# Patient Record
Sex: Female | Born: 1965 | Race: Black or African American | Hispanic: No | Smoking: Never smoker
Health system: Southern US, Community
[De-identification: ages and names within clinical notes are randomized; demographics above are authoritative.]

## PROBLEM LIST (undated history)

## (undated) DIAGNOSIS — R011 Cardiac murmur, unspecified: Secondary | ICD-10-CM

## (undated) DIAGNOSIS — N6452 Nipple discharge: Secondary | ICD-10-CM

## (undated) DIAGNOSIS — I10 Essential (primary) hypertension: Secondary | ICD-10-CM

## (undated) DIAGNOSIS — C50919 Malignant neoplasm of unspecified site of unspecified female breast: Secondary | ICD-10-CM

## (undated) DIAGNOSIS — E119 Type 2 diabetes mellitus without complications: Secondary | ICD-10-CM

## (undated) DIAGNOSIS — L509 Urticaria, unspecified: Secondary | ICD-10-CM

## (undated) DIAGNOSIS — J301 Allergic rhinitis due to pollen: Secondary | ICD-10-CM

## (undated) DIAGNOSIS — E7800 Pure hypercholesterolemia, unspecified: Secondary | ICD-10-CM

## (undated) HISTORY — DX: Pure hypercholesterolemia, unspecified: E78.00

## (undated) HISTORY — DX: Essential (primary) hypertension: I10

## (undated) HISTORY — DX: Type 2 diabetes mellitus without complications: E11.9

## (undated) HISTORY — DX: Allergic rhinitis due to pollen: J30.1

## (undated) HISTORY — DX: Urticaria, unspecified: L50.9

## (undated) HISTORY — PX: WISDOM TOOTH EXTRACTION: SHX21

## (undated) HISTORY — DX: Malignant neoplasm of unspecified site of unspecified female breast: C50.919

---

## 1997-08-19 ENCOUNTER — Inpatient Hospital Stay (HOSPITAL_COMMUNITY): Admission: AD | Admit: 1997-08-19 | Discharge: 1997-08-24 | Payer: Self-pay | Admitting: Obstetrics and Gynecology

## 1998-11-08 ENCOUNTER — Other Ambulatory Visit: Admission: RE | Admit: 1998-11-08 | Discharge: 1998-11-08 | Payer: Self-pay | Admitting: Obstetrics & Gynecology

## 1999-11-12 ENCOUNTER — Other Ambulatory Visit: Admission: RE | Admit: 1999-11-12 | Discharge: 1999-11-12 | Payer: Self-pay | Admitting: Obstetrics & Gynecology

## 2000-11-28 ENCOUNTER — Other Ambulatory Visit: Admission: RE | Admit: 2000-11-28 | Discharge: 2000-11-28 | Payer: Self-pay | Admitting: Obstetrics & Gynecology

## 2001-12-16 ENCOUNTER — Other Ambulatory Visit: Admission: RE | Admit: 2001-12-16 | Discharge: 2001-12-16 | Payer: Self-pay | Admitting: Obstetrics & Gynecology

## 2002-12-24 ENCOUNTER — Other Ambulatory Visit: Admission: RE | Admit: 2002-12-24 | Discharge: 2002-12-24 | Payer: Self-pay | Admitting: Obstetrics & Gynecology

## 2004-01-16 ENCOUNTER — Other Ambulatory Visit: Admission: RE | Admit: 2004-01-16 | Discharge: 2004-01-16 | Payer: Self-pay | Admitting: Obstetrics & Gynecology

## 2005-01-16 ENCOUNTER — Other Ambulatory Visit: Admission: RE | Admit: 2005-01-16 | Discharge: 2005-01-16 | Payer: Self-pay | Admitting: Obstetrics & Gynecology

## 2014-07-07 ENCOUNTER — Ambulatory Visit: Payer: Self-pay

## 2014-07-14 ENCOUNTER — Ambulatory Visit: Payer: Self-pay

## 2014-07-21 ENCOUNTER — Ambulatory Visit: Payer: Self-pay

## 2014-07-28 ENCOUNTER — Ambulatory Visit: Payer: Self-pay

## 2014-08-04 ENCOUNTER — Ambulatory Visit: Payer: Self-pay

## 2014-08-09 ENCOUNTER — Encounter: Payer: BC Managed Care – PPO | Attending: Family Medicine

## 2014-08-09 VITALS — Ht 66.5 in | Wt 156.6 lb

## 2014-08-09 DIAGNOSIS — Z713 Dietary counseling and surveillance: Secondary | ICD-10-CM | POA: Diagnosis not present

## 2014-08-09 DIAGNOSIS — IMO0002 Reserved for concepts with insufficient information to code with codable children: Secondary | ICD-10-CM

## 2014-08-09 DIAGNOSIS — E1165 Type 2 diabetes mellitus with hyperglycemia: Secondary | ICD-10-CM

## 2014-08-09 DIAGNOSIS — E118 Type 2 diabetes mellitus with unspecified complications: Secondary | ICD-10-CM | POA: Diagnosis not present

## 2014-08-09 NOTE — Progress Notes (Signed)

## 2014-08-11 ENCOUNTER — Ambulatory Visit: Payer: Self-pay

## 2014-08-16 DIAGNOSIS — IMO0002 Reserved for concepts with insufficient information to code with codable children: Secondary | ICD-10-CM

## 2014-08-16 DIAGNOSIS — E118 Type 2 diabetes mellitus with unspecified complications: Secondary | ICD-10-CM | POA: Diagnosis not present

## 2014-08-16 DIAGNOSIS — E1165 Type 2 diabetes mellitus with hyperglycemia: Secondary | ICD-10-CM

## 2014-08-16 NOTE — Progress Notes (Signed)

## 2014-08-23 DIAGNOSIS — E118 Type 2 diabetes mellitus with unspecified complications: Principal | ICD-10-CM

## 2014-08-23 DIAGNOSIS — IMO0002 Reserved for concepts with insufficient information to code with codable children: Secondary | ICD-10-CM

## 2014-08-23 DIAGNOSIS — E1165 Type 2 diabetes mellitus with hyperglycemia: Secondary | ICD-10-CM

## 2014-08-23 NOTE — Progress Notes (Signed)
Patient was seen on 08/23/14 for the third of a series of three diabetes self-management courses at the [facility].   Kari Torres the amount of activity recommended for healthy living . Describe activities suitable for individual needs . Identify ways to regularly incorporate activity into daily life . Identify barriers to activity and ways to over come these barriers  Identify diabetes medications being personally used and their primary action for lowering glucose and possible side effects . Describe role of stress on blood glucose and develop strategies to address psychosocial issues . Identify diabetes complications and ways to prevent them  Explain how to manage diabetes during illness . Evaluate success in meeting personal goal . Establish 2-3 goals that they will plan to diligently work on until they return for the  3-month follow-up visit  Goals:   I will be active 30 minutes or more 4 times a week  I will eat less unhealthy fats by eating less trans fats two or more meals a day  Your patient has identified these potential barriers to change:  None stated  Your patient has identified their diabetes self-care support plan as  Family Support On-line Resources Plan:  Attend Core 4 in 4 months

## 2014-08-30 ENCOUNTER — Ambulatory Visit: Payer: Self-pay

## 2015-06-22 DIAGNOSIS — I10 Essential (primary) hypertension: Secondary | ICD-10-CM

## 2015-06-22 DIAGNOSIS — E7800 Pure hypercholesterolemia, unspecified: Secondary | ICD-10-CM

## 2015-06-22 DIAGNOSIS — J301 Allergic rhinitis due to pollen: Secondary | ICD-10-CM | POA: Insufficient documentation

## 2015-06-22 DIAGNOSIS — IMO0001 Reserved for inherently not codable concepts without codable children: Secondary | ICD-10-CM | POA: Insufficient documentation

## 2015-06-22 DIAGNOSIS — E1165 Type 2 diabetes mellitus with hyperglycemia: Secondary | ICD-10-CM

## 2015-06-22 DIAGNOSIS — R079 Chest pain, unspecified: Secondary | ICD-10-CM | POA: Insufficient documentation

## 2015-06-23 ENCOUNTER — Ambulatory Visit (INDEPENDENT_AMBULATORY_CARE_PROVIDER_SITE_OTHER): Payer: BC Managed Care – PPO | Admitting: Cardiovascular Disease

## 2015-06-23 ENCOUNTER — Encounter: Payer: Self-pay | Admitting: Cardiovascular Disease

## 2015-06-23 VITALS — BP 126/100 | HR 60 | Ht 66.5 in | Wt 168.8 lb

## 2015-06-23 DIAGNOSIS — I10 Essential (primary) hypertension: Secondary | ICD-10-CM | POA: Diagnosis not present

## 2015-06-23 DIAGNOSIS — E7800 Pure hypercholesterolemia, unspecified: Secondary | ICD-10-CM | POA: Diagnosis not present

## 2015-06-23 DIAGNOSIS — R0789 Other chest pain: Secondary | ICD-10-CM | POA: Diagnosis not present

## 2015-06-23 DIAGNOSIS — E785 Hyperlipidemia, unspecified: Secondary | ICD-10-CM | POA: Diagnosis not present

## 2015-06-23 NOTE — Progress Notes (Signed)
Cardiology Office Note   Date:  06/23/2015   ID:  Kari Torres, DOB [birth year], MRN [id number]  PCP:  Donnie Coffin, MD  Cardiologist:   Acie Fredrickson Wonda Cheng, MD   Chief Complaint  Patient presents with  . New Patient (Initial Visit)    CHEST PAIN HAS BEEN BETTER, NO SOB AND NO SWELLING. RECENT EKG IN NOTES.   Problem list 1. Left chest/shoulder pain 2. Diabetes mellitus 3. Essential hypertension 4. Mild mitral regurgitation 5. Hyperlipidemia   History of Present Illness: Kari Torres is a 50 y.o. female who presents for evaluation of chest pain  Hx of DM 3-4 weeks of left shoulder pain  Burning,  Squeezing sensation . Not associated with dyspnea.  May radiate through to her back , possible pain in left upper arm  Throbbing up into her neck.   Does not get worse with exercise. Last week became more constant.  Does not seem to be related to movement   Typically exercises regularly , has not exercised this week due to the pain . 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 Diabetes mellitus without complication ([place])   . Hypertension   . Allergic rhinitis due to pollen   . Chest pain   . Hypercholesterolemia     Past Surgical History  Procedure Laterality Date  . Cesarean section    . Wisdom tooth extraction       Current Outpatient Prescriptions  Medication Sig Dispense Refill  . aspirin 81 MG tablet Take 81 mg by mouth daily.    Marland Kitchen atenolol (TENORMIN) 100 MG tablet Take 100 mg by mouth daily.    Marland Kitchen atorvastatin (LIPITOR) 20 MG tablet Take 20 mg by mouth daily.    Marland Kitchen conjugated estrogens (PREMARIN) vaginal cream Place 1 Applicatorful vaginally once a week.    . diltiazem (DILACOR XR) 240 MG 24 hr capsule Take 240 mg by mouth daily.    . hydrochlorothiazide (HYDRODIURIL) 25 MG tablet Take 25 mg by mouth daily.    Marland Kitchen levonorgestrel (MIRENA) 20 MCG/24HR IUD 1 each by Intrauterine route once.    .  loratadine (CLARITIN) 10 MG tablet Take 10 mg by mouth daily as needed for allergies.    . metFORMIN (GLUCOPHAGE) 500 MG tablet Take 500 mg by mouth 2 (two) times daily with a meal.    . Multiple Vitamins-Minerals (CENTRUM ADULTS PO) Take 1 tablet by mouth daily.     No current facility-administered medications for this visit.    Allergies:   Food    Social History:  The patient  reports that she has never smoked. She has never used smokeless tobacco. She reports that she drinks alcohol. She reports that she does not use illicit drugs.   Family History:  The patient's family history includes Breast cancer in her mother; CAD in her father; Congestive Heart Failure in her maternal grandmother; Diabetes in her father, maternal grandmother, paternal grandfather, and paternal grandmother; Healthy in her brother, brother, brother, brother, and sister; Hypertension in her father, maternal grandmother, and mother.    ROS:  Please see the history of present illness.    Review of Systems: Constitutional:  denies fever, chills, diaphoresis, appetite change and fatigue.  HEENT: denies photophobia, eye pain, redness, hearing loss, ear pain, congestion, sore throat, rhinorrhea, sneezing, neck pain, neck stiffness and tinnitus.  Respiratory: denies SOB, DOE, cough, chest tightness, and wheezing.  Cardiovascular: admits to chest pain and palpitations  , denies  leg swelling.  Gastrointestinal: denies nausea, vomiting, abdominal pain, diarrhea, constipation, blood in stool.  Genitourinary: denies dysuria, urgency, frequency, hematuria, flank pain and difficulty urinating.  Musculoskeletal: denies  myalgias, back pain, joint swelling, arthralgias and gait problem.   Skin: denies pallor, rash and wound.  Neurological: denies dizziness, seizures, syncope, weakness, light-headedness, numbness and headaches.   Hematological: denies adenopathy, easy bruising, personal or family bleeding history.   Psychiatric/ Behavioral: denies suicidal ideation, mood changes, confusion, nervousness, sleep disturbance and agitation.       All other systems are reviewed and negative.    PHYSICAL EXAM: VS:  BP 126/100 mmHg  Pulse 60  Ht 5' 6.5" (1.689 m)  Wt 168 lb 12.8 oz (76.567 kg)  BMI 26.84 kg/m2 , BMI Body mass index is 26.84 kg/(m^2). GEN: Well nourished, well developed, in no acute distress HEENT: normal Neck: no JVD, carotid bruits, or masses Cardiac: RRR; no murmurs, rubs, or gallops,no edema  Respiratory:  clear to auscultation bilaterally, normal work of breathing GI: soft, nontender, nondistended, + BS MS: no deformity or atrophy Skin: warm and dry, no rash Neuro:  Strength and sensation are intact Psych: normal   EKG:  EKG is not ordered today. The ekg ordered 06/19/15 at Dr. Virgilio Belling office  demonstrates NSR at 54.   No ST or T wave changes.    Recent Labs: No results found for requested labs within last 365 days.    Lipid Panel No results found for: CHOL, TRIG, HDL, CHOLHDL, VLDL, LDLCALC, LDLDIRECT    Wt Readings from Last 3 Encounters:  06/23/15 168 lb 12.8 oz (76.567 kg)  08/09/14 156 lb 9.6 oz (71.033 kg)      Other studies Reviewed: Additional studies/ records that were reviewed today include: . Review of the above records demonstrates:    ASSESSMENT AND PLAN:  1.  Chest pain: Kari Torres presents with some episodes of chest discomfort. The episodes have some typical and atypical features. She describes them as a tightness or squeezing sensation. It occurs at various times and seems to be worsening this past week. It does not radiate and it is not associate with shortness of breath. She has several risk factors for coronary artery disease including hyperlipidemia, hypertension, and type 2 diabetes mellitus. Given these risk factors and her chest discomfort I think that we should proceed with a stress Myoview study. If the Myoview study is abnormal then we  will need to proceed with cardiac catheterization. If the Myoview study is normal, I will see her on an as-needed basis.   Current medicines are reviewed at length with the patient today.  The patient does not have concerns regarding medicines.  The following changes have been made:  no change  Labs/ tests ordered today include:  No orders of the defined types were placed in this encounter.     Disposition:   FU with me as needed.      Kari Torres, Wonda Cheng, MD  06/23/2015 10:10 AM    [facility] [address], [place], [place]  [address] Phone: [phone]; Fax: [phone]   [facility]  [address] [place], [place]  [address] [phone]   Fax [phone]

## 2015-06-23 NOTE — Patient Instructions (Signed)
Medication Instructions:  Your physician recommends that you continue on your current medications as directed. Please refer to the Current Medication list given to you today.   Labwork: None Ordered   Testing/Procedures: Your physician has requested that you have an exercise stress myoview. For further information please visit [email or web address]. Please follow instruction sheet, as given.   Follow-Up: Your physician recommends that you schedule a follow-up appointment in: as needed with Dr. Nahser.    If you need a refill on your cardiac medications before your next appointment, please call your pharmacy.   Thank you for choosing [facility]! Caedence Snowden, RN [phone]    

## 2015-07-14 ENCOUNTER — Encounter (HOSPITAL_COMMUNITY): Payer: BC Managed Care – PPO

## 2015-07-17 ENCOUNTER — Telehealth (HOSPITAL_COMMUNITY): Payer: Self-pay | Admitting: *Deleted

## 2015-07-17 ENCOUNTER — Other Ambulatory Visit: Payer: Self-pay | Admitting: Radiology

## 2015-07-17 NOTE — Telephone Encounter (Signed)
Left message on voicemail in reference to upcoming appointment scheduled for 07/19/15. Phone number given for a call back so details instructions can be given. Kari Torres, [name]

## 2015-07-19 ENCOUNTER — Encounter (HOSPITAL_COMMUNITY): Payer: BC Managed Care – PPO

## 2015-07-20 ENCOUNTER — Other Ambulatory Visit: Payer: Self-pay | Admitting: General Surgery

## 2015-07-20 DIAGNOSIS — N6320 Unspecified lump in the left breast, unspecified quadrant: Secondary | ICD-10-CM

## 2015-07-25 ENCOUNTER — Telehealth (HOSPITAL_COMMUNITY): Payer: Self-pay | Admitting: *Deleted

## 2015-07-25 NOTE — Telephone Encounter (Signed)
Left message on voicemail in reference to upcoming appointment scheduled for 07/28/15. Phone number given for a call back so details instructions can be given. Hubbard Robinson, RN

## 2015-07-28 ENCOUNTER — Ambulatory Visit (HOSPITAL_COMMUNITY): Payer: BC Managed Care – PPO | Attending: Cardiology

## 2015-07-28 ENCOUNTER — Encounter (HOSPITAL_COMMUNITY): Payer: BC Managed Care – PPO

## 2015-07-28 DIAGNOSIS — R0789 Other chest pain: Secondary | ICD-10-CM | POA: Insufficient documentation

## 2015-07-28 DIAGNOSIS — R9439 Abnormal result of other cardiovascular function study: Secondary | ICD-10-CM | POA: Insufficient documentation

## 2015-07-28 DIAGNOSIS — I10 Essential (primary) hypertension: Secondary | ICD-10-CM | POA: Diagnosis not present

## 2015-07-28 DIAGNOSIS — E119 Type 2 diabetes mellitus without complications: Secondary | ICD-10-CM | POA: Insufficient documentation

## 2015-07-28 LAB — MYOCARDIAL PERFUSION IMAGING
CHL CUP MPHR: 171 {beats}/min
CHL CUP NUCLEAR SDS: 1
CHL CUP RESTING HR STRESS: 67 {beats}/min
CSEPED: 10 min
CSEPEDS: 40 s
CSEPEW: 12.8 METS
LV sys vol: 44 mL
LVDIAVOL: 103 mL (ref 46–106)
Peak HR: 162 {beats}/min
Percent HR: 94 %
RATE: 0.11
SRS: 2
SSS: 3
TID: 1.08

## 2015-07-28 MED ORDER — TECHNETIUM TC 99M SESTAMIBI GENERIC - CARDIOLITE
32.3000 | Freq: Once | INTRAVENOUS | Status: AC | PRN
  Administered 2015-07-28: 32.3 via INTRAVENOUS

## 2015-07-28 MED ORDER — TECHNETIUM TC 99M SESTAMIBI GENERIC - CARDIOLITE
10.2000 | Freq: Once | INTRAVENOUS | Status: AC | PRN
  Administered 2015-07-28: 10 via INTRAVENOUS

## 2015-08-02 ENCOUNTER — Encounter (HOSPITAL_COMMUNITY): Payer: BC Managed Care – PPO

## 2015-09-07 ENCOUNTER — Encounter (HOSPITAL_BASED_OUTPATIENT_CLINIC_OR_DEPARTMENT_OTHER): Payer: Self-pay | Admitting: *Deleted

## 2015-09-08 ENCOUNTER — Encounter (HOSPITAL_BASED_OUTPATIENT_CLINIC_OR_DEPARTMENT_OTHER)
Admission: RE | Admit: 2015-09-08 | Discharge: 2015-09-08 | Disposition: A | Discharge status: Home / Self Care | Payer: BC Managed Care – PPO | Source: Ambulatory Visit | Attending: General Surgery | Admitting: General Surgery

## 2015-09-08 DIAGNOSIS — I10 Essential (primary) hypertension: Secondary | ICD-10-CM | POA: Insufficient documentation

## 2015-09-08 DIAGNOSIS — Z01812 Encounter for preprocedural laboratory examination: Secondary | ICD-10-CM | POA: Diagnosis not present

## 2015-09-08 DIAGNOSIS — E119 Type 2 diabetes mellitus without complications: Secondary | ICD-10-CM | POA: Insufficient documentation

## 2015-09-08 LAB — BASIC METABOLIC PANEL
ANION GAP: 8 (ref 5–15)
BUN: 12 mg/dL (ref 6–20)
CALCIUM: 10.2 mg/dL (ref 8.9–10.3)
CO2: 29 mmol/L (ref 22–32)
CREATININE: 0.97 mg/dL (ref 0.44–1.00)
Chloride: 100 mmol/L — ABNORMAL LOW (ref 101–111)
Glucose, Bld: 129 mg/dL — ABNORMAL HIGH (ref 65–99)
Potassium: 4.2 mmol/L (ref 3.5–5.1)
SODIUM: 137 mmol/L (ref 135–145)

## 2015-09-14 NOTE — H&P (Signed)
Kari Torres  Location: [facility] Patient #: [phone] DOB: 03-May-1965 Married / Language: English / Race: Black or African American Female     History: Patient words: left breast eval.  The patient is a 50 year old female who presents with a breast mass. This is a 50 year old female, referred to me by Dr. Marcelo Baldy at [facility] mammography for surgical evaluation of complex sclerosing lesion, possible radial scar left breast, 1 o'clock position. Her PCP is Dr. Donnie Coffin. Her cardiologist is Dr. Liam Rogers.  The patient has had almost no breast problems in the past, a couple of cysts have been aspirated. She has no breast pain, proceed mass, or nipple discharge. She has been followed because of the calcifications in the left breast upper outer quadrant. Dr. Joanell Rising thought these were more prominent lately and she underwent image guided biopsy of the left breast calcifications at 1:00. Final pathology shows complex sclerosing lesion, fibrocystic changes, adenosis, possible radial scar.  Family history reveals breast cancer in her mother at age 3. She presented with stage IV disease and died a year and a half later. No other breast or ovarian or pancreatic cancer in the family.  Coronaries include hypertension, diabetes, mild mitral regurgitation, hyperlipidemia, and recent evaluation for atypical left chest pain. She says Dr. nausea. This was bursitis but is getting a Myoview on May 10  Social -  she is married her husband is with her they have 2 children no tobacco she is an Optometrist for [employer].  We had a lengthy discussion. She would like this area excised because of the low but definite risk of in situ cancer and more IMPORTANTLY because her mother died of breast cancer. I think that is very reasonable and we are going to proceed once we get cardiac clearance. I discussed the indications, details, techniques, and numerous risk of left breast lumpectomy with  radioactive seed localization. She understands the risk of bleeding, infection, nerve damage chronic pain, cosmetic deformity, reoperation if this is cancer, and other unforeseen problems. She understands all these issues. All of her questions are answered. She agrees with this plan.   Other Problems  Diabetes Mellitus Heart murmur Hemorrhoids High blood pressure  Past Surgical History  Breast Biopsy Left. Cesarean Section - 1 Oral Surgery  Diagnostic Studies History Colonoscopy never Mammogram within last year Pap Smear 1-5 years ago  Allergies  Shellfish-derived Products  Medication History  Atenolol (100MG  Tablet, Oral) Active. Atorvastatin Calcium (20MG  Tablet, Oral) Active. DiltiaZEM HCl ER Coated Beads (240MG  Capsule ER 24HR, Oral) Active. HydroCHLOROthiazide (25MG  Tablet, Oral) Active. MetFORMIN HCl (500MG  Tablet, Oral) Active. Premarin (0.625MG /GM Cream, Vaginal) Active. Triamcinolone Acetonide (0.1% Ointment, External) Active. Medications Reconciled   Social History  Alcohol use Moderate alcohol use. Caffeine use Coffee. No drug use Tobacco use Never smoker.  Family History  Breast Cancer Mother. Diabetes Mellitus Father. Heart Disease Father. Heart disease in female family member before age 40 Hypertension Father, Mother. Kidney Disease Father.  Pregnancy / Birth History  Age at menarche 84 years. Age of menopause 80-50 Contraceptive History Intrauterine device, Oral contraceptives. Gravida 2 Irregular periods Maternal age 78-30 Para 2    Review of Systems  General Present- Night Sweats. Not Present- Appetite Loss, Chills, Fatigue, Fever, Weight Gain and Weight Loss. Skin Not Present- Change in Wart/Mole, Dryness, Hives, Jaundice, New Lesions, Non-Healing Wounds, Rash and Ulcer. HEENT Present- Seasonal Allergies and Wears glasses/contact lenses. Not Present- Earache, Hearing Loss, Hoarseness, Nose Bleed, Oral  Ulcers, Ringing in the Ears, Sinus Pain, Sore Throat, Visual Disturbances and Yellow Eyes. Respiratory Not Present- Bloody sputum, Chronic Cough, Difficulty Breathing, Snoring and Wheezing. Breast Not Present- Breast Mass, Breast Pain, Nipple Discharge and Skin Changes. Cardiovascular Not Present- Chest Pain, Difficulty Breathing Lying Down, Leg Cramps, Palpitations, Rapid Heart Rate, Shortness of Breath and Swelling of Extremities. Gastrointestinal Not Present- Abdominal Pain, Bloating, Bloody Stool, Change in Bowel Habits, Chronic diarrhea, Constipation, Difficulty Swallowing, Excessive gas, Gets full quickly at meals, Hemorrhoids, Indigestion, Nausea, Rectal Pain and Vomiting. Female Genitourinary Not Present- Frequency, Nocturia, Painful Urination, Pelvic Pain and Urgency. Musculoskeletal Not Present- Back Pain, Joint Pain, Joint Stiffness, Muscle Pain, Muscle Weakness and Swelling of Extremities. Neurological Not Present- Decreased Memory, Fainting, Headaches, Numbness, Seizures, Tingling, Tremor, Trouble walking and Weakness. Psychiatric Not Present- Anxiety, Bipolar, Change in Sleep Pattern, Depression, Fearful and Frequent crying. Endocrine Present- Hot flashes and New Diabetes. Not Present- Cold Intolerance, Excessive Hunger, Hair Changes and Heat Intolerance. Hematology Not Present- Easy Bruising, Excessive bleeding, Gland problems, HIV and Persistent Infections.  Vitals  Weight: 166 lb Height: 66in Body Surface Area: 1.85 m Body Mass Index: 26.79 kg/m  Temp.: 97.29F(Temporal)  Pulse: 76 (Regular)  BP: 128/80 (Sitting, Left Arm, Standard)    Physical Exam  General Mental Status-Alert. General Appearance-Consistent with stated age. Hydration-Well hydrated. Voice-Normal.  Head and Neck Head-normocephalic, atraumatic with no lesions or palpable masses. Trachea-midline. Thyroid Gland Characteristics - normal size and consistency.  Eye Eyeball -  Bilateral-Extraocular movements intact. Sclera/Conjunctiva - Bilateral-No scleral icterus.  Chest and Lung Exam Chest and lung exam reveals -quiet, even and easy respiratory effort with no use of accessory muscles and on auscultation, normal breath sounds, no adventitious sounds and normal vocal resonance. Inspection Chest Wall - Normal. Back - normal.  Breast Note: Both breasts are examined. Medium size. Soft. Skin healthy without changes. Biopsy site superior left breast without hematoma or ecchymoses. No palpable masses in either breast. No axillary or supraclavicular adenopathy.   Cardiovascular Cardiovascular examination reveals -normal heart sounds, regular rate and rhythm with no murmurs and normal pedal pulses bilaterally.  Abdomen Inspection Inspection of the abdomen reveals - No Hernias. Palpation/Percussion Palpation and Percussion of the abdomen reveal - Soft, Non Tender, No Rebound tenderness, No Rigidity (guarding) and No hepatosplenomegaly. Auscultation Auscultation of the abdomen reveals - Bowel sounds normal. Note: Pfannenstiel incision from previous C-section.   Neurologic Neurologic evaluation reveals -alert and oriented x 3 with no impairment of recent or remote memory. Mental Status-Normal.  Musculoskeletal Normal Exam - Left-Upper Extremity Strength Normal and Lower Extremity Strength Normal. Normal Exam - Right-Upper Extremity Strength Normal and Lower Extremity Strength Normal.  Lymphatic Head & Neck  General Head & Neck Lymphatics: Bilateral - Description - Normal. Axillary  General Axillary Region: Bilateral - Description - Normal. Tenderness - Non Tender. Femoral & Inguinal  Generalized Femoral & Inguinal Lymphatics: Bilateral - Description - Normal. Tenderness - Non Tender.    Assessment & Plan  MASS OF LEFT BREAST ON MAMMOGRAM (N63)   Your recent imaging studies and biopsy showed a complex sclerosing lesion, possible  radial scar You were referred for a surgical opinion because there is a low but definite risk of in situ cancer. Probably about 5% Because of your family history of breast cancer in your mother, we have decided to go ahead with a conservative lumpectomy to be sure there is not something more suspicious in the breast I have discussed indications, techniques, and numerous risk of the  surgery with you and your husband  We will schedule this surgery once we know the results of YOUR stress Myoview test on May 10.  FAMILY HISTORY OF BREAST CANCER IN FIRST DEGREE RELATIVE (Z80.3) TYPE 2 DIABETES MELLITUS TREATED WITHOUT INSULIN (E11.9) ESSENTIAL HYPERTENSION (I10) ATYPICAL CHEST PAIN (R07.89) Impression: Has seen Dr. nausea. Felt to be atypical. Stress Myoview scheduled for May 10.   Edsel Petrin. Dalbert Batman, M.D., [facility] General and Minimally invasive Surgery Breast and Colorectal Surgery Office:   [phone] Pager:   [phone]

## 2015-09-15 ENCOUNTER — Ambulatory Visit (HOSPITAL_BASED_OUTPATIENT_CLINIC_OR_DEPARTMENT_OTHER)
Admission: RE | Admit: 2015-09-15 | Discharge: 2015-09-15 | Disposition: A | Discharge status: Home / Self Care | Payer: BC Managed Care – PPO | Source: Ambulatory Visit | Attending: General Surgery | Admitting: General Surgery

## 2015-09-15 ENCOUNTER — Encounter (HOSPITAL_BASED_OUTPATIENT_CLINIC_OR_DEPARTMENT_OTHER)
Admission: RE | Disposition: A | Discharge status: Home / Self Care | Payer: Self-pay | Source: Ambulatory Visit | Attending: General Surgery

## 2015-09-15 ENCOUNTER — Ambulatory Visit (HOSPITAL_BASED_OUTPATIENT_CLINIC_OR_DEPARTMENT_OTHER): Payer: BC Managed Care – PPO | Admitting: Anesthesiology

## 2015-09-15 ENCOUNTER — Encounter (HOSPITAL_BASED_OUTPATIENT_CLINIC_OR_DEPARTMENT_OTHER): Payer: Self-pay | Admitting: Anesthesiology

## 2015-09-15 DIAGNOSIS — R928 Other abnormal and inconclusive findings on diagnostic imaging of breast: Secondary | ICD-10-CM

## 2015-09-15 DIAGNOSIS — Z79899 Other long term (current) drug therapy: Secondary | ICD-10-CM | POA: Insufficient documentation

## 2015-09-15 DIAGNOSIS — R011 Cardiac murmur, unspecified: Secondary | ICD-10-CM | POA: Insufficient documentation

## 2015-09-15 DIAGNOSIS — E119 Type 2 diabetes mellitus without complications: Secondary | ICD-10-CM | POA: Insufficient documentation

## 2015-09-15 DIAGNOSIS — N6012 Diffuse cystic mastopathy of left breast: Secondary | ICD-10-CM | POA: Diagnosis not present

## 2015-09-15 DIAGNOSIS — I10 Essential (primary) hypertension: Secondary | ICD-10-CM | POA: Insufficient documentation

## 2015-09-15 DIAGNOSIS — N6032 Fibrosclerosis of left breast: Secondary | ICD-10-CM | POA: Insufficient documentation

## 2015-09-15 DIAGNOSIS — Z7984 Long term (current) use of oral hypoglycemic drugs: Secondary | ICD-10-CM | POA: Insufficient documentation

## 2015-09-15 DIAGNOSIS — E785 Hyperlipidemia, unspecified: Secondary | ICD-10-CM | POA: Insufficient documentation

## 2015-09-15 DIAGNOSIS — I340 Nonrheumatic mitral (valve) insufficiency: Secondary | ICD-10-CM | POA: Insufficient documentation

## 2015-09-15 DIAGNOSIS — D242 Benign neoplasm of left breast: Secondary | ICD-10-CM | POA: Diagnosis not present

## 2015-09-15 DIAGNOSIS — Z91013 Allergy to seafood: Secondary | ICD-10-CM | POA: Diagnosis not present

## 2015-09-15 DIAGNOSIS — N6022 Fibroadenosis of left breast: Secondary | ICD-10-CM | POA: Diagnosis not present

## 2015-09-15 DIAGNOSIS — Z803 Family history of malignant neoplasm of breast: Secondary | ICD-10-CM | POA: Insufficient documentation

## 2015-09-15 HISTORY — PX: BREAST LUMPECTOMY WITH RADIOACTIVE SEED LOCALIZATION: SHX6424

## 2015-09-15 LAB — GLUCOSE, CAPILLARY
GLUCOSE-CAPILLARY: 150 mg/dL — AB (ref 65–99)
Glucose-Capillary: 136 mg/dL — ABNORMAL HIGH (ref 65–99)

## 2015-09-15 SURGERY — BREAST LUMPECTOMY WITH RADIOACTIVE SEED LOCALIZATION
Anesthesia: General | Site: Breast | Laterality: Left

## 2015-09-15 MED ORDER — CHLORHEXIDINE GLUCONATE 4 % EX LIQD: 1.0000 "application " | Freq: Once | CUTANEOUS | Status: DC

## 2015-09-15 MED ORDER — FENTANYL CITRATE (PF) 100 MCG/2ML IJ SOLN
50.0000 ug | INTRAMUSCULAR | Status: AC | PRN
  Administered 2015-09-15 (×2): 25 ug via INTRAVENOUS
  Administered 2015-09-15: 100 ug via INTRAVENOUS

## 2015-09-15 MED ORDER — LACTATED RINGERS IV SOLN
INTRAVENOUS | Status: DC
  Administered 2015-09-15 (×2): via INTRAVENOUS

## 2015-09-15 MED ORDER — LIDOCAINE 2% (20 MG/ML) 5 ML SYRINGE: INTRAMUSCULAR | Status: DC | PRN

## 2015-09-15 MED ORDER — LIDOCAINE 2% (20 MG/ML) 5 ML SYRINGE
INTRAMUSCULAR | Status: AC
  Filled 2015-09-15: qty 5

## 2015-09-15 MED ORDER — MIDAZOLAM HCL 2 MG/2ML IJ SOLN
1.0000 mg | INTRAMUSCULAR | Status: DC | PRN
  Administered 2015-09-15: 2 mg via INTRAVENOUS

## 2015-09-15 MED ORDER — ONDANSETRON HCL 4 MG/2ML IJ SOLN
INTRAMUSCULAR | Status: DC | PRN
  Administered 2015-09-15: 4 mg via INTRAVENOUS

## 2015-09-15 MED ORDER — MIDAZOLAM HCL 2 MG/2ML IJ SOLN
INTRAMUSCULAR | Status: AC
  Filled 2015-09-15: qty 2

## 2015-09-15 MED ORDER — CEFAZOLIN SODIUM-DEXTROSE 2-4 GM/100ML-% IV SOLN
2.0000 g | INTRAVENOUS | Status: AC
  Administered 2015-09-15: 2 g via INTRAVENOUS

## 2015-09-15 MED ORDER — CEFAZOLIN SODIUM-DEXTROSE 2-4 GM/100ML-% IV SOLN
INTRAVENOUS | Status: AC
  Filled 2015-09-15: qty 100

## 2015-09-15 MED ORDER — SCOPOLAMINE 1 MG/3DAYS TD PT72
1.0000 | MEDICATED_PATCH | Freq: Once | TRANSDERMAL | Status: DC | PRN
Start: 2015-09-15 — End: 2015-09-15

## 2015-09-15 MED ORDER — MEPERIDINE HCL 25 MG/ML IJ SOLN: 6.2500 mg | INTRAMUSCULAR | Status: DC | PRN

## 2015-09-15 MED ORDER — ONDANSETRON HCL 4 MG/2ML IJ SOLN
INTRAMUSCULAR | Status: AC
  Filled 2015-09-15: qty 2

## 2015-09-15 MED ORDER — PROPOFOL 500 MG/50ML IV EMUL
INTRAVENOUS | Status: AC
  Filled 2015-09-15: qty 50

## 2015-09-15 MED ORDER — DEXAMETHASONE SODIUM PHOSPHATE 4 MG/ML IJ SOLN
INTRAMUSCULAR | Status: DC | PRN
  Administered 2015-09-15: 5 mg via INTRAVENOUS

## 2015-09-15 MED ORDER — BUPIVACAINE-EPINEPHRINE (PF) 0.5% -1:200000 IJ SOLN
INTRAMUSCULAR | Status: DC | PRN
  Administered 2015-09-15: 10 mL

## 2015-09-15 MED ORDER — FENTANYL CITRATE (PF) 100 MCG/2ML IJ SOLN: 25.0000 ug | INTRAMUSCULAR | Status: DC | PRN

## 2015-09-15 MED ORDER — PROPOFOL 10 MG/ML IV BOLUS
INTRAVENOUS | Status: DC | PRN
  Administered 2015-09-15: 130 mg via INTRAVENOUS

## 2015-09-15 MED ORDER — HYDROCODONE-ACETAMINOPHEN 5-325 MG PO TABS: 1.0000 | ORAL_TABLET | Freq: Four times a day (QID) | ORAL | Status: DC | PRN

## 2015-09-15 MED ORDER — EPHEDRINE SULFATE 50 MG/ML IJ SOLN
INTRAMUSCULAR | Status: DC | PRN
  Administered 2015-09-15: 10 mg via INTRAVENOUS

## 2015-09-15 MED ORDER — LIDOCAINE HCL (CARDIAC) 20 MG/ML IV SOLN
INTRAVENOUS | Status: DC | PRN
  Administered 2015-09-15: 40 mg via INTRAVENOUS

## 2015-09-15 MED ORDER — FENTANYL CITRATE (PF) 100 MCG/2ML IJ SOLN
INTRAMUSCULAR | Status: AC
  Filled 2015-09-15: qty 2

## 2015-09-15 MED ORDER — PROMETHAZINE HCL 25 MG/ML IJ SOLN: 6.2500 mg | INTRAMUSCULAR | Status: DC | PRN

## 2015-09-15 MED ORDER — OXYCODONE HCL 5 MG PO TABS
5.0000 mg | ORAL_TABLET | Freq: Once | ORAL | Status: AC
  Administered 2015-09-15: 5 mg via ORAL

## 2015-09-15 MED ORDER — GLYCOPYRROLATE 0.2 MG/ML IJ SOLN: 0.2000 mg | Freq: Once | INTRAMUSCULAR | Status: DC | PRN

## 2015-09-15 MED ORDER — OXYCODONE HCL 5 MG PO TABS
ORAL_TABLET | ORAL | Status: AC
  Filled 2015-09-15: qty 1

## 2015-09-15 MED ORDER — BUPIVACAINE-EPINEPHRINE (PF) 0.5% -1:200000 IJ SOLN
INTRAMUSCULAR | Status: AC
  Filled 2015-09-15: qty 90

## 2015-09-15 MED ORDER — DEXAMETHASONE SODIUM PHOSPHATE 10 MG/ML IJ SOLN
INTRAMUSCULAR | Status: AC
  Filled 2015-09-15: qty 1

## 2015-09-15 MED ORDER — MIDAZOLAM HCL 2 MG/2ML IJ SOLN: 0.5000 mg | Freq: Once | INTRAMUSCULAR | Status: DC | PRN

## 2015-09-15 SURGICAL SUPPLY — 65 items
APPLIER CLIP 9.375 MED OPEN (MISCELLANEOUS) ×3
BENZOIN TINCTURE PRP APPL 2/3 (GAUZE/BANDAGES/DRESSINGS) IMPLANT
BINDER BREAST LRG (GAUZE/BANDAGES/DRESSINGS) ×3 IMPLANT
BINDER BREAST MEDIUM (GAUZE/BANDAGES/DRESSINGS) IMPLANT
BINDER BREAST XLRG (GAUZE/BANDAGES/DRESSINGS) IMPLANT
BINDER BREAST XXLRG (GAUZE/BANDAGES/DRESSINGS) IMPLANT
BLADE HEX COATED 2.75 (ELECTRODE) ×3 IMPLANT
BLADE SURG 10 STRL SS (BLADE) IMPLANT
BLADE SURG 15 STRL LF DISP TIS (BLADE) ×1 IMPLANT
BLADE SURG 15 STRL SS (BLADE) ×2
CANISTER SUC SOCK COL 7IN (MISCELLANEOUS) IMPLANT
CANISTER SUCT 1200ML W/VALVE (MISCELLANEOUS) ×3 IMPLANT
CHLORAPREP W/TINT 26ML (MISCELLANEOUS) ×3 IMPLANT
CLIP APPLIE 9.375 MED OPEN (MISCELLANEOUS) ×1 IMPLANT
CLOSURE WOUND 1/2 X4 (GAUZE/BANDAGES/DRESSINGS)
COVER BACK TABLE 60X90IN (DRAPES) ×3 IMPLANT
COVER MAYO STAND STRL (DRAPES) ×3 IMPLANT
COVER PROBE W GEL 5X96 (DRAPES) ×3 IMPLANT
DECANTER SPIKE VIAL GLASS SM (MISCELLANEOUS) IMPLANT
DERMABOND ADVANCED (GAUZE/BANDAGES/DRESSINGS) ×2
DERMABOND ADVANCED .7 DNX12 (GAUZE/BANDAGES/DRESSINGS) ×1 IMPLANT
DEVICE DUBIN W/COMP PLATE 8390 (MISCELLANEOUS) ×3 IMPLANT
DRAPE LAPAROSCOPIC ABDOMINAL (DRAPES) ×3 IMPLANT
DRAPE UTILITY XL STRL (DRAPES) ×3 IMPLANT
DRSG PAD ABDOMINAL 8X10 ST (GAUZE/BANDAGES/DRESSINGS) ×3 IMPLANT
ELECT REM PT RETURN 9FT ADLT (ELECTROSURGICAL) ×3
ELECTRODE REM PT RTRN 9FT ADLT (ELECTROSURGICAL) ×1 IMPLANT
GLOVE BIO SURGEON STRL SZ7 (GLOVE) ×3 IMPLANT
GLOVE BIOGEL PI IND STRL 7.0 (GLOVE) ×1 IMPLANT
GLOVE BIOGEL PI IND STRL 7.5 (GLOVE) ×2 IMPLANT
GLOVE BIOGEL PI INDICATOR 7.0 (GLOVE) ×2
GLOVE BIOGEL PI INDICATOR 7.5 (GLOVE) ×4
GLOVE EUDERMIC 7 POWDERFREE (GLOVE) ×3 IMPLANT
GLOVE SURG SS PI 7.5 STRL IVOR (GLOVE) ×3 IMPLANT
GOWN STRL REUS W/ TWL LRG LVL3 (GOWN DISPOSABLE) ×1 IMPLANT
GOWN STRL REUS W/ TWL XL LVL3 (GOWN DISPOSABLE) ×2 IMPLANT
GOWN STRL REUS W/TWL LRG LVL3 (GOWN DISPOSABLE) ×2
GOWN STRL REUS W/TWL XL LVL3 (GOWN DISPOSABLE) ×4
ILLUMINATOR WAVEGUIDE N/F (MISCELLANEOUS) IMPLANT
KIT MARKER MARGIN INK (KITS) ×3 IMPLANT
LIGHT WAVEGUIDE WIDE FLAT (MISCELLANEOUS) IMPLANT
NEEDLE HYPO 25X1 1.5 SAFETY (NEEDLE) ×3 IMPLANT
NS IRRIG 1000ML POUR BTL (IV SOLUTION) ×3 IMPLANT
PACK BASIN DAY SURGERY FS (CUSTOM PROCEDURE TRAY) ×3 IMPLANT
PENCIL BUTTON HOLSTER BLD 10FT (ELECTRODE) ×3 IMPLANT
SHEET MEDIUM DRAPE 40X70 STRL (DRAPES) IMPLANT
SLEEVE SCD COMPRESS KNEE MED (MISCELLANEOUS) ×3 IMPLANT
SPONGE GAUZE 4X4 12PLY STER LF (GAUZE/BANDAGES/DRESSINGS) IMPLANT
SPONGE LAP 18X18 X RAY DECT (DISPOSABLE) IMPLANT
SPONGE LAP 4X18 X RAY DECT (DISPOSABLE) ×6 IMPLANT
STRIP CLOSURE SKIN 1/2X4 (GAUZE/BANDAGES/DRESSINGS) IMPLANT
SUT ETHILON 3 0 FSL (SUTURE) IMPLANT
SUT MNCRL AB 4-0 PS2 18 (SUTURE) ×3 IMPLANT
SUT SILK 2 0 SH (SUTURE) ×3 IMPLANT
SUT VIC AB 2-0 CT1 27 (SUTURE)
SUT VIC AB 2-0 CT1 TAPERPNT 27 (SUTURE) IMPLANT
SUT VIC AB 3-0 SH 27 (SUTURE)
SUT VIC AB 3-0 SH 27X BRD (SUTURE) IMPLANT
SUT VICRYL 3-0 CR8 SH (SUTURE) ×3 IMPLANT
SYRINGE 10CC LL (SYRINGE) ×3 IMPLANT
TOWEL OR 17X24 6PK STRL BLUE (TOWEL DISPOSABLE) ×3 IMPLANT
TOWEL OR NON WOVEN STRL DISP B (DISPOSABLE) IMPLANT
TUBE CONNECTING 20'X1/4 (TUBING) ×1
TUBE CONNECTING 20X1/4 (TUBING) ×2 IMPLANT
YANKAUER SUCT BULB TIP NO VENT (SUCTIONS) ×3 IMPLANT

## 2015-09-15 NOTE — Op Note (Signed)
Patient Name:           Kari Torres   Date of Surgery:        09/15/2015  Pre op Diagnosis:      Abnormal mammogram left breast  Post op Diagnosis:    Abnormal mammogram left breast  Procedure:                 Left breast lumpectomy with radioactive seed localization  Surgeon:                     Edsel Petrin. Dalbert Batman, M.D., FACS  Assistant:                      OR staff  Operative Indications:    This is a 50 year old female, referred to me by Dr. Marcelo Baldy at [facility] mammography for surgical evaluation of complex sclerosing lesion, possible radial scar left breast, 1 o'clock position. Her PCP is Dr. Donnie Coffin. Her cardiologist is Dr. Liam Rogers.     The patient has had almost no breast problems in the past, a couple of cysts have been aspirated. She has no breast pain, proceed mass, or nipple discharge. She has been followed because of the calcifications in the left breast upper outer quadrant. Dr. Joanell Rising thought these were more prominent lately and she underwent image guided biopsy of the left breast calcifications at 1:00. Final pathology shows complex sclerosing lesion, fibrocystic changes, adenosis, possible radial scar.      Family history reveals breast cancer in her mother at age 26. She presented with stage IV disease and died a year and a half later. No other breast or ovarian or pancreatic cancer in the family.     Coronaries include hypertension, diabetes, mild mitral regurgitation, hyperlipidemia, and recent evaluation for atypical left chest pain. She says Dr. Acie Fredrickson evaluated her      We had a lengthy discussion. She would like this area excised because of the low but definite risk of in situ cancer and more IMPORTANTLY because her mother died of breast cancer. I think that is very reasonable and we are going to proceed .  The radioactive seed was placed by Dr. Marcelo Baldy yesterday.  He called me and stated that the seed was in the area of interest but that the original  biopsy clip had migrated several centimeters away.  Therefore are targeted area of interest is the radioactive seed and not the original biopsy clip. She agrees with this plan.  Operative Findings:       The radioactive seed was contained within the relative center of the specimen and this was felt to be an adequate excision.  Metal clips were placed in the biopsy cavity.  Procedure in Detail:          The patient was seen in the holding area and using the neoprobe I could hear the audible signal from the radioactive seed in the left breast, upper outer, central location.  The patient was taken to the operating room and underwent general LMA anesthesia.  Left breast was prepped and draped in a sterile fashion.  Surgical timeout was performed.  Intravenous antibiotics were given.  0.5% Marcaine with epinephrine was used as local infiltration anesthetic.      Using the neoprobe I found that the radioactive signal was only 1-2 cm. peripheral to the areolar margin.  I chose to make a circumareolar incision in the inferior lateral position.  I dissected down  into the breast with electrocautery and using the neoprobe frequently I dissected around the radioactive seed.  The specimen was removed and marked with silk sutures and a 6 color ink kit to orient the pathologist.  Specimen mammogram looked good as described above.  Metal clips were placed in the biopsy cavity.  Hemostasis was excellent and achieved with electrocautery.  The wound was irrigated and then closed with interrupted 3-0 Vicryl sutures for the breast tissue and a running subcuticular 4-0 Monocryl for the skin.  Dermabond was placed.  Breast binder was placed.  The patient tolerated the procedure well was taken to PACU in stable condition.  EBL 20 mL or less.  Counts correct.  Complications none.     Edsel Petrin. Dalbert Batman, M.D., FACS General and Minimally Invasive Surgery Breast and Colorectal Surgery  09/15/2015 8:24 AM

## 2015-09-15 NOTE — Discharge Instructions (Signed)

## 2015-09-15 NOTE — Anesthesia Postprocedure Evaluation (Signed)
Anesthesia Post Note  Patient: Kari Torres  Procedure(s) Performed: Procedure(s) (LRB): LEFT BREAST LUMPECTOMY WITH RADIOACTIVE SEED LOCALIZATION (Left)  Patient location during evaluation: PACU Anesthesia Type: General Level of consciousness: awake and alert, oriented and patient cooperative Pain management: pain level controlled Vital Signs Assessment: post-procedure vital signs reviewed and stable Respiratory status: spontaneous breathing, nonlabored ventilation and respiratory function stable Cardiovascular status: blood pressure returned to baseline and stable Postop Assessment: no signs of nausea or vomiting Anesthetic complications: no    Last Vitals:  Filed Vitals:   09/15/15 0831 09/15/15 0845  BP: 104/68 113/71  Pulse: 64 61  Temp: 36.4 C   Resp: 19 18    Last Pain: There were no vitals filed for this visit.               Midge Minium

## 2015-09-15 NOTE — Interval H&P Note (Signed)
History and Physical Interval Note:  09/15/2015 6:42 AM  Kari Torres  has presented today for surgery, with the diagnosis of left breast mass on mammography  The various methods of treatment have been discussed with the patient and family. After consideration of risks, benefits and other options for treatment, the patient has consented to  Procedure(s): LEFT BREAST LUMPECTOMY WITH RADIOACTIVE SEED LOCALIZATION (Left) as a surgical intervention .  The patient's history has been reviewed, patient examined, no change in status, stable for surgery.  I have reviewed the patient's chart and labs.  Questions were answered to the patient's satisfaction.     Adin Hector

## 2015-09-15 NOTE — Anesthesia Preprocedure Evaluation (Addendum)
Anesthesia Evaluation  Patient identified by MRN, date of birth, ID band Patient awake    Reviewed: Allergy & Precautions, NPO status , Patient's Chart, lab work & pertinent test results, reviewed documented beta blocker date and time   History of Anesthesia Complications Negative for: history of anesthetic complications  Airway Mallampati: I  TM Distance: >3 FB Neck ROM: Full    Dental  (+) Dental Advisory Given   Pulmonary neg pulmonary ROS,    breath sounds clear to auscultation       Cardiovascular hypertension, Pt. on medications and Pt. on home beta blockers (-) angina Rhythm:Regular Rate:Normal  5/17 Stress: EF 57%, no ischemia   Neuro/Psych negative neurological ROS  negative psych ROS   GI/Hepatic negative GI ROS, Neg liver ROS,   Endo/Other  diabetes (glu 136), Oral Hypoglycemic Agents  Renal/GU negative Renal ROS     Musculoskeletal   Abdominal   Peds  Hematology   Anesthesia Other Findings   Reproductive/Obstetrics                           Anesthesia Physical Anesthesia Plan  ASA: II  Anesthesia Plan: General   Post-op Pain Management:    Induction: Intravenous  Airway Management Planned: LMA  Additional Equipment:   Intra-op Plan:   Post-operative Plan:   Informed Consent: I have reviewed the patients History and Physical, chart, labs and discussed the procedure including the risks, benefits and alternatives for the proposed anesthesia with the patient or authorized representative who has indicated his/her understanding and acceptance.   Dental advisory given  Plan Discussed with: CRNA and Surgeon  Anesthesia Plan Comments: (Plan routine monitors, GA- LMA OK)        Anesthesia Quick Evaluation

## 2015-09-15 NOTE — Anesthesia Procedure Notes (Signed)
Procedure Name: LMA Insertion Date/Time: 09/15/2015 7:38 AM Performed by: Maryella Shivers Pre-anesthesia Checklist: Patient identified, Emergency Drugs available, Suction available and Patient being monitored Patient Re-evaluated:Patient Re-evaluated prior to inductionOxygen Delivery Method: Circle System Utilized Preoxygenation: Pre-oxygenation with 100% oxygen Intubation Type: IV induction Ventilation: Mask ventilation without difficulty LMA: LMA inserted LMA Size: 4.0 Number of attempts: 1 Airway Equipment and Method: bite block Placement Confirmation: positive ETCO2 Tube secured with: Tape Dental Injury: Teeth and Oropharynx as per pre-operative assessment

## 2015-09-15 NOTE — Transfer of Care (Signed)
Immediate Anesthesia Transfer of Care Note  Patient: Kari Torres  Procedure(s) Performed: Procedure(s): LEFT BREAST LUMPECTOMY WITH RADIOACTIVE SEED LOCALIZATION (Left)  Patient Location: PACU  Anesthesia Type:General  Level of Consciousness: sedated  Airway & Oxygen Therapy: Patient Spontanous Breathing and Patient connected to face mask oxygen  Post-op Assessment: Report given to RN and Post -op Vital signs reviewed and stable  Post vital signs: Reviewed and stable  Last Vitals:  Filed Vitals:   09/15/15 0634  BP: 153/90  Pulse: 68  Temp: 36.7 C  Resp: 18    Last Pain: There were no vitals filed for this visit.    Patients Stated Pain Goal: 0 ([phone] [id number])  Complications: No apparent anesthesia complications

## 2015-09-18 ENCOUNTER — Encounter (HOSPITAL_BASED_OUTPATIENT_CLINIC_OR_DEPARTMENT_OTHER): Payer: Self-pay | Admitting: General Surgery

## 2015-09-18 NOTE — Progress Notes (Signed)
Quick Note:  Inform patient of Pathology report,.tell her that the biopsy is benign. Fibrocystic change but no atypia or malignancy. Excellent news. I will discuss her pathology report with her in detail at her next office visit.  hmi ______

## 2016-06-05 IMAGING — NM NM MISC PROCEDURE
8 series · 48 of 48 positions shown · non-contrast
Comparison: none

[Series 1: wbr_r-proj_st rest · 6.51mm/px · 6 of 64 frames shown (1 of 2)]
[frame 6/64]
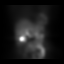
[frame 16/64]
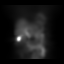
[frame 27/64]
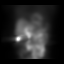
[frame 38/64]
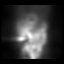
[frame 48/64]
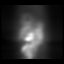
[frame 59/64]
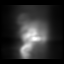

[Series 1: rest · 6.51mm/px · 6 of 64 frames shown (1 of 2)]
[frame 6/64]
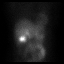
[frame 16/64]
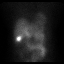
[frame 27/64]
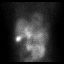
[frame 38/64]
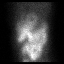
[frame 48/64]
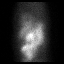
[frame 59/64]
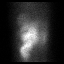

[Series 2: rest · 6.51mm/px · 6 of 64 frames shown (2 of 2)]
[frame 6/64]
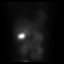
[frame 16/64]
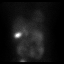
[frame 27/64]
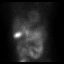
[frame 38/64]
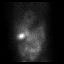
[frame 48/64]
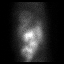
[frame 59/64]
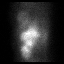

[Series 2: wbr_r-proj_st rest · 6.51mm/px · 6 of 64 frames shown (2 of 2)]
[frame 6/64]
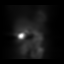
[frame 16/64]
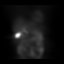
[frame 27/64]
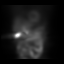
[frame 38/64]
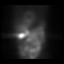
[frame 48/64]
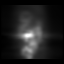
[frame 59/64]
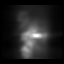

[Series 3: stress · 6.51mm/px · 6 of 64 frames shown (1 of 2)]
[frame 6/64]
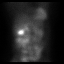
[frame 16/64]
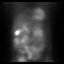
[frame 27/64]
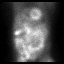
[frame 38/64]
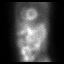
[frame 48/64]
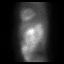
[frame 59/64]
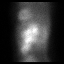

[Series 3: wbr_s-proj_st stress · 6.51mm/px · 6 of 512 frames shown (1 of 2)]
[frame 43/512]
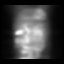
[frame 128/512]
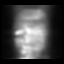
[frame 214/512]
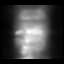
[frame 299/512]
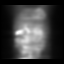
[frame 384/512]
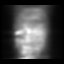
[frame 470/512]
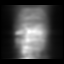

[Series 3: wbr_s-proj_st stress · 6.51mm/px · 6 of 64 frames shown (2 of 2)]
[frame 6/64]
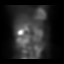
[frame 16/64]
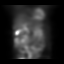
[frame 27/64]
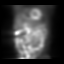
[frame 38/64]
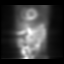
[frame 48/64]
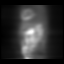
[frame 59/64]
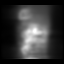

[Series 3: stress · 6.51mm/px · 6 of 512 frames shown (2 of 2)]
[frame 43/512  full-range]
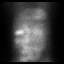
[frame 128/512  full-range]
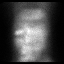
[frame 214/512  full-range]
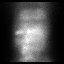
[frame 299/512  full-range]
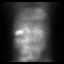
[frame 384/512  full-range]
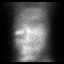
[frame 470/512  full-range]
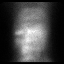

[48 of 48 positions shown; findings below may reference images not displayed]

Canned report from images found in remote index.

Refer to host system for actual result text.

## 2017-01-10 ENCOUNTER — Other Ambulatory Visit: Payer: Self-pay | Admitting: General Surgery

## 2017-01-21 ENCOUNTER — Encounter (HOSPITAL_BASED_OUTPATIENT_CLINIC_OR_DEPARTMENT_OTHER): Payer: Self-pay | Admitting: *Deleted

## 2017-01-24 ENCOUNTER — Encounter (HOSPITAL_BASED_OUTPATIENT_CLINIC_OR_DEPARTMENT_OTHER)
Admission: RE | Admit: 2017-01-24 | Discharge: 2017-01-24 | Disposition: A | Discharge status: Home / Self Care | Payer: BC Managed Care – PPO | Source: Ambulatory Visit | Attending: General Surgery | Admitting: General Surgery

## 2017-01-24 DIAGNOSIS — R9431 Abnormal electrocardiogram [ECG] [EKG]: Secondary | ICD-10-CM | POA: Diagnosis not present

## 2017-01-24 DIAGNOSIS — R001 Bradycardia, unspecified: Secondary | ICD-10-CM | POA: Diagnosis not present

## 2017-01-24 DIAGNOSIS — Z01812 Encounter for preprocedural laboratory examination: Secondary | ICD-10-CM | POA: Diagnosis not present

## 2017-01-24 DIAGNOSIS — Z01818 Encounter for other preprocedural examination: Secondary | ICD-10-CM | POA: Insufficient documentation

## 2017-01-24 LAB — CBC WITH DIFFERENTIAL/PLATELET
BASOS ABS: 0.1 10*3/uL (ref 0.0–0.1)
BASOS PCT: 2 %
EOS ABS: 0.3 10*3/uL (ref 0.0–0.7)
Eosinophils Relative: 6 %
HCT: 37.6 % (ref 36.0–46.0)
HEMOGLOBIN: 12.5 g/dL (ref 12.0–15.0)
Lymphocytes Relative: 42 %
Lymphs Abs: 2.3 10*3/uL (ref 0.7–4.0)
MCH: 30.4 pg (ref 26.0–34.0)
MCHC: 33.2 g/dL (ref 30.0–36.0)
MCV: 91.5 fL (ref 78.0–100.0)
MONOS PCT: 10 %
Monocytes Absolute: 0.6 10*3/uL (ref 0.1–1.0)
NEUTROS PCT: 40 %
Neutro Abs: 2.2 10*3/uL (ref 1.7–7.7)
Platelets: 323 10*3/uL (ref 150–400)
RBC: 4.11 MIL/uL (ref 3.87–5.11)
RDW: 13.1 % (ref 11.5–15.5)
WBC: 5.5 10*3/uL (ref 4.0–10.5)

## 2017-01-24 LAB — COMPREHENSIVE METABOLIC PANEL
ALBUMIN: 4.4 g/dL (ref 3.5–5.0)
ALK PHOS: 66 U/L (ref 38–126)
ALT: 41 U/L (ref 14–54)
ANION GAP: 7 (ref 5–15)
AST: 34 U/L (ref 15–41)
BUN: 13 mg/dL (ref 6–20)
CALCIUM: 10 mg/dL (ref 8.9–10.3)
CO2: 27 mmol/L (ref 22–32)
Chloride: 102 mmol/L (ref 101–111)
Creatinine, Ser: 0.91 mg/dL (ref 0.44–1.00)
GFR calc Af Amer: >60 mL/min (ref >60)
GFR calc non Af Amer: >60 mL/min (ref >60)
GLUCOSE: 154 mg/dL — AB (ref 65–99)
POTASSIUM: 4.6 mmol/L (ref 3.5–5.1)
SODIUM: 136 mmol/L (ref 135–145)
Total Bilirubin: 0.8 mg/dL (ref 0.3–1.2)
Total Protein: 7.6 g/dL (ref 6.5–8.1)

## 2017-01-24 NOTE — Progress Notes (Signed)
Labs and EKG obtained, Ensure pre surgery drink given with instructions to complete by 0630, pt verbalized understanding.

## 2017-01-26 NOTE — H&P (Signed)
Kari Torres Location: [facility] Patient #: [id number] DOB: [birth year] Married / Language: English / Race: Black or African American Female       History of Present Illness      This is a 51 year old female returns because of bloody discharge from her left nipple     I saw her in July 2017 because of a mammographic abnormality but no drainage. Image guided biopsy showed complex sclerosing lesion. Family history of breast cancer in her mother at age 66 who presented with stage IV disease     After the lumpectomy was performed revealing fibrocystic change with adenosis but no atypia and she healed well but developed a thin bloody discharge from the left nipple. This resolved but has recurred in April of this year. She states she went to [facility] in April and had imaging studies and was told there was nothing abnormal she says the drainage is spontaneous. Dark, occasionally a little red color.    Comorbidities include hypertension, non-insulin-dependent diabetes. She takes Premarin. Hyperlipidemia      Family history reveals breast cancer in mother. Stage IV disease. No ovarian cancer. The prostate cancer      I told her that the risk was too high to do nothing and recommended that we excise the subareolar breast tissue through a circumareolar incision using lacrimal duct probes to guide the dissection. I told her most likely would take all of the subareolar tissue out which would flatten the nipple and leaving it completely numb. Risk of bleeding, infection, further surgery if this is cancer was also discussed. She is more than willing to have this done and is ready to go at this time.   Addendum Note Mammograms performed at [facility] imaging on Jul 23, 2016. Also had ultrasound. There was no sonographic evidence of malignancy. 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 One-year follow-up was recommended.   Allergies  Shellfish-derived Products   Allergies Reconciled   Medication History  Atenolol (100MG  Tablet, Oral) Active. Atorvastatin Calcium (20MG  Tablet, Oral) Active. DiltiaZEM HCl ER Coated Beads (240MG  Capsule ER 24HR, Oral) Active. HydroCHLOROthiazide (25MG  Tablet, Oral) Active. MetFORMIN HCl (500MG  Tablet, Oral) Active. Premarin (0.625MG /GM Cream, Vaginal) Active. Triamcinolone Acetonide (0.1% Ointment, External) Active. Medications Reconciled  Vitals  Weight: 167.13 lb Temp.: 89F  Pulse: 69 (Regular)  P.OX: 99% (Room air) BP: 122/76 (Sitting, Left Arm, Standard)    Physical Exam  General Mental Status-Alert. General Appearance-Not in acute distress. Build & Nutrition-Well nourished. Posture-Normal posture. Gait-Normal.  Head and Neck Head-normocephalic, atraumatic with no lesions or palpable masses. Trachea-midline. Thyroid Gland Characteristics - normal size and consistency and no palpable nodules.  Chest and Lung Exam Chest and lung exam reveals -on auscultation, normal breath sounds, no adventitious sounds and normal vocal resonance.  Breast Note: Right breast unremarkable. No mass. No nipple abnormality. No nipple discharge. No axillary adenopathy. Left breast reveals normal skin no mass no adenopathy. I can repeatedly express a dark rust red-colored discharge from a simple central nipple duct. No palpable mass. No sign of infection   Cardiovascular Cardiovascular examination reveals -normal heart sounds, regular rate and rhythm with no murmurs and femoral artery auscultation bilaterally reveals normal pulses, no bruits, no thrills.  Abdomen Inspection Inspection of the abdomen reveals - No Hernias. Palpation/Percussion Palpation and Percussion of the abdomen reveal - Soft, Non Tender, No Rigidity (guarding), No hepatosplenomegaly and No Palpable abdominal masses.  Neurologic Neurologic evaluation reveals -alert and oriented x 3 with  no impairment of  recent or remote memory, normal attention span and ability to concentrate, normal sensation and normal coordination.  Musculoskeletal Normal Exam - Bilateral-Upper Extremity Strength Normal and Lower Extremity Strength Normal.    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 Dalbert Batman, M.D., [facility] General and Minimally invasive Surgery Breast and Colorectal Surgery Office:   [phone] Pager:   [phone]

## 2017-01-29 ENCOUNTER — Ambulatory Visit (HOSPITAL_BASED_OUTPATIENT_CLINIC_OR_DEPARTMENT_OTHER): Payer: BC Managed Care – PPO | Admitting: Anesthesiology

## 2017-01-29 ENCOUNTER — Encounter (HOSPITAL_BASED_OUTPATIENT_CLINIC_OR_DEPARTMENT_OTHER): Payer: Self-pay | Admitting: *Deleted

## 2017-01-29 ENCOUNTER — Ambulatory Visit (HOSPITAL_BASED_OUTPATIENT_CLINIC_OR_DEPARTMENT_OTHER)
Admission: RE | Admit: 2017-01-29 | Discharge: 2017-01-29 | Disposition: A | Discharge status: Home / Self Care | Payer: BC Managed Care – PPO | Source: Ambulatory Visit | Attending: General Surgery | Admitting: General Surgery

## 2017-01-29 ENCOUNTER — Other Ambulatory Visit: Payer: Self-pay

## 2017-01-29 ENCOUNTER — Encounter (HOSPITAL_BASED_OUTPATIENT_CLINIC_OR_DEPARTMENT_OTHER)
Admission: RE | Disposition: A | Discharge status: Home / Self Care | Payer: Self-pay | Source: Ambulatory Visit | Attending: General Surgery

## 2017-01-29 DIAGNOSIS — Z803 Family history of malignant neoplasm of breast: Secondary | ICD-10-CM | POA: Insufficient documentation

## 2017-01-29 DIAGNOSIS — I10 Essential (primary) hypertension: Secondary | ICD-10-CM | POA: Diagnosis not present

## 2017-01-29 DIAGNOSIS — E119 Type 2 diabetes mellitus without complications: Secondary | ICD-10-CM | POA: Insufficient documentation

## 2017-01-29 DIAGNOSIS — R0789 Other chest pain: Secondary | ICD-10-CM | POA: Diagnosis not present

## 2017-01-29 DIAGNOSIS — D242 Benign neoplasm of left breast: Secondary | ICD-10-CM | POA: Diagnosis not present

## 2017-01-29 DIAGNOSIS — Z7984 Long term (current) use of oral hypoglycemic drugs: Secondary | ICD-10-CM | POA: Insufficient documentation

## 2017-01-29 DIAGNOSIS — E785 Hyperlipidemia, unspecified: Secondary | ICD-10-CM | POA: Diagnosis not present

## 2017-01-29 DIAGNOSIS — N6452 Nipple discharge: Secondary | ICD-10-CM | POA: Diagnosis present

## 2017-01-29 DIAGNOSIS — Z79899 Other long term (current) drug therapy: Secondary | ICD-10-CM | POA: Insufficient documentation

## 2017-01-29 HISTORY — DX: Nipple discharge: N64.52

## 2017-01-29 HISTORY — PX: BREAST LUMPECTOMY: SHX2

## 2017-01-29 LAB — GLUCOSE, CAPILLARY
GLUCOSE-CAPILLARY: 126 mg/dL — AB (ref 65–99)
Glucose-Capillary: 146 mg/dL — ABNORMAL HIGH (ref 65–99)

## 2017-01-29 SURGERY — BREAST LUMPECTOMY
Anesthesia: General | Site: Breast | Laterality: Left

## 2017-01-29 MED ORDER — PROMETHAZINE HCL 25 MG/ML IJ SOLN: 6.2500 mg | INTRAMUSCULAR | Status: DC | PRN

## 2017-01-29 MED ORDER — OXYCODONE HCL 5 MG/5ML PO SOLN: 5.0000 mg | Freq: Once | ORAL | Status: DC | PRN

## 2017-01-29 MED ORDER — EPHEDRINE SULFATE 50 MG/ML IJ SOLN
INTRAMUSCULAR | Status: DC | PRN
  Administered 2017-01-29 (×2): 10 mg via INTRAVENOUS

## 2017-01-29 MED ORDER — GABAPENTIN 300 MG PO CAPS
ORAL_CAPSULE | ORAL | Status: AC
  Filled 2017-01-29: qty 1

## 2017-01-29 MED ORDER — MIDAZOLAM HCL 2 MG/2ML IJ SOLN
INTRAMUSCULAR | Status: AC
  Filled 2017-01-29: qty 2

## 2017-01-29 MED ORDER — LIDOCAINE HCL (PF) 1 % IJ SOLN
INTRAMUSCULAR | Status: AC
  Filled 2017-01-29: qty 30

## 2017-01-29 MED ORDER — MIDAZOLAM HCL 2 MG/2ML IJ SOLN
1.0000 mg | INTRAMUSCULAR | Status: DC | PRN
  Administered 2017-01-29: 2 mg via INTRAVENOUS

## 2017-01-29 MED ORDER — CHLORHEXIDINE GLUCONATE CLOTH 2 % EX PADS: 6.0000 | MEDICATED_PAD | Freq: Once | CUTANEOUS | Status: DC

## 2017-01-29 MED ORDER — FENTANYL CITRATE (PF) 100 MCG/2ML IJ SOLN
50.0000 ug | INTRAMUSCULAR | Status: DC | PRN
  Administered 2017-01-29: 100 ug via INTRAVENOUS

## 2017-01-29 MED ORDER — GABAPENTIN 300 MG PO CAPS
300.0000 mg | ORAL_CAPSULE | ORAL | Status: AC
  Administered 2017-01-29: 300 mg via ORAL

## 2017-01-29 MED ORDER — ACETAMINOPHEN 500 MG PO TABS
1000.0000 mg | ORAL_TABLET | ORAL | Status: AC
  Administered 2017-01-29: 1000 mg via ORAL

## 2017-01-29 MED ORDER — HYDROCODONE-ACETAMINOPHEN 5-325 MG PO TABS: 1.0000 | ORAL_TABLET | Freq: Four times a day (QID) | ORAL | 0 refills | Status: DC | PRN

## 2017-01-29 MED ORDER — BUPIVACAINE-EPINEPHRINE 0.5% -1:200000 IJ SOLN
INTRAMUSCULAR | Status: DC | PRN
  Administered 2017-01-29: 10 mL

## 2017-01-29 MED ORDER — CELECOXIB 200 MG PO CAPS
ORAL_CAPSULE | ORAL | Status: AC
  Filled 2017-01-29: qty 1

## 2017-01-29 MED ORDER — CEFAZOLIN SODIUM-DEXTROSE 2-4 GM/100ML-% IV SOLN
2.0000 g | INTRAVENOUS | Status: AC
  Administered 2017-01-29: 2 g via INTRAVENOUS

## 2017-01-29 MED ORDER — ONDANSETRON HCL 4 MG/2ML IJ SOLN
INTRAMUSCULAR | Status: AC
  Filled 2017-01-29: qty 2

## 2017-01-29 MED ORDER — FENTANYL CITRATE (PF) 100 MCG/2ML IJ SOLN
INTRAMUSCULAR | Status: AC
  Filled 2017-01-29: qty 2

## 2017-01-29 MED ORDER — LACTATED RINGERS IV SOLN
INTRAVENOUS | Status: DC
  Administered 2017-01-29 (×2): via INTRAVENOUS

## 2017-01-29 MED ORDER — FENTANYL CITRATE (PF) 100 MCG/2ML IJ SOLN: 25.0000 ug | INTRAMUSCULAR | Status: DC | PRN

## 2017-01-29 MED ORDER — ONDANSETRON HCL 4 MG/2ML IJ SOLN
INTRAMUSCULAR | Status: DC | PRN
  Administered 2017-01-29: 4 mg via INTRAVENOUS

## 2017-01-29 MED ORDER — BUPIVACAINE-EPINEPHRINE (PF) 0.5% -1:200000 IJ SOLN
INTRAMUSCULAR | Status: AC
  Filled 2017-01-29: qty 30

## 2017-01-29 MED ORDER — LIDOCAINE 2% (20 MG/ML) 5 ML SYRINGE
INTRAMUSCULAR | Status: DC | PRN
  Administered 2017-01-29: 80 mg via INTRAVENOUS

## 2017-01-29 MED ORDER — ACETAMINOPHEN 500 MG PO TABS
ORAL_TABLET | ORAL | Status: AC
Start: 2017-01-29 — End: 2017-01-29
  Filled 2017-01-29: qty 2

## 2017-01-29 MED ORDER — MEPERIDINE HCL 25 MG/ML IJ SOLN: 6.2500 mg | INTRAMUSCULAR | Status: DC | PRN

## 2017-01-29 MED ORDER — LACTATED RINGERS IV SOLN: 500.0000 mL | INTRAVENOUS | Status: DC

## 2017-01-29 MED ORDER — SCOPOLAMINE 1 MG/3DAYS TD PT72: 1.0000 | MEDICATED_PATCH | Freq: Once | TRANSDERMAL | Status: DC | PRN

## 2017-01-29 MED ORDER — PROPOFOL 500 MG/50ML IV EMUL
INTRAVENOUS | Status: AC
  Filled 2017-01-29: qty 50

## 2017-01-29 MED ORDER — CELECOXIB 200 MG PO CAPS
200.0000 mg | ORAL_CAPSULE | ORAL | Status: AC
  Administered 2017-01-29: 200 mg via ORAL

## 2017-01-29 MED ORDER — DEXAMETHASONE SODIUM PHOSPHATE 4 MG/ML IJ SOLN
INTRAMUSCULAR | Status: DC | PRN
  Administered 2017-01-29: 10 mg via INTRAVENOUS

## 2017-01-29 MED ORDER — SODIUM BICARBONATE 4 % IV SOLN
INTRAVENOUS | Status: AC
  Filled 2017-01-29: qty 5

## 2017-01-29 MED ORDER — EPHEDRINE 5 MG/ML INJ
INTRAVENOUS | Status: AC
  Filled 2017-01-29: qty 10

## 2017-01-29 MED ORDER — DEXAMETHASONE SODIUM PHOSPHATE 10 MG/ML IJ SOLN
INTRAMUSCULAR | Status: AC
  Filled 2017-01-29: qty 1

## 2017-01-29 MED ORDER — PROPOFOL 10 MG/ML IV BOLUS
INTRAVENOUS | Status: DC | PRN
Start: 2017-01-29 — End: 2017-01-29
  Administered 2017-01-29: 200 mg via INTRAVENOUS

## 2017-01-29 MED ORDER — LIDOCAINE 2% (20 MG/ML) 5 ML SYRINGE
INTRAMUSCULAR | Status: AC
  Filled 2017-01-29: qty 5

## 2017-01-29 MED ORDER — OXYCODONE HCL 5 MG PO TABS: 5.0000 mg | ORAL_TABLET | Freq: Once | ORAL | Status: DC | PRN

## 2017-01-29 SURGICAL SUPPLY — 66 items
APPLIER CLIP 9.375 MED OPEN (MISCELLANEOUS)
BENZOIN TINCTURE PRP APPL 2/3 (GAUZE/BANDAGES/DRESSINGS) IMPLANT
BINDER BREAST LRG (GAUZE/BANDAGES/DRESSINGS) IMPLANT
BINDER BREAST MEDIUM (GAUZE/BANDAGES/DRESSINGS) IMPLANT
BINDER BREAST XLRG (GAUZE/BANDAGES/DRESSINGS) ×3 IMPLANT
BINDER BREAST XXLRG (GAUZE/BANDAGES/DRESSINGS) IMPLANT
BLADE HEX COATED 2.75 (ELECTRODE) ×3 IMPLANT
BLADE SURG 15 STRL LF DISP TIS (BLADE) ×1 IMPLANT
BLADE SURG 15 STRL SS (BLADE) ×2
CANISTER SUCT 1200ML W/VALVE (MISCELLANEOUS) IMPLANT
CHLORAPREP W/TINT 26ML (MISCELLANEOUS) ×3 IMPLANT
CLIP APPLIE 9.375 MED OPEN (MISCELLANEOUS) IMPLANT
CLOSURE WOUND 1/2 X4 (GAUZE/BANDAGES/DRESSINGS)
COVER BACK TABLE 60X90IN (DRAPES) ×3 IMPLANT
COVER MAYO STAND STRL (DRAPES) ×3 IMPLANT
DECANTER SPIKE VIAL GLASS SM (MISCELLANEOUS) IMPLANT
DERMABOND ADVANCED (GAUZE/BANDAGES/DRESSINGS)
DERMABOND ADVANCED .7 DNX12 (GAUZE/BANDAGES/DRESSINGS) IMPLANT
DEVICE DUBIN W/COMP PLATE 8390 (MISCELLANEOUS) IMPLANT
DRAPE LAPAROSCOPIC ABDOMINAL (DRAPES) IMPLANT
DRAPE LAPAROTOMY 100X72 PEDS (DRAPES) ×3 IMPLANT
DRAPE LAPAROTOMY TRNSV 102X78 (DRAPE) IMPLANT
DRAPE UTILITY XL STRL (DRAPES) ×3 IMPLANT
DRSG PAD ABDOMINAL 8X10 ST (GAUZE/BANDAGES/DRESSINGS) ×3 IMPLANT
ELECT REM PT RETURN 9FT ADLT (ELECTROSURGICAL) ×3
ELECTRODE REM PT RTRN 9FT ADLT (ELECTROSURGICAL) ×1 IMPLANT
GAUZE SPONGE 4X4 12PLY STRL LF (GAUZE/BANDAGES/DRESSINGS) IMPLANT
GAUZE SPONGE 4X4 16PLY XRAY LF (GAUZE/BANDAGES/DRESSINGS) IMPLANT
GLOVE BIO SURGEON STRL SZ 6.5 (GLOVE) ×2 IMPLANT
GLOVE BIO SURGEONS STRL SZ 6.5 (GLOVE) ×1
GLOVE BIOGEL PI IND STRL 8 (GLOVE) ×1 IMPLANT
GLOVE BIOGEL PI INDICATOR 8 (GLOVE) ×2
GLOVE EUDERMIC 7 POWDERFREE (GLOVE) ×3 IMPLANT
GOWN STRL REUS W/ TWL LRG LVL3 (GOWN DISPOSABLE) ×1 IMPLANT
GOWN STRL REUS W/ TWL XL LVL3 (GOWN DISPOSABLE) ×1 IMPLANT
GOWN STRL REUS W/TWL LRG LVL3 (GOWN DISPOSABLE) ×2
GOWN STRL REUS W/TWL XL LVL3 (GOWN DISPOSABLE) ×2
ILLUMINATOR WAVEGUIDE N/F (MISCELLANEOUS) IMPLANT
KIT MARKER MARGIN INK (KITS) IMPLANT
LIGHT WAVEGUIDE WIDE FLAT (MISCELLANEOUS) IMPLANT
NEEDLE HYPO 22GX1.5 SAFETY (NEEDLE) IMPLANT
NEEDLE HYPO 25X1 1.5 SAFETY (NEEDLE) ×3 IMPLANT
NS IRRIG 1000ML POUR BTL (IV SOLUTION) ×3 IMPLANT
PACK BASIN DAY SURGERY FS (CUSTOM PROCEDURE TRAY) ×3 IMPLANT
PENCIL BUTTON HOLSTER BLD 10FT (ELECTRODE) ×3 IMPLANT
SLEEVE SCD COMPRESS KNEE MED (MISCELLANEOUS) ×3 IMPLANT
SPONGE LAP 4X18 X RAY DECT (DISPOSABLE) ×3 IMPLANT
STAPLER VISISTAT 35W (STAPLE) IMPLANT
STRIP CLOSURE SKIN 1/2X4 (GAUZE/BANDAGES/DRESSINGS) IMPLANT
SUT ETHILON 4 0 PS 2 18 (SUTURE) IMPLANT
SUT MNCRL AB 4-0 PS2 18 (SUTURE) IMPLANT
SUT SILK 2 0 SH (SUTURE) ×3 IMPLANT
SUT VIC AB 2-0 SH 27 (SUTURE)
SUT VIC AB 2-0 SH 27XBRD (SUTURE) IMPLANT
SUT VIC AB 3-0 FS2 27 (SUTURE) IMPLANT
SUT VIC AB 4-0 P-3 18XBRD (SUTURE) IMPLANT
SUT VIC AB 4-0 P3 18 (SUTURE)
SUT VICRYL 3-0 CR8 SH (SUTURE) ×3 IMPLANT
SUT VICRYL 4-0 PS2 18IN ABS (SUTURE) IMPLANT
SYR 10ML LL (SYRINGE) ×3 IMPLANT
SYR BULB 3OZ (MISCELLANEOUS) IMPLANT
TAPE HYPAFIX 4 X10 (GAUZE/BANDAGES/DRESSINGS) IMPLANT
TOWEL OR NON WOVEN STRL DISP B (DISPOSABLE) ×3 IMPLANT
TUBE CONNECTING 20'X1/4 (TUBING) ×1
TUBE CONNECTING 20X1/4 (TUBING) ×2 IMPLANT
YANKAUER SUCT BULB TIP NO VENT (SUCTIONS) ×3 IMPLANT

## 2017-01-29 NOTE — Discharge Instructions (Signed)
Most likely we will be able to finalize the pathology report by midday Friday. If Dr. Darrel Hoover office doesn't call you by early afternoon Friday, call the office and ask to speak to Dr. Darrel Hoover nurse.  She will get the report for you.      [place] Office Phone Number [phone]   Post Anesthesia Home Care Instructions  Activity: Get plenty of rest for the remainder of the day. A responsible individual must stay with you for 24 hours following the procedure.  For the next 24 hours, DO NOT: -Drive a car -Paediatric nurse -Drink alcoholic beverages -Take any medication unless instructed by your physician -Make any legal decisions or sign important papers.  Meals: Start with liquid foods such as gelatin or soup. Progress to regular foods as tolerated. Avoid greasy, spicy, heavy foods. If nausea and/or vomiting occur, drink only clear liquids until the nausea and/or vomiting subsides. Call your physician if vomiting continues.  Special Instructions/Symptoms: Your throat may feel dry or sore from the anesthesia or the breathing tube placed in your throat during surgery. If this causes discomfort, gargle with warm salt water. The discomfort should disappear within 24 hours.  If you had a scopolamine patch placed behind your ear for the management of post- operative nausea and/or vomiting:  1. The medication in the patch is effective for 72 hours, after which it should be removed.  Wrap patch in a tissue and discard in the trash. Wash hands thoroughly with soap and water. 2. You may remove the patch earlier than 72 hours if you experience unpleasant side effects which may include dry mouth, dizziness or visual disturbances. 3. Avoid touching the patch. Wash your hands with soap and water after contact with the patch.     BREAST BIOPSY/ PARTIAL MASTECTOMY: POST OP INSTRUCTIONS  Always review your discharge instruction sheet given to you by the facility where your  surgery was performed.  IF YOU HAVE DISABILITY OR FAMILY LEAVE FORMS, YOU MUST BRING THEM TO THE OFFICE FOR PROCESSING.  DO NOT GIVE THEM TO YOUR DOCTOR.  1. A prescription for pain medication may be given to you upon discharge.  Take your pain medication as prescribed, if needed.  If narcotic pain medicine is not needed, then you may take acetaminophen (Tylenol) or ibuprofen (Advil) as needed. 2. Take your usually prescribed medications unless otherwise directed 3. If you need a refill on your pain medication, please contact your pharmacy.  They will contact our office to request authorization.  Prescriptions will not be filled after 5pm or on week-ends. 4. You should eat very light the first 24 hours after surgery, such as soup, crackers, pudding, etc.  Resume your normal diet the day after surgery. 5. Most patients will experience some swelling and bruising in the breast.  Ice packs and a good support bra will help.  Swelling and bruising can take several days to resolve.  6. It is common to experience some constipation if taking pain medication after surgery.  Increasing fluid intake and taking a stool softener will usually help or prevent this problem from occurring.  A mild laxative (Milk of Magnesia or Miralax) should be taken according to package directions if there are no bowel movements after 48 hours. 7. Unless discharge instructions indicate otherwise, you may remove your bandages 24-48 hours after surgery, and you may shower at that time.  You may have steri-strips (small skin tapes) in place directly over the incision.  These strips should be left  on the skin for 7-10 days.  If your surgeon used skin glue on the incision, you may shower in 24 hours.  The glue will flake off over the next 2-3 weeks.  Any sutures or staples will be removed at the office during your follow-up visit. 8. ACTIVITIES:  You may resume regular daily activities (gradually increasing) beginning the next day.  Wearing a  good support bra or sports bra minimizes pain and swelling.  You may have sexual intercourse when it is comfortable. a. You may drive when you no longer are taking prescription pain medication, you can comfortably wear a seatbelt, and you can safely maneuver your car and apply brakes. b. RETURN TO WORK:  ______________________________________________________________________________________ 9. You should see your doctor in the office for a follow-up appointment approximately two weeks after your surgery.  Your doctors nurse will typically make your follow-up appointment when she calls you with your pathology report.  Expect your pathology report 2-3 business days after your surgery.  You may call to check if you do not hear from [place] after three days. 10. OTHER INSTRUCTIONS: _______________________________________________________________________________________________ _____________________________________________________________________________________________________________________________________ _____________________________________________________________________________________________________________________________________ _____________________________________________________________________________________________________________________________________  WHEN TO CALL YOUR DOCTOR: 1. Fever over 101.0 2. Nausea and/or vomiting. 3. Extreme swelling or bruising. 4. Continued bleeding from incision. 5. Increased pain, redness, or drainage from the incision.  The clinic staff is available to answer your questions during regular business hours.  Please dont hesitate to call and ask to speak to one of the nurses for clinical concerns.  If you have a medical emergency, go to the nearest emergency room or call 911.  A surgeon from [facility] is always on call at the hospital.  For further questions, please visit [email or web address]

## 2017-01-29 NOTE — Anesthesia Preprocedure Evaluation (Signed)
Anesthesia Evaluation  Patient identified by MRN, date of birth, ID band Patient awake    Reviewed: Allergy & Precautions, NPO status , Patient's Chart, lab work & pertinent test results, reviewed documented beta blocker date and time   History of Anesthesia Complications Negative for: history of anesthetic complications  Airway Mallampati: I  TM Distance: >3 FB Neck ROM: Full    Dental  (+) Dental Advisory Given   Pulmonary neg pulmonary ROS,    breath sounds clear to auscultation       Cardiovascular hypertension, Pt. on medications and Pt. on home beta blockers (-) angina Rhythm:Regular Rate:Normal  5/17 Stress: EF 57%, no ischemia   Neuro/Psych negative neurological ROS  negative psych ROS   GI/Hepatic negative GI ROS, Neg liver ROS,   Endo/Other  diabetes, Type 2, Oral Hypoglycemic Agents  Renal/GU negative Renal ROS     Musculoskeletal   Abdominal   Peds  Hematology   Anesthesia Other Findings   Reproductive/Obstetrics                             Anesthesia Physical  Anesthesia Plan  ASA: III  Anesthesia Plan: General   Post-op Pain Management:    Induction: Intravenous  PONV Risk Score and Plan: 3 and Ondansetron, Midazolam and Treatment may vary due to age or medical condition  Airway Management Planned: LMA  Additional Equipment:   Intra-op Plan:   Post-operative Plan:   Informed Consent: I have reviewed the patients History and Physical, chart, labs and discussed the procedure including the risks, benefits and alternatives for the proposed anesthesia with the patient or authorized representative who has indicated his/her understanding and acceptance.   Dental advisory given  Plan Discussed with: CRNA and Surgeon  Anesthesia Plan Comments: (Plan routine monitors, GA- LMA OK)        Anesthesia Quick Evaluation

## 2017-01-29 NOTE — Anesthesia Postprocedure Evaluation (Signed)
Anesthesia Post Note  Patient: Kari Torres  Procedure(s) Performed: LEFT BREAST LUMPECTOMY SUBAREOLAR DISSECTION ERAS PATHWAY (Left Breast)     Patient location during evaluation: PACU Anesthesia Type: General Level of consciousness: awake and alert Pain management: pain level controlled Vital Signs Assessment: post-procedure vital signs reviewed and stable Respiratory status: spontaneous breathing, nonlabored ventilation and respiratory function stable Cardiovascular status: blood pressure returned to baseline and stable Postop Assessment: no apparent nausea or vomiting Anesthetic complications: no    Last Vitals:  Vitals:   01/29/17 1105 01/29/17 1115  BP:  121/69  Pulse:    Resp:  17  Temp:    SpO2: 100% 100%    Last Pain:  Vitals:   01/29/17 1115  TempSrc:   PainSc: 2                  Lynda Rainwater

## 2017-01-29 NOTE — Anesthesia Procedure Notes (Signed)
Procedure Name: LMA Insertion Date/Time: 01/29/2017 10:10 AM Performed by: Lyndee Leo, CRNA Pre-anesthesia Checklist: Patient identified, Emergency Drugs available, Suction available and Patient being monitored Patient Re-evaluated:Patient Re-evaluated prior to induction Oxygen Delivery Method: Circle system utilized Preoxygenation: Pre-oxygenation with 100% oxygen Induction Type: IV induction Ventilation: Mask ventilation without difficulty LMA: LMA inserted LMA Size: 4.0 Number of attempts: 1 Airway Equipment and Method: Bite block Placement Confirmation: positive ETCO2 Tube secured with: Tape Dental Injury: Teeth and Oropharynx as per pre-operative assessment

## 2017-01-29 NOTE — Interval H&P Note (Signed)
History and Physical Interval Note:  01/29/2017 9:20 AM  Kari Torres  has presented today for surgery, with the diagnosis of bloody left nipple discharge  The various methods of treatment have been discussed with the patient and family. After consideration of risks, benefits and other options for treatment, the patient has consented to  Procedure(s) with comments: LEFT BREAST LUMPECTOMY SUBAREOLAR DISSECTION ERAS PATHWAY (Left) - LMA as a surgical intervention .  The patient's history has been reviewed, patient examined, no change in status, stable for surgery.  I have reviewed the patient's chart and labs.  Questions were answered to the patient's satisfaction.     Adin Hector

## 2017-01-29 NOTE — Op Note (Signed)
Patient Name:           Kari Torres   Date of Surgery:        01/29/2017  Pre op Diagnosis:      Bloody discharge left nipple  Post op Diagnosis:    Same  Procedure:                 Left breast lumpectomy, subareolar duct excision  Surgeon:                     Edsel Petrin. Dalbert Batman, M.D., FACS  Assistant:                      Or staff  Operative Indications:   This is a 51 year old female returns because of bloody discharge from her left nipple     I saw her in July 2017 because of a mammographic abnormality but no drainage. Image guided biopsy showed complex sclerosing lesion.      After the lumpectomy was performed revealing fibrocystic change with adenosis but no atypia and she healed well but later  developed a thin bloody discharge from the left nipple. This resolved but has recurred in April of this year. Mammograms performed at [place] on Jul 23, 2016. Also had ultrasound. There was no sonographic evidence of malignancy. 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 One-year follow-up was recommended.  she says the drainage is spontaneous. Dark, occasionally a little red color.  I could reproduce this drainage in the office from a central lactiferous duct on the left nipple.      Family history reveals breast cancer in mother. Stage IV disease. No ovarian cancer.      I told her that the risk was too high to do nothing and recommended that we excise the subareolar breast tissue through a circumareolar incision using lacrimal duct probes to guide the dissection. I told her most likely would take all of the subareolar tissue out which would flatten the nipple and leaving it completely numb. Risk of bleeding, infection, further surgery if this is cancer was also discussed. She is more than willing to have this done and is ready to go at this time.  Operative Findings:       There was a somewhat dilated lactiferous duct centrally that was discharging the dark bloody  material.  I was able to mark this for the pathologist and inserted a lacrimal duct probe and follow this directly posteriorly.  There is no palpable abnormality.  Procedure in Detail:          Following the induction of general LMA anesthesia the patient's left breast was prepped and draped in a sterile fashion.  Surgical timeout was performed.  Intravenous antibiotics were given.  0.5% Marcaine with epinephrine was used as a local infiltration anesthetic.     I could reproduce the drainage but I could not intubate the nipple duct directly.  I made a laterally placed circumareolar incision, through the old scar.  We lifted up the nipple areolar complex and dissected under this until we encountered the duct which was dilated and discharging bloody material.  This was marked with Vicryl sutures which helped [place] lift it  up and inserted a lacrimal duct probe which when in about 2.5 cm.  I then used electrocautery and dissected widely around this area.  The specimen was removed and marked with silk sutures to orient the pathologist.  The  specimen was sent to pathology fresh.  Hemostasis was excellent and achieved with electrocautery.  The wound was irrigated with saline.  The breast tissues were closed in 2 separate layers with interrupted 3-0 Vicryl and the skin closed with a running subcuticular 4-0 Monocryl and Dermabond.  Clean bandages and a breast binder were placed.  The patient tolerated the procedure well was taken to PACU in stable condition.  EBL 10 mL.  Counts correct.  Complications none.     Edsel Petrin. Dalbert Batman, M.D., FACS General and Minimally Invasive Surgery Breast and Colorectal Surgery    Addendum: I logged onto the [facility]  website and reviewed her prescription medication history  01/29/2017 10:51 AM

## 2017-01-29 NOTE — Transfer of Care (Signed)
Immediate Anesthesia Transfer of Care Note  Patient: Kari Torres  Procedure(s) Performed: LEFT BREAST LUMPECTOMY SUBAREOLAR DISSECTION ERAS PATHWAY (Left Breast)  Patient Location: PACU  Anesthesia Type:General  Level of Consciousness: awake, sedated and patient cooperative  Airway & Oxygen Therapy: Patient Spontanous Breathing and Patient connected to face mask oxygen  Post-op Assessment: Report given to RN and Post -op Vital signs reviewed and stable  Post vital signs: Reviewed and stable  Last Vitals:  Vitals:   01/29/17 0915  BP: (!) 160/83  Pulse: (!) 58  Resp: 20  Temp: 36.7 C  SpO2: 100%    Last Pain:  Vitals:   01/29/17 0915  TempSrc: Oral         Complications: No apparent anesthesia complications

## 2017-01-30 ENCOUNTER — Encounter (HOSPITAL_BASED_OUTPATIENT_CLINIC_OR_DEPARTMENT_OTHER): Payer: Self-pay | Admitting: General Surgery

## 2021-07-12 DIAGNOSIS — I10 Essential (primary) hypertension: Secondary | ICD-10-CM | POA: Insufficient documentation

## 2021-07-12 DIAGNOSIS — E119 Type 2 diabetes mellitus without complications: Secondary | ICD-10-CM | POA: Insufficient documentation

## 2021-11-12 ENCOUNTER — Other Ambulatory Visit: Payer: Self-pay | Admitting: Radiology

## 2021-11-14 ENCOUNTER — Telehealth: Payer: Self-pay | Admitting: Hematology and Oncology

## 2021-11-14 NOTE — Telephone Encounter (Signed)
Spoke to patient to confirm morning clinic for 8/30, patient also updated her address with me

## 2021-11-14 NOTE — Telephone Encounter (Signed)
LVM for patient to return call in reference to upcoming morning clinic appointment for 8/30

## 2021-11-16 ENCOUNTER — Encounter: Payer: Self-pay | Admitting: Obstetrics

## 2021-11-19 ENCOUNTER — Encounter: Payer: Self-pay | Admitting: *Deleted

## 2021-11-19 DIAGNOSIS — C50412 Malignant neoplasm of upper-outer quadrant of left female breast: Secondary | ICD-10-CM | POA: Insufficient documentation

## 2021-11-20 ENCOUNTER — Other Ambulatory Visit: Payer: Self-pay | Admitting: General Surgery

## 2021-11-20 DIAGNOSIS — Z170 Estrogen receptor positive status [ER+]: Secondary | ICD-10-CM

## 2021-11-20 NOTE — Progress Notes (Signed)
Radiation Oncology         [phone] ________________________________  Name: Kari Torres        MRN: [id number]  Date of Service: 11/21/2021 DOB: [birth year]  LX:[id number], L.Marlou Sa, MD  Stark Klein, MD     REFERRING PHYSICIAN: Stark Klein, MD   DIAGNOSIS: The encounter diagnosis was Malignant neoplasm of upper-outer quadrant of left breast in female, estrogen receptor positive ([place]).   HISTORY OF PRESENT ILLNESS: Kari Torres is a 56 y.o. female seen in the multidisciplinary breast clinic for a new diagnosis of left breast cancer. 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 Dalbert Batman and pathology showed papillomatous changes without malignancy. She has been followed and recent mammogram showed a developing left breast asymmetry.  A left breast ultrasound showed a 1.5 cm irregular mass in the 2:00 position, and no abnormalities were seen in the left axilla. 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 She's seen today to discuss treatment recommendations.     PREVIOUS RADIATION THERAPY: No   PAST MEDICAL HISTORY:  Past Medical History:  Diagnosis Date   Allergic rhinitis due to pollen    Bloody discharge from left nipple 01/29/2017   Diabetes mellitus without complication ([place])    Hypercholesterolemia    Hypertension        PAST SURGICAL HISTORY: Past Surgical History:  Procedure Laterality Date   BREAST LUMPECTOMY Left 01/29/2017   Procedure: LEFT BREAST LUMPECTOMY SUBAREOLAR DISSECTION ERAS PATHWAY;  Surgeon: Fanny Skates, MD;  Location: [place];  Service: General;  Laterality: Left;  LMA   BREAST LUMPECTOMY WITH RADIOACTIVE SEED LOCALIZATION Left 09/15/2015   Procedure: LEFT BREAST LUMPECTOMY WITH RADIOACTIVE SEED LOCALIZATION;  Surgeon: Fanny Skates, MD;  Location:  [place];  Service: General;  Laterality: Left;   CESAREAN SECTION     WISDOM TOOTH EXTRACTION       FAMILY HISTORY:  Family History  Problem Relation Age of Onset   Hypertension Mother    Breast cancer Mother    Hypertension Father    Diabetes Father    CAD Father    Hypertension Maternal Grandmother    Diabetes Maternal Grandmother    Congestive Heart Failure Maternal Grandmother    Diabetes Paternal Grandmother    Diabetes Paternal Merchant navy officer    Healthy Sister    Healthy Brother    Healthy Brother    Healthy Brother    Healthy Brother      SOCIAL HISTORY:  reports that she has never smoked. She has never used smokeless tobacco. She reports current alcohol use. She reports that she does not use drugs. The patient is divorced and lives in [place]. 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 ***   ALLERGIES: Food   MEDICATIONS:  Current Outpatient Medications  Medication Sig Dispense Refill   aspirin 81 MG tablet Take 81 mg by mouth daily.     atenolol (TENORMIN) 100 MG tablet Take 100 mg by mouth daily.     atorvastatin (LIPITOR) 20 MG tablet Take 20 mg by mouth daily.     conjugated estrogens (PREMARIN) vaginal cream Place 1 Applicatorful vaginally once a week.     diltiazem (DILACOR XR) 240 MG 24 hr capsule Take 240 mg by mouth daily.  hydrochlorothiazide (HYDRODIURIL) 25 MG tablet Take 25 mg by mouth daily.     HYDROcodone-acetaminophen (NORCO) 5-325 MG tablet Take 1-2 tablets every 6 (six) hours as needed by mouth for moderate pain or severe pain. 20 tablet 0   levonorgestrel (MIRENA) 20 MCG/24HR IUD 1 each by Intrauterine route once.     loratadine (CLARITIN) 10 MG tablet Take 10 mg by mouth daily as needed for allergies.     LUTEIN PO Take by mouth.     metFORMIN (GLUCOPHAGE) 500 MG tablet Take 500 mg by mouth 2 (two) times daily with a meal.     Multiple Vitamins-Minerals (CENTRUM ADULTS PO) Take 1 tablet by mouth daily.     omega-3 acid ethyl  esters (LOVAZA) 1 g capsule Take by mouth 2 (two) times daily.     No current facility-administered medications for this visit.     REVIEW OF SYSTEMS: On review of systems, the patient reports that she is doing ***     PHYSICAL EXAM:  Wt Readings from Last 3 Encounters:  01/29/17 164 lb (74.4 kg)  09/15/15 166 lb (75.3 kg)  06/23/15 168 lb 12.8 oz (76.6 kg)   Temp Readings from Last 3 Encounters:  01/29/17 97.6 F (36.4 C)  09/15/15 97.6 F (36.4 C)   BP Readings from Last 3 Encounters:  01/29/17 121/69  09/15/15 114/73  06/23/15 (!) 126/100   Pulse Readings from Last 3 Encounters:  01/29/17 60  09/15/15 63  06/23/15 60    In general this is a well appearing *** female in no acute distress. She's alert and oriented x4 and appropriate throughout the examination. Cardiopulmonary assessment is negative for acute distress and she exhibits normal effort. Bilateral breast exam is deferred.    ECOG = ***  0 - Asymptomatic (Fully active, able to carry on all predisease activities without restriction)  1 - Symptomatic but completely ambulatory (Restricted in physically strenuous activity but ambulatory and able to carry out work of a light or sedentary nature. For example, light housework, office work)  2 - Symptomatic, <50% in bed during the day (Ambulatory and capable of all self care but unable to carry out any work activities. Up and about more than 50% of waking hours)  3 - Symptomatic, >50% in bed, but not bedbound (Capable of only limited self-care, confined to bed or chair 50% or more of waking hours)  4 - Bedbound (Completely disabled. Cannot carry on any self-care. Totally confined to bed or chair)  5 - Death   Eustace Pen MM, Creech RH, Tormey DC, et al. [phone]). "Toxicity and response criteria of the [facility]". [place] Oncol. 5 (6): 649-55    LABORATORY DATA:  Lab Results  Component Value Date   WBC 5.5 01/24/2017   HGB 12.5  01/24/2017   HCT 37.6 01/24/2017   MCV 91.5 01/24/2017   PLT 323 01/24/2017   Lab Results  Component Value Date   NA 136 01/24/2017   K 4.6 01/24/2017   CL 102 01/24/2017   CO2 27 01/24/2017   Lab Results  Component Value Date   ALT 41 01/24/2017   AST 34 01/24/2017   ALKPHOS 66 01/24/2017   BILITOT 0.8 01/24/2017      RADIOGRAPHY: No results found.     IMPRESSION/PLAN: 1. Stage IA, cT1cN0M0, grade 2, ER/PR positive invasive ductal carcinoma of the left breast. Dr. Lisbeth Renshaw discusses the pathology findings and reviews the nature of *** breast disease. The consensus from  the breast conference includes breast conservation with lumpectomy with sentinel node biopsy. Dr. Chryl Heck plans on Oncotype Dx score to determine a role for systemic therapy. Provided that chemotherapy is not indicated, the patient's course would then be followed by external radiotherapy to the breast  to reduce risks of local recurrence followed by antiestrogen therapy. We discussed the risks, benefits, short, and long term effects of radiotherapy, as well as the curative intent, and the patient is interested in proceeding. Dr. Lisbeth Renshaw discusses the delivery and logistics of radiotherapy and anticipates a course of 4 or up to 6 1/2 weeks of radiotherapy to the left breast with deep inspiration breath hold technique. We will see her back a few weeks after surgery to discuss the simulation process and anticipate we starting radiotherapy about 4-6 weeks after surgery.  2. Possible genetic predisposition to malignancy. The patient is a candidate for genetic testing given *** personal and family history. She will see genetics today in clinic.  In a visit lasting *** minutes, greater than 50% of the time was spent face to face reviewing her case, as well as in preparation of, discussing, and coordinating the patient's care.  The above documentation reflects my direct findings during this shared patient visit. Please see the separate  note by Dr. Lisbeth Renshaw on this date for the remainder of the patient's plan of care.    Carola Rhine, [facility]    **Disclaimer: This note was dictated with voice recognition software. Similar sounding words can inadvertently be transcribed and this note may contain transcription errors which may not have been corrected upon publication of note.**

## 2021-11-21 ENCOUNTER — Ambulatory Visit
Admission: RE | Admit: 2021-11-21 | Discharge: 2021-11-21 | Disposition: A | Discharge status: Home / Self Care | Payer: Managed Care, Other (non HMO) | Source: Ambulatory Visit | Attending: Radiation Oncology | Admitting: Radiation Oncology

## 2021-11-21 ENCOUNTER — Other Ambulatory Visit: Payer: Self-pay

## 2021-11-21 ENCOUNTER — Inpatient Hospital Stay: Payer: Managed Care, Other (non HMO)

## 2021-11-21 ENCOUNTER — Encounter: Payer: Self-pay | Admitting: Hematology and Oncology

## 2021-11-21 ENCOUNTER — Inpatient Hospital Stay: Payer: Managed Care, Other (non HMO) | Admitting: Genetic Counselor

## 2021-11-21 ENCOUNTER — Encounter: Payer: Self-pay | Admitting: Physical Therapy

## 2021-11-21 ENCOUNTER — Encounter: Payer: Self-pay | Admitting: Genetic Counselor

## 2021-11-21 ENCOUNTER — Encounter: Payer: Self-pay | Admitting: *Deleted

## 2021-11-21 ENCOUNTER — Inpatient Hospital Stay: Payer: Managed Care, Other (non HMO) | Admitting: Licensed Clinical Social Worker

## 2021-11-21 ENCOUNTER — Ambulatory Visit: Payer: Managed Care, Other (non HMO) | Attending: General Surgery | Admitting: Physical Therapy

## 2021-11-21 ENCOUNTER — Inpatient Hospital Stay: Payer: Managed Care, Other (non HMO) | Attending: Hematology and Oncology | Admitting: Hematology and Oncology

## 2021-11-21 DIAGNOSIS — Z170 Estrogen receptor positive status [ER+]: Secondary | ICD-10-CM

## 2021-11-21 DIAGNOSIS — R293 Abnormal posture: Secondary | ICD-10-CM | POA: Insufficient documentation

## 2021-11-21 DIAGNOSIS — I10 Essential (primary) hypertension: Secondary | ICD-10-CM | POA: Insufficient documentation

## 2021-11-21 DIAGNOSIS — C50412 Malignant neoplasm of upper-outer quadrant of left female breast: Secondary | ICD-10-CM

## 2021-11-21 DIAGNOSIS — Z803 Family history of malignant neoplasm of breast: Secondary | ICD-10-CM | POA: Insufficient documentation

## 2021-11-21 DIAGNOSIS — Z833 Family history of diabetes mellitus: Secondary | ICD-10-CM | POA: Diagnosis not present

## 2021-11-21 DIAGNOSIS — E785 Hyperlipidemia, unspecified: Secondary | ICD-10-CM | POA: Insufficient documentation

## 2021-11-21 DIAGNOSIS — Z8052 Family history of malignant neoplasm of bladder: Secondary | ICD-10-CM

## 2021-11-21 DIAGNOSIS — Z808 Family history of malignant neoplasm of other organs or systems: Secondary | ICD-10-CM | POA: Diagnosis not present

## 2021-11-21 DIAGNOSIS — N6489 Other specified disorders of breast: Secondary | ICD-10-CM | POA: Insufficient documentation

## 2021-11-21 DIAGNOSIS — E119 Type 2 diabetes mellitus without complications: Secondary | ICD-10-CM | POA: Insufficient documentation

## 2021-11-21 DIAGNOSIS — Z79899 Other long term (current) drug therapy: Secondary | ICD-10-CM | POA: Insufficient documentation

## 2021-11-21 DIAGNOSIS — Z8249 Family history of ischemic heart disease and other diseases of the circulatory system: Secondary | ICD-10-CM | POA: Diagnosis not present

## 2021-11-21 HISTORY — DX: Family history of malignant neoplasm of breast: Z80.3

## 2021-11-21 LAB — CMP (CANCER CENTER ONLY)
ALT: 17 U/L (ref 0–44)
AST: 20 U/L (ref 15–41)
Albumin: 5.2 g/dL — ABNORMAL HIGH (ref 3.5–5.0)
Alkaline Phosphatase: 58 U/L (ref 38–126)
Anion gap: 8 (ref 5–15)
BUN: 18 mg/dL (ref 6–20)
CO2: 31 mmol/L (ref 22–32)
Calcium: 10.8 mg/dL — ABNORMAL HIGH (ref 8.9–10.3)
Chloride: 97 mmol/L — ABNORMAL LOW (ref 98–111)
Creatinine: 0.85 mg/dL (ref 0.44–1.00)
GFR, Estimated: >60 mL/min (ref >60)
Glucose, Bld: 203 mg/dL — ABNORMAL HIGH (ref 70–99)
Potassium: 3.8 mmol/L (ref 3.5–5.1)
Sodium: 136 mmol/L (ref 135–145)
Total Bilirubin: 0.8 mg/dL (ref 0.3–1.2)
Total Protein: 8.5 g/dL — ABNORMAL HIGH (ref 6.5–8.1)

## 2021-11-21 LAB — CBC WITH DIFFERENTIAL (CANCER CENTER ONLY)
Abs Immature Granulocytes: 0.01 10*3/uL (ref 0.00–0.07)
Basophils Absolute: 0.1 10*3/uL (ref 0.0–0.1)
Basophils Relative: 2 %
Eosinophils Absolute: 0.4 10*3/uL (ref 0.0–0.5)
Eosinophils Relative: 7 %
HCT: 41.2 % (ref 36.0–46.0)
Hemoglobin: 13.9 g/dL (ref 12.0–15.0)
Immature Granulocytes: 0 %
Lymphocytes Relative: 30 %
Lymphs Abs: 1.9 10*3/uL (ref 0.7–4.0)
MCH: 29.9 pg (ref 26.0–34.0)
MCHC: 33.7 g/dL (ref 30.0–36.0)
MCV: 88.6 fL (ref 80.0–100.0)
Monocytes Absolute: 0.5 10*3/uL (ref 0.1–1.0)
Monocytes Relative: 8 %
Neutro Abs: 3.3 10*3/uL (ref 1.7–7.7)
Neutrophils Relative %: 53 %
Platelet Count: 359 10*3/uL (ref 150–400)
RBC: 4.65 MIL/uL (ref 3.87–5.11)
RDW: 12.5 % (ref 11.5–15.5)
WBC Count: 6.3 10*3/uL (ref 4.0–10.5)
nRBC: 0 % (ref 0.0–0.2)

## 2021-11-21 LAB — GENETIC SCREENING ORDER

## 2021-11-21 NOTE — Progress Notes (Signed)
[facility] CONSULT NOTE  Patient Care Team: Clovis Riley, L.August Saucer, MD as PCP - General (Family Medicine) Donnelly Angelica, RN as Oncology Nurse Navigator Pershing Proud, RN as Oncology Nurse Navigator Almond Lint, MD as Consulting Physician (General Surgery) Rachel Moulds, MD as Consulting Physician (Hematology and Oncology) Dorothy Puffer, MD as Consulting Physician (Radiation Oncology)  CHIEF COMPLAINTS/PURPOSE OF CONSULTATION:  Newly diagnosed breast cancer  HISTORY OF PRESENTING ILLNESS:   Kari Torres 56 y.o. female is here because of recent diagnosis of left breast invasive ductal carcinoma  I reviewed her records extensively and collaborated the history with the patient.  SUMMARY OF ONCOLOGIC HISTORY: Oncology History  Malignant neoplasm of upper-outer quadrant of left breast in female, estrogen receptor positive (HCC)  11/06/2021 Mammogram   Bilateral screening mammogram shows left breast asymmetry which is indeterminate.  Additional views with possible ultrasound are recommended.  Left breast unilateral diagnostic mammogram confirmed a 1.6 x 1.1 cm upper outer aspect middle depth asymmetry with no other significant masses or calcifications.   11/12/2021 Pathology Results   Left breast needle core biopsy at 2:00 shows invasive ductal carcinoma, grade 2 prognostics ER 100% positive strong staining PR 50% positive moderate to strong staining Ki-67 of 5% and group 4 HER2 negative   11/19/2021 Initial Diagnosis   Malignant neoplasm of upper-outer quadrant of left breast in female, estrogen receptor positive (HCC)   11/20/2021 Cancer Staging   Staging form: Breast, AJCC 8th Edition - Clinical stage from 11/20/2021: Stage IA (cT1c, cN0, cM0, G2, ER+, PR+, HER2-) - Signed by Ronny Bacon, PA-C on 11/20/2021 Method of lymph node assessment: Clinical Histologic grading system: 3 grade system    Ms. Maack is here for an initial visit with her friend.  She is  healthy except for previous history of diabetes, hypertension and dyslipidemia.  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  Family history of breast cancer in mom at the age of 56.  No other cancers reported.  She has 2 kids, never took any birth control or hormone replacement therapy.  She is currently an Airline pilot. Rest of the pertinent 10 point ROS reviewed and negative.  MEDICAL HISTORY:  Past Medical History:  Diagnosis Date   Allergic rhinitis due to pollen    Bloody discharge from left nipple 01/29/2017   Breast cancer (HCC)    Diabetes mellitus without complication (HCC)    Hypercholesterolemia    Hypertension     SURGICAL HISTORY: Past Surgical History:  Procedure Laterality Date   BREAST LUMPECTOMY Left 01/29/2017   Procedure: LEFT BREAST LUMPECTOMY SUBAREOLAR DISSECTION ERAS PATHWAY;  Surgeon: Claud Kelp, MD;  Location: [facility];  Service: General;  Laterality: Left;  LMA   BREAST LUMPECTOMY WITH RADIOACTIVE SEED LOCALIZATION Left 09/15/2015   Procedure: LEFT BREAST LUMPECTOMY WITH RADIOACTIVE SEED LOCALIZATION;  Surgeon: Claud Kelp, MD;  Location: [facility];  Service: General;  Laterality: Left;   CESAREAN SECTION     WISDOM TOOTH EXTRACTION      SOCIAL HISTORY: Social History   Socioeconomic History   Marital status: Divorced    Spouse name: Not on file   Number of children: Not on file   Years of education: Not on file   Highest education level: Not on file  Occupational History   Not on file  Tobacco Use   Smoking status: Never   Smokeless tobacco: Never  Substance and Sexual Activity   Alcohol use: Yes  Drug use: No   Sexual activity: Not on file  Other Topics Concern   Not on file  Social History Narrative   Not on file   Social Determinants of Health   Financial Resource Strain: Not on file  Food Insecurity: Not on file  Transportation Needs: Not on file  Physical Activity: Not on file   Stress: Not on file  Social Connections: Not on file  Intimate Partner Violence: Not on file    FAMILY HISTORY: Family History  Problem Relation Age of Onset   Hypertension Mother    Breast cancer Mother    Hypertension Father    Diabetes Father    CAD Father    Hypertension Maternal Grandmother    Diabetes Maternal Grandmother    Congestive Heart Failure Maternal Grandmother    Diabetes Paternal Grandmother    Diabetes Paternal Merchant navy officer    Healthy Sister    Healthy Brother    Healthy Brother    Healthy Brother    Healthy Brother     ALLERGIES:  is allergic to food.  MEDICATIONS:  Current Outpatient Medications  Medication Sig Dispense Refill   aspirin 81 MG tablet Take 81 mg by mouth daily.     atenolol (TENORMIN) 100 MG tablet Take 100 mg by mouth daily.     atorvastatin (LIPITOR) 20 MG tablet Take 20 mg by mouth daily.     diltiazem (DILACOR XR) 240 MG 24 hr capsule Take 240 mg by mouth daily.     FARXIGA 5 MG TABS tablet Take 5 mg by mouth daily.     hydrochlorothiazide (HYDRODIURIL) 25 MG tablet Take 25 mg by mouth daily.     loratadine (CLARITIN) 10 MG tablet Take 10 mg by mouth daily as needed for allergies.     LUTEIN PO Take by mouth.     metFORMIN (GLUCOPHAGE) 500 MG tablet Take 500 mg by mouth 2 (two) times daily with a meal.     Multiple Vitamins-Minerals (CENTRUM ADULTS PO) Take 1 tablet by mouth daily.     omega-3 acid ethyl esters (LOVAZA) 1 g capsule Take by mouth 2 (two) times daily.     conjugated estrogens (PREMARIN) vaginal cream Place 1 Applicatorful vaginally once a week.     HYDROcodone-acetaminophen (NORCO) 5-325 MG tablet Take 1-2 tablets every 6 (six) hours as needed by mouth for moderate pain or severe pain. 20 tablet 0   levonorgestrel (MIRENA) 20 MCG/24HR IUD 1 each by Intrauterine route once.     No current facility-administered medications for this visit.    REVIEW OF SYSTEMS:   Constitutional: Denies fevers, chills or abnormal  night sweats Eyes: Denies blurriness of vision, double vision or watery eyes Ears, nose, mouth, throat, and face: Denies mucositis or sore throat Respiratory: Denies cough, dyspnea or wheezes Cardiovascular: Denies palpitation, chest discomfort or lower extremity swelling Gastrointestinal:  Denies nausea, heartburn or change in bowel habits Skin: Denies abnormal skin rashes Lymphatics: Denies new lymphadenopathy or easy bruising Neurological:Denies numbness, tingling or new weaknesses Behavioral/Psych: Mood is stable, no new changes  Breast: Denies any palpable lumps or discharge All other systems were reviewed with the patient and are negative.  PHYSICAL EXAMINATION: ECOG PERFORMANCE STATUS: 0 - Asymptomatic  Vitals:   11/21/21 0839  BP: (!) 176/92  Pulse: 74  Resp: 18  Temp: 98.1 F (36.7 C)  SpO2: 100%   Filed Weights   11/21/21 0839  Weight: 139 lb 12.8 oz (63.4 kg)    GENERAL:alert, no distress  and comfortable SKIN: skin color, texture, turgor are normal, no rashes or significant lesions EYES: normal, conjunctiva are pink and non-injected, sclera clear OROPHARYNX:no exudate, no erythema and lips, buccal mucosa, and tongue normal  NECK: supple, thyroid normal size, non-tender, without nodularity LYMPH:  no palpable lymphadenopathy in the cervical, axillary or inguinal LUNGS: clear to auscultation and percussion with normal breathing effort HEART: regular rate & rhythm and no murmurs and no lower extremity edema ABDOMEN:abdomen soft, non-tender and normal bowel sounds Musculoskeletal:no cyanosis of digits and no clubbing  PSYCH: alert & oriented x 3 with fluent speech NEURO: no focal motor/sensory deficits BREAST: Left breast with ecchymosis at the biopsy site.  Weak palpable mass but not well-defined.  No other regional adenopathy.  Right breast normal to inspection and palpation  LABORATORY DATA:  I have reviewed the data as listed Lab Results  Component Value Date    WBC 6.3 11/21/2021   HGB 13.9 11/21/2021   HCT 41.2 11/21/2021   MCV 88.6 11/21/2021   PLT 359 11/21/2021   Lab Results  Component Value Date   NA 136 11/21/2021   K 3.8 11/21/2021   CL 97 (L) 11/21/2021   CO2 31 11/21/2021    RADIOGRAPHIC STUDIES: I have personally reviewed the radiological reports and agreed with the findings in the report.  ASSESSMENT AND PLAN:  Malignant neoplasm of upper-outer quadrant of left breast in female, estrogen receptor positive ([place]) This is a very pleasant 56 year old female patient with newly diagnosed left breast grade 2 invasive ductal carcinoma, ER/PR positive, HER2 negative by FISH referred to breast [facility] for additional recommendations.  We have discussed about proceeding with upfront surgery given the small size of the tumor and node negative as well as strong ER/PR positivity.  Following surgery, she will proceed with Oncotype testing.  We have discussed the following details about Oncotype  We have discussed about Oncotype Dx score which is a well validated prognostic scoring system which can predict outcome with endocrine therapy alone and whether chemotherapy reduces recurrence.  Typically in patients with ER positive cancers that are node negative if the RS score is high typically greater than or equal to 26, chemotherapy is recommended.  In women with intermediate recurrence score younger than 16, there can still be some role for chemotherapy in addition to endocrine therapy especially if the recurrence score is between 21-25. If chemotherapy is needed, this will precede radiation and then after radiation she will continue on antiestrogen therapy. If chemotherapy is not needed she will proceed with adjuvant radiation followed by antiestrogen therapy.  We have discussed the following details about antiestrogen therapy.  We have discussed options for antiestrogen therapy today  With regards to Tamoxifen, we discussed that this is a SERM,  selective estrogen receptor modulator. We discussed mechanism of action of Tamoxifen, adverse effects on Tamoxifen including but not limited to post menopausal symptoms, increased risk of DVT/PE, increased risk of endometrial cancer, questionable cataracts with long term use and increased risk of cardiovascular events in the study which was not statistically significant. A benefit from Tamoxifen would be improvement in bone density. With regards to aromatase inhibitors, we discussed mechanism of action, adverse effects including but not limited to post menopausal symptoms, arthralgias, myalgias, increased risk of cardiovascular events and bone loss.   She will return to clinic after completing surgery to review Oncotype results and to discuss additional recommendations.  All her questions were answered to the best of my knowledge. Thank you for consulting  [place] in the care of this patient.  Please do not hesitate to contact us with any additional questions or concerns.    . Total time spent: 45 minutes including history, physical exam, review of records, counseling and coordination of care  All questions were answered. The patient knows to call the clinic with any problems, questions or concerns.    Benay Pike, MD 11/21/21

## 2021-11-21 NOTE — Progress Notes (Signed)
REFERRING PROVIDER: Benay Pike, MD [address] [place],  [place] [address]   PRIMARY PROVIDER:  Alroy Dust, L.Marlou Sa, MD  PRIMARY REASON FOR VISIT:  1. Malignant neoplasm of upper-outer quadrant of left breast in female, estrogen receptor positive ([place])   2. Family history of breast cancer   3. Family history of brain cancer   4. Family history of bladder cancer     HISTORY OF PRESENT ILLNESS:   Kari Torres, a 56 y.o. female, was seen for a [facility] cancer genetics consultation during the breast multidisciplinary clinic at the request of Dr. Alroy Dust due to a personal and family history of breast cancer.  Kari Torres presents to clinic today to discuss the possibility of a hereditary predisposition to cancer, to discuss genetic testing, and to further clarify her future cancer risks, as well as potential cancer risks for family members.   In August 2023, at the age of 68, Kari Torres was diagnosed with invasive ductal carcinoma of the left breast (ER+/PR+/HER2-). The treatment plan is pending.   CANCER HISTORY:  Oncology History  Malignant neoplasm of upper-outer quadrant of left breast in female, estrogen receptor positive ([place])  11/06/2021 Mammogram   Bilateral screening mammogram shows left breast asymmetry which is indeterminate.  Additional views with possible ultrasound are recommended.  Left breast unilateral diagnostic mammogram confirmed a 1.6 x 1.1 cm upper outer aspect middle depth asymmetry with no other significant masses or calcifications.   11/12/2021 Pathology Results   Left breast needle core biopsy at 2:00 shows invasive ductal carcinoma, grade 2 prognostics ER 100% positive strong staining PR 50% positive moderate to strong staining Ki-67 of 5% and group 4 HER2 negative   11/19/2021 Initial Diagnosis   Malignant neoplasm of upper-outer quadrant of left breast in female, estrogen receptor positive ([place])   11/20/2021 Cancer Staging   Staging form: Breast, AJCC 8th Edition -  Clinical stage from 11/20/2021: Stage IA (cT1c, cN0, cM0, G2, ER+, PR+, HER2-) - Signed by Hayden Pedro, PA-C on 11/20/2021 Method of lymph node assessment: Clinical Histologic grading system: 3 grade system      Past Medical History:  Diagnosis Date   Allergic rhinitis due to pollen    Bloody discharge from left nipple 01/29/2017   Breast cancer ([facility])    Diabetes mellitus without complication ([facility])    Family history of breast cancer 11/21/2021   Hypercholesterolemia    Hypertension     Past Surgical History:  Procedure Laterality Date   BREAST LUMPECTOMY Left 01/29/2017   Procedure: LEFT BREAST LUMPECTOMY SUBAREOLAR DISSECTION ERAS PATHWAY;  Surgeon: Fanny Skates, MD;  Location: [facility];  Service: General;  Laterality: Left;  LMA   BREAST LUMPECTOMY WITH RADIOACTIVE SEED LOCALIZATION Left 09/15/2015   Procedure: LEFT BREAST LUMPECTOMY WITH RADIOACTIVE SEED LOCALIZATION;  Surgeon: Fanny Skates, MD;  Location: [facility];  Service: General;  Laterality: Left;   CESAREAN SECTION     WISDOM TOOTH EXTRACTION       FAMILY HISTORY:  We obtained a detailed, 4-generation family history.  Significant diagnoses are listed below: Family History  Problem Relation Age of Onset   Breast cancer Mother 50       met d. 33   Brain cancer Maternal Aunt        dx 73s; d. 53s   Cancer Maternal Uncle        unknown type; ? lung;  dx 60s   Breast cancer Other  MGM's sister with breast cancer or other primary   Brain cancer Cousin        mat female cousin; dx 32s; d. 42s   Bladder Cancer Half-Brother        dx 64s; paternal half brother       Kari Torres is unaware of previous family history of genetic testing for hereditary cancer risks. There is no reported Ashkenazi Jewish ancestry. There is no known consanguinity.  GENETIC COUNSELING ASSESSMENT: Kari Torres is a 56 y.o. female with a personal and family history suggestive of a hereditary  cancer syndrome and predisposition to cancer given the presence of related cancers in the family. We, therefore, discussed and recommended the following at today's visit.   DISCUSSION: We discussed that 5 - 10% of cancer is hereditary, with most cases of hereditary breast cancer associated with mutations in BRCA1/2.  There are other genes that can be associated with hereditary breast or brain cancer syndromes.  Type of cancer risk and level of risk are gene-specific. We discussed that testing is beneficial for several reasons including knowing how to follow individuals after completing their treatment, identifying whether potential treatment options would be beneficial, and understanding if other family members could be at risk for cancer and allowing them to undergo genetic testing.   We reviewed the characteristics, features and inheritance patterns of hereditary cancer syndromes. We also discussed genetic testing, including the appropriate family members to test, the process of testing, insurance coverage and turn-around-time for results. We discussed the implications of a negative, positive and/or variant of uncertain significant result. In order to get genetic test results in a timely manner so that Kari Torres can use these genetic test results for surgical decisions, we recommended Kari Torres pursue genetic testing for the [employer].  The BRCAplus panel offered by [employer] and includes sequencing and deletion/duplication analysis for the following 8 genes: ATM, BRCA1, BRCA2, CDH1, CHEK2, PALB2, PTEN, and TP53. Once complete, we recommend Kari Torres pursue reflex genetic testing to a more comprehensive gene panel.   The CancerNext-Expanded gene panel offered by [facility] and includes sequencing, rearrangement, and RNA analysis for the following 77 genes: AIP, ALK, APC, ATM, AXIN2, BAP1, BARD1, BLM, BMPR1A, BRCA1, BRCA2, BRIP1, CDC73, CDH1, CDK4, CDKN1B, CDKN2A, CHEK2, CTNNA1, DICER1,  FANCC, FH, FLCN, GALNT12, KIF1B, LZTR1, MAX, MEN1, MET, MLH1, MSH2, MSH3, MSH6, MUTYH, NBN, NF1, NF2, NTHL1, PALB2, PHOX2B, PMS2, POT1, PRKAR1A, PTCH1, PTEN, RAD51C, RAD51D, RB1, RECQL, RET, SDHA, SDHAF2, SDHB, SDHC, SDHD, SMAD4, SMARCA4, SMARCB1, SMARCE1, STK11, SUFU, TMEM127, TP53, TSC1, TSC2, VHL and XRCC2 (sequencing and deletion/duplication); EGFR, EGLN1, HOXB13, KIT, MITF, PDGFRA, POLD1, and POLE (sequencing only); EPCAM and GREM1 (deletion/duplication only).   Based on Kari Torres's personal and family history of breast cancer, she meets medical criteria for genetic testing. Despite that she meets criteria, she may still have an out of pocket cost. We discussed that if her out of pocket cost for testing is over $100, the laboratory should contact them to discuss self-pay prices, patient pay assistance programs, if applicable, and other billing options.   PLAN: After considering the risks, benefits, and limitations, Kari Torres provided informed consent to pursue genetic testing and the blood sample was sent to [facility] for analysis of the [facility]us and CancerNext-Expanded +RNAinsight Panel. Results should be available within approximately 1-2 weeks' time, at which point they will be disclosed by telephone to Kari Torres, as will any additional recommendations warranted by these results. Kari Torres will receive  a summary of her genetic counseling visit and a copy of her results once available. This information will also be available in Epic.   Kari Torres questions were answered to her satisfaction today. Our contact information was provided should additional questions or concerns arise. Thank you for the referral and allowing [place] to share in the care of your patient.   Ziyan Hillmer M. Joette Catching, [place], [facility] Genetic Counselor [email or web address] (P) [phone]  The patient was seen for a total of 15 minutes in face-to-face genetic counseling.  Patient was accompanied by her friend.  Dr. Chryl Heck was  available to discuss case as needed.  _______________________________________________________________________ For Office Staff:  Number of people involved in session: 2 Was an Intern/ student involved with case: no

## 2021-11-21 NOTE — Assessment & Plan Note (Signed)
This is a very pleasant 56 year old female patient with newly diagnosed left breast grade 2 invasive ductal carcinoma, ER/PR positive, HER2 negative by FISH referred to breast [facility] for additional recommendations.  We have discussed about proceeding with upfront surgery given the small size of the tumor and node negative as well as strong ER/PR positivity.  Following surgery, she will proceed with Oncotype testing.  We have discussed the following details about Oncotype  We have discussed about Oncotype Dx score which is a well validated prognostic scoring system which can predict outcome with endocrine therapy alone and whether chemotherapy reduces recurrence.  Typically in patients with ER positive cancers that are node negative if the RS score is high typically greater than or equal to 26, chemotherapy is recommended.  In women with intermediate recurrence score younger than 78, there can still be some role for chemotherapy in addition to endocrine therapy especially if the recurrence score is between 21-25. If chemotherapy is needed, this will precede radiation and then after radiation she will continue on antiestrogen therapy. If chemotherapy is not needed she will proceed with adjuvant radiation followed by antiestrogen therapy.  We have discussed the following details about antiestrogen therapy.  We have discussed options for antiestrogen therapy today  With regards to Tamoxifen, we discussed that this is a SERM, selective estrogen receptor modulator. We discussed mechanism of action of Tamoxifen, adverse effects on Tamoxifen including but not limited to post menopausal symptoms, increased risk of DVT/PE, increased risk of endometrial cancer, questionable cataracts with long term use and increased risk of cardiovascular events in the study which was not statistically significant. A benefit from Tamoxifen would be improvement in bone density. With regards to aromatase inhibitors, we discussed mechanism  of action, adverse effects including but not limited to post menopausal symptoms, arthralgias, myalgias, increased risk of cardiovascular events and bone loss.   She will return to clinic after completing surgery to review Oncotype results and to discuss additional recommendations.  All her questions were answered to the best of my knowledge. Thank you for consulting [place] in the care of this patient.  Please do not hesitate to contact us with any additional questions or concerns.    Marland Kitchen

## 2021-11-21 NOTE — Progress Notes (Signed)
[facility]  Initial Assessment   Kari Torres is a 56 y.o. year old female accompanied by friend, Kari Torres. Clinical Social Work was referred by  [facility]  for assessment of psychosocial needs.   SDOH (Social Determinants of Health) assessments performed: Yes SDOH Interventions    Flowsheet Row Most Recent Value  SDOH Interventions   Food Insecurity Interventions Intervention Not Indicated  Financial Strain Interventions Intervention Not Indicated  Housing Interventions Intervention Not Indicated  Transportation Interventions Intervention Not Indicated       SDOH Screenings   Alcohol Screen: Not on file  Depression (PXT0-6): Not on file  Financial Resource Strain: Low Risk  (11/21/2021)   Overall Financial Resource Strain (CARDIA)    Difficulty of Paying Living Expenses: Not very hard  Food Insecurity: No Food Insecurity (11/21/2021)   Hunger Vital Sign    Worried About Running Out of Food in the Last Year: Never true    Ran Out of Food in the Last Year: Never true  Housing: Low Risk  (11/21/2021)   Housing    Last Housing Risk Score: 0  Physical Activity: Not on file  Social Connections: Not on file  Stress: Not on file  Tobacco Use: Low Risk  (11/21/2021)   Patient History    Smoking Tobacco Use: Never    Smokeless Tobacco Use: Never    Passive Exposure: Not on file  Transportation Needs: No Transportation Needs (11/21/2021)   PRAPARE - Transportation    Lack of Transportation (Medical): No    Lack of Transportation (Non-Medical): No     Distress Screen completed: Yes    11/21/2021   12:48 PM  ONCBCN DISTRESS SCREENING  Screening Type Initial Screening  Distress experienced in past week (1-10) 5  Emotional problem type Nervousness/Anxiety  Information Concerns Type Lack of info about diagnosis;Lack of info about treatment  Physical Problem type Sleep/insomnia      Family/Social Information:  Housing Arrangement: patient lives with her son, Kari Torres  (41yo) Family members/support persons in your life? 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  Income source: Employment Financial concerns: No Type of concern: None Food access concerns: no Religious or spiritual practice: Not known Services Currently in place:  n/a  Coping/ Adjustment to diagnosis: Patient understands treatment plan and what happens next? yes, feels much better after meeting with the medical team and learning more about diagnosis and plan Concerns about diagnosis and/or treatment:  telling her kids about dx Patient reported stressors: Anxiety/ nervousness Patient enjoys time with family/ friends and her work, zumba, dance/music Current coping skills/ strengths: Ability for insight , Capable of independent living , Communication skills , Special hobby/interest , and Supportive family/friends     SUMMARY: Current SDOH Barriers:  No significant SDOH needs at this time  Clinical Social Work Clinical Goal(s):  No clinical social work goals at this time  Interventions: Discussed common feeling and emotions when being diagnosed with cancer, and the importance of support during treatment Informed patient of the support team roles and support services at [facility] Provided [facility] contact information and encouraged patient to call with any questions or concerns   Follow Up Plan: Patient will contact CSW with any support or resource needs Patient verbalizes understanding of plan: Yes    Irvan Tiedt E Karlene Southard, LCSW

## 2021-11-21 NOTE — Therapy (Signed)
OUTPATIENT PHYSICAL THERAPY BREAST CANCER BASELINE EVALUATION   Patient Name: Kari Torres MRN: [id number] DOB:May 04, 1965, 56 y.o., female Today's Date: 11/21/2021   PT End of Session - 11/21/21 4656     Visit Number 1    Number of Visits 2    Date for PT Re-Evaluation 01/16/22    PT Start Time 1040    PT Stop Time 1113    PT Time Calculation (min) 33 min    Activity Tolerance Patient tolerated treatment well    Behavior During Therapy [facility] for tasks assessed/performed             Past Medical History:  Diagnosis Date   Allergic rhinitis due to pollen    Bloody discharge from left nipple 01/29/2017   Breast cancer ([place])    Diabetes mellitus without complication ([place])    Hypercholesterolemia    Hypertension    Past Surgical History:  Procedure Laterality Date   BREAST LUMPECTOMY Left 01/29/2017   Procedure: LEFT BREAST LUMPECTOMY SUBAREOLAR DISSECTION ERAS PATHWAY;  Surgeon: Fanny Skates, MD;  Location: [place];  Service: General;  Laterality: Left;  LMA   BREAST LUMPECTOMY WITH RADIOACTIVE SEED LOCALIZATION Left 09/15/2015   Procedure: LEFT BREAST LUMPECTOMY WITH RADIOACTIVE SEED LOCALIZATION;  Surgeon: Fanny Skates, MD;  Location: [place];  Service: General;  Laterality: Left;   [place] EXTRACTION     Patient Active Problem List   Diagnosis Date Noted   Malignant neoplasm of upper-outer quadrant of left breast in female, estrogen receptor positive ([place]) 11/19/2021   Bloody discharge from left nipple 01/29/2017   Uncontrolled diabetes mellitus type 2 without complications 81/27/5170   Benign essential hypertension 06/22/2015   Hypercholesterolemia 06/22/2015   Allergic rhinitis due to pollen 06/22/2015   Chest pain 06/22/2015    REFERRING PROVIDER: Dr. Stark Klein  REFERRING DIAG: Left breast cancer  THERAPY DIAG:  Malignant neoplasm of upper-outer quadrant of left breast in female, estrogen  receptor positive ([place])  Abnormal posture  Rationale for Evaluation and Treatment Rehabilitation  ONSET DATE: 11/12/2021  SUBJECTIVE                                                                                                                                                                                           SUBJECTIVE STATEMENT: Patient reports she is here today to be seen by her medical team for her newly diagnosed left breast cancer.   PERTINENT HISTORY:  Patient was diagnosed on 11/12/2021 with left grade 2 invasive ductal carcinoma breast cancer. It measures 1.6 cm and is located in the  upper outer quadrant. It is ER/PR positive and HER2 negative with a Ki67 of 5%. She also has diabetes and hypertension.  PATIENT GOALS   reduce lymphedema risk and learn post op HEP.   PAIN:  Are you having pain? No   PRECAUTIONS: Active CA   HAND DOMINANCE: right  WEIGHT BEARING RESTRICTIONS No  FALLS:  Has patient fallen in last 6 months? No  LIVING ENVIRONMENT: Patient lives with: her 46 y.o. son Lives in: House/apartment Has following equipment at home: None  OCCUPATION: She is an Optometrist  LEISURE: She does Zumba 3x/week and walks for an hour twice a week  PRIOR LEVEL OF FUNCTION: Independent   OBJECTIVE  COGNITION:  Overall cognitive status: Within functional limits for tasks assessed    POSTURE:  Forward head and rounded shoulders posture  UPPER EXTREMITY AROM/PROM:  A/PROM RIGHT   eval   Shoulder extension 46  Shoulder flexion 156  Shoulder abduction 160  Shoulder internal rotation 79  Shoulder external rotation 90    (Blank rows = not tested)  A/PROM LEFT   eval  Shoulder extension 60  Shoulder flexion 160  Shoulder abduction 171  Shoulder internal rotation 72  Shoulder external rotation 90    (Blank rows = not tested)   CERVICAL AROM: All within normal limits  UPPER EXTREMITY STRENGTH: WNL   LYMPHEDEMA ASSESSMENTS:   LANDMARK  RIGHT   eval  10 cm proximal to olecranon process 24.2  Olecranon process 22.5  10 cm proximal to ulnar styloid process 18  Just proximal to ulnar styloid process 13.3  Across hand at thumb web space 17.7  At base of 2nd digit 5.6  (Blank rows = not tested)  LANDMARK LEFT   eval  10 cm proximal to olecranon process 24.6  Olecranon process 22.5  10 cm proximal to ulnar styloid process 17.3  Just proximal to ulnar styloid process 13.3  Across hand at thumb web space 16.8  At base of 2nd digit 5.5  (Blank rows = not tested)   L-DEX LYMPHEDEMA SCREENING:  The patient was assessed using the L-Dex machine today to produce a lymphedema index baseline score. The patient will be reassessed on a regular basis (typically every 3 months) to obtain new L-Dex scores. If the score is > 6.5 points away from his/her baseline score indicating onset of subclinical lymphedema, it will be recommended to wear a compression garment for 4 weeks, 12 hours per day and then be reassessed. If the score continues to be > 6.5 points from baseline at reassessment, we will initiate lymphedema treatment. Assessing in this manner has a 95% rate of preventing clinically significant lymphedema.   L-DEX FLOWSHEETS - 11/21/21 0900       L-DEX LYMPHEDEMA SCREENING   Measurement Type Unilateral    L-DEX MEASUREMENT EXTREMITY Upper Extremity    POSITION  Standing    DOMINANT SIDE Right    At Risk Side Left    BASELINE SCORE (UNILATERAL) 0.6              QUICK DASH SURVEY:  Katina Dung - 11/21/21 0001     Do heavy household chores (wash walls, wash floors) No difficulty    Carry a shopping bag or briefcase No difficulty    Wash your back No difficulty    Use a knife to cut food No difficulty    Recreational activities in which you take some force or impact through your arm, shoulder, or hand (golf, hammering, tennis) No  difficulty    During the past week, to what extent has your arm, shoulder or hand problem  interfered with your normal social activities with family, friends, neighbors, or groups? Not at all    During the past week, to what extent has your arm, shoulder or hand problem limited your work or other regular daily activities Not at all    Arm, shoulder, or hand pain. None    Tingling (pins and needles) in your arm, shoulder, or hand None    Difficulty Sleeping No difficulty    DASH Score -2.27 %              PATIENT EDUCATION:  Education details: Lymphedema risk reduction and post op shoulder/posture HEP Person educated: Patient Education method: Explanation, Demonstration, Handout Education comprehension: Patient verbalized understanding and returned demonstration   HOME EXERCISE PROGRAM: Patient was instructed today in a home exercise program today for post op shoulder range of motion. These included active assist shoulder flexion in sitting, scapular retraction, wall walking with shoulder abduction, and hands behind head external rotation.  She was encouraged to do these twice a day, holding 3 seconds and repeating 5 times when permitted by her physician.   ASSESSMENT:  CLINICAL IMPRESSION: Patient was diagnosed on 11/12/2021 with left grade 2 invasive ductal carcinoma breast cancer. It measures 1.6 cm and is located in the upper outer quadrant. It is ER/PR positive and HER2 negative with a Ki67 of 5%. Her multidisciplinary medical team met prior to her assessments to determine a recommended treatment plan. She is planning to have a left lumpectomy and sentinel node biopsy followed by Oncotype testing, radiation, and anti-estrogen therapy. She will benefit from a post op PT reassessment to determine needs and from L-Dex screens every 3 months for 2 years to detect subclinical lymphedema.  Pt will benefit from skilled therapeutic intervention to improve on the following deficits: Decreased knowledge of precautions, impaired UE functional use, pain, decreased ROM, postural  dysfunction.   PT treatment/interventions: ADL/self-care home management, pt/family education, therapeutic exercise  REHAB POTENTIAL: Excellent  CLINICAL DECISION MAKING: Stable/uncomplicated  EVALUATION COMPLEXITY: Low   GOALS: Goals reviewed with patient? YES  LONG TERM GOALS: (STG=LTG)    Name Target Date Goal status  1 Pt will be able to verbalize understanding of pertinent lymphedema risk reduction practices relevant to her dx specifically related to skin care.  Baseline:  No knowledge 11/21/2021 Achieved at eval  2 Pt will be able to return demo and/or verbalize understanding of the post op HEP related to regaining shoulder ROM. Baseline:  No knowledge 11/21/2021 Achieved at eval  3 Pt will be able to verbalize understanding of the importance of attending the post op After Breast CA Class for further lymphedema risk reduction education and therapeutic exercise.  Baseline:  No knowledge 11/21/2021 Achieved at eval  4 Pt will demo she has regained full shoulder ROM and function post operatively compared to baselines.  Baseline: See objective measurements taken today. 01/16/2022      PLAN: PT FREQUENCY/DURATION: EVAL and 1 follow up appointment.   PLAN FOR NEXT SESSION: will reassess 3-4 weeks post op to determine needs.   Patient will follow up at outpatient cancer rehab 3-4 weeks following surgery.  If the patient requires physical therapy at that time, a specific plan will be dictated and sent to the referring physician for approval. The patient was educated today on appropriate basic range of motion exercises to begin post operatively and the importance of attending  the After Breast Cancer class following surgery.  Patient was educated today on lymphedema risk reduction practices as it pertains to recommendations that will benefit the patient immediately following surgery.  She verbalized good understanding.    Physical Therapy Information for After Breast Cancer  Surgery/Treatment:  Lymphedema is a swelling condition that you may be at risk for in your arm if you have lymph nodes removed from the armpit area.  After a sentinel node biopsy, the risk is approximately 5-9% and is higher after an axillary node dissection.  There is treatment available for this condition and it is not life-threatening.  Contact your physician or physical therapist with concerns. You may begin the 4 shoulder/posture exercises (see additional sheet) when permitted by your physician (typically a week after surgery).  If you have drains, you may need to wait until those are removed before beginning range of motion exercises.  A general recommendation is to not lift your arms above shoulder height until drains are removed.  These exercises should be done to your tolerance and gently.  This is not a "no pain/no gain" type of recovery so listen to your body and stretch into the range of motion that you can tolerate, stopping if you have pain.  If you are having immediate reconstruction, ask your plastic surgeon about doing exercises as he or she may want you to wait. We encourage you to attend the free one time ABC (After Breast Cancer) class offered by [facility].  You will learn information related to lymphedema risk, prevention and treatment and additional exercises to regain mobility following surgery.  You can call [phone] for more information.  This is offered the 1st and 3rd Monday of each month.  You only attend the class one time. While undergoing any medical procedure or treatment, try to avoid blood pressure being taken or needle sticks from occurring on the arm on the side of cancer.   This recommendation begins after surgery and continues for the rest of your life.  This may help reduce your risk of getting lymphedema (swelling in your arm). An excellent resource for those seeking information on lymphedema is the National Lymphedema Network's web site. It  can be accessed at [facility] If you notice swelling in your hand, arm or breast at any time following surgery (even if it is many years from now), please contact your doctor or physical therapist to discuss this.  Lymphedema can be treated at any time but it is easier for you if it is treated early on.  If you feel like your shoulder motion is not returning to normal in a reasonable amount of time, please contact your surgeon or physical therapist.  [facility] [phone]. [address], [place] [address]  ABC CLASS After Breast Cancer Class  After Breast Cancer Class is a specially designed exercise class to assist you in a safe recover after having breast cancer surgery.  In this class you will learn how to get back to full function whether your drains were just removed or if you had surgery a month ago.  This one-time class is held the 1st and 3rd Monday of every month from 11:00 a.m. until 12:00 noon virtually.  This class is FREE and space is limited. For more information or to register for the next available class, call [phone].  Class Goals  Understand specific stretches to improve the flexibility of you chest and shoulder. Learn ways  to safely strengthen your upper body and improve your posture. Understand the warning signs of infection and why you may be at risk for an arm infection. Learn about Lymphedema and prevention.  ** You do not attend this class until after surgery.  Drains must be removed to participate  Patient was instructed today in a home exercise program today for post op shoulder range of motion. These included active assist shoulder flexion in sitting, scapular retraction, wall walking with shoulder abduction, and hands behind head external rotation.  She was encouraged to do these twice a day, holding 3 seconds and repeating 5 times when permitted by her physician.  Annia Friendly, [place] 11/21/21  11:48 AM

## 2021-11-21 NOTE — Research (Signed)
[employer] 2021-05 - Specimen Collection Study to Evaluate Biomarkers in Subjects with Cancer    Patient Kari Torres was identified by Dr. Chryl Heck as a potential candidate for the above listed study.  This Clinical Research Nurse met with [name], [id number], on 11/21/21 in a manner and location that ensures patient privacy to discuss participation in the above listed research study.  Patient is Accompanied by her friend .  A copy of the informed consent document with embedded HIPAA language was provided to the patient.  Patient reads, speaks, and understands [place].   Patient was provided with the business card of this Nurse and encouraged to contact the research team with any questions.  Approximately 20 minutes was spent with the patient reviewing the informed consent documents.  Patient was provided the option of taking informed consent documents home to review and was encouraged to review at their convenience with their support network, including other care providers. Patient took the consent documents home to review.  The pt asked the nurse to call her on Tuesday, 11/27/21 to discuss her participation.  The pt was thanked for her interest in the study. Brion Aliment RN, BSN, CCRP Clinical Research Nurse Lead 11/21/2021 11:39 AM

## 2021-11-23 ENCOUNTER — Encounter: Payer: Self-pay | Admitting: *Deleted

## 2021-11-23 ENCOUNTER — Telehealth: Payer: Self-pay | Admitting: *Deleted

## 2021-11-23 NOTE — Telephone Encounter (Signed)
Left vm regarding [facility] from 11/21/21. Contact information provided for questions or needs.

## 2021-11-27 ENCOUNTER — Telehealth: Payer: Self-pay | Admitting: *Deleted

## 2021-11-27 ENCOUNTER — Other Ambulatory Visit: Payer: Self-pay | Admitting: *Deleted

## 2021-11-27 DIAGNOSIS — Z170 Estrogen receptor positive status [ER+]: Secondary | ICD-10-CM

## 2021-11-27 NOTE — Telephone Encounter (Signed)
[employer] 2021-05 - Specimen Collection Study to Evaluate Biomarkers in Subjects with Cancer   The research nurse called the pt to see if she had read over the consent form that was given to her last week.  The pt said that she had read some of the consent form, and she wanted to participate in the specimen study.  The pt agreed to meet with the research nurse on  Friday, 11/30/21 at 10:00 am to review and sign the consent form. The pt will have her research samples drawn after she signs the consent form.  The pt is scheduled for her surgery next week on 12/06/21.  The pt was thanked for her interest in this specimen study.  Brion Aliment RN, BSN, CCRP Clinical Research Nurse Lead 11/27/2021 10:15 AM

## 2021-11-28 ENCOUNTER — Encounter (HOSPITAL_BASED_OUTPATIENT_CLINIC_OR_DEPARTMENT_OTHER): Payer: Self-pay | Admitting: General Surgery

## 2021-11-30 ENCOUNTER — Encounter: Payer: Self-pay | Admitting: *Deleted

## 2021-11-30 ENCOUNTER — Other Ambulatory Visit: Payer: Self-pay

## 2021-11-30 ENCOUNTER — Inpatient Hospital Stay: Payer: Managed Care, Other (non HMO) | Attending: Hematology and Oncology | Admitting: *Deleted

## 2021-11-30 ENCOUNTER — Inpatient Hospital Stay: Payer: Managed Care, Other (non HMO)

## 2021-11-30 ENCOUNTER — Telehealth: Payer: Self-pay | Admitting: Genetic Counselor

## 2021-11-30 ENCOUNTER — Encounter: Payer: Self-pay | Admitting: Genetic Counselor

## 2021-11-30 DIAGNOSIS — Z1509 Genetic susceptibility to other malignant neoplasm: Secondary | ICD-10-CM

## 2021-11-30 DIAGNOSIS — Z170 Estrogen receptor positive status [ER+]: Secondary | ICD-10-CM

## 2021-11-30 DIAGNOSIS — C50412 Malignant neoplasm of upper-outer quadrant of left female breast: Secondary | ICD-10-CM | POA: Insufficient documentation

## 2021-11-30 DIAGNOSIS — Z1379 Encounter for other screening for genetic and chromosomal anomalies: Secondary | ICD-10-CM | POA: Insufficient documentation

## 2021-11-30 DIAGNOSIS — Z7984 Long term (current) use of oral hypoglycemic drugs: Secondary | ICD-10-CM | POA: Insufficient documentation

## 2021-11-30 DIAGNOSIS — Z1501 Genetic susceptibility to malignant neoplasm of breast: Secondary | ICD-10-CM | POA: Insufficient documentation

## 2021-11-30 DIAGNOSIS — Z7982 Long term (current) use of aspirin: Secondary | ICD-10-CM | POA: Insufficient documentation

## 2021-11-30 DIAGNOSIS — I10 Essential (primary) hypertension: Secondary | ICD-10-CM | POA: Insufficient documentation

## 2021-11-30 DIAGNOSIS — Z79899 Other long term (current) drug therapy: Secondary | ICD-10-CM | POA: Insufficient documentation

## 2021-11-30 HISTORY — DX: Genetic susceptibility to other malignant neoplasm: Z15.09

## 2021-11-30 LAB — RESEARCH LABS

## 2021-11-30 NOTE — Research (Signed)
[employer] 2021-05 - Specimen Collection Study to Evaluate Biomarkers in Subjects with Cancer  Patient Kari Torres was identified by Dr. Chryl Heck as a potential candidate for the above listed study.  This Clinical Research Nurse met with Kari Torres, [name] on 11/30/21 in a manner and location that ensures patient privacy to discuss participation in the above listed research study.  Patient is Unaccompanied.  Patient was previously provided with informed consent documents.  Patient confirmed they have read the informed consent documents.  As outlined in the informed consent form, this Nurse and [name] discussed the purpose of the research study, the investigational nature of the study, study procedures and requirements for study participation, potential risks and benefits of study participation, as well as alternatives to participation.  This study is not blinded or double-blinded. The patient understands participation is voluntary and they may withdraw from study participation at any time.  This study does not involve randomization.  This study does not involve an investigational drug or device. This study does not involve a placebo. Patient understands enrollment is pending full eligibility review.   Confidentiality and how the patient's information will be used as part of study participation were discussed.  Patient was informed there is reimbursement provided for their time and effort spent on trial participation.  The patient is encouraged to discuss research study participation with their insurance provider to determine what costs they may incur as part of study participation, including research related injury.    All questions were answered to patient's satisfaction.  The informed consent with embedded HIPAA language was reviewed page by page.  The patient's mental and emotional status is appropriate to provide informed consent, and the patient verbalizes an understanding of study  participation.  Patient has agreed to participate in the above listed research study and has voluntarily signed the informed consent version [employer] approved Version [id number] (revised [id number]) with embedded HIPAA language, version [employer] version [id number] (revised [id number])  on 11/30/21 at 10:30AM.  The patient was provided with a copy of the signed informed consent form with embedded HIPAA language for their reference.  No study specific procedures were obtained prior to the signing of the informed consent document.  Approximately 20 minutes were spent with the patient reviewing the informed consent documents.  Patient was not requested to complete a Release of Information form.   Eligibility: This Nurse has reviewed this patient's inclusion and exclusion criteria with the patient, and the nurse has confirmed Kari Torres is eligible for study participation.  2nd eligibility reviewed by Foye Spurling, research nurse.  She confirmed that this pt met all criteria for enrollment.  Eligibility confirmed by treating investigator, who also agrees that patient should proceed with enrollment.   Medical History Data: needed for the study provided by the patient on 11/30/21.   Medical History:  High Blood Pressure  Yes Coronary Artery Disease No Lupus    No Rheumatoid Arthritis  No Diabetes   Yes      If yes, which type?      Type 2 Lynch Syndrome  No  Is the patient currently taking a magnesium supplement?   No  Does the patient have a personal history of cancer (greater than 5 years ago)?  No  Does the patient have a family history of cancer in 1st or 2nd degree relatives? Yes 1st degree- Mother, breast cancer 2nd degree half- brother - bladder cancer  Does the patient have history of alcohol  consumption? Yes   If yes, current or former? current  Number of years? 40  Drinks per week? 2 or less  Does the patient have history of cigarette, cigar, pipe, or chewing tobacco use?  No    Blood Collection:  Research blood obtained by [facility] venipuncture.  Patient tolerated well without any adverse events.   Gift Card: $50 gift card given to patient for her participation in this study by Farris Has, [employer].    Patient was thanked for her support and participation in this specimen/data collection study. Research has requested pt's tumor block from pathology for submission to this study.  Research nurse to begin date entry.   Brion Aliment RN, BSN, CCRP Clinical Research Nurse Lead 11/30/2021 10:54 AM

## 2021-11-30 NOTE — Telephone Encounter (Signed)
Disclosed PALB2 mutation.  Discussed breast, ovarian, and pancreatic cancer risks.  Discussed options for risk reducing mastectomy for PALB2 carriers or high risk screening.  Laporshia not interested in considering RRM at this time but may consider in the future.  She wishes to proceed with surgery as planned next week.

## 2021-11-30 NOTE — Research (Signed)
[employer] 2021-05 - Specimen Collection Study to Evaluate Biomarkers in Subjects with Cancer   This Nurse has reviewed this patient's inclusion and exclusion criteria as a second review and confirms Kari Torres is eligible for study participation.  Patient may continue with enrollment.  Foye Spurling, BSN, RN, CCRP Clinical Research Nurse II 11/30/2021 10:37 AM

## 2021-12-04 ENCOUNTER — Encounter (HOSPITAL_BASED_OUTPATIENT_CLINIC_OR_DEPARTMENT_OTHER)
Admission: RE | Admit: 2021-12-04 | Discharge: 2021-12-04 | Disposition: A | Discharge status: Home / Self Care | Payer: Managed Care, Other (non HMO) | Source: Ambulatory Visit | Attending: General Surgery | Admitting: General Surgery

## 2021-12-04 DIAGNOSIS — Z01810 Encounter for preprocedural cardiovascular examination: Secondary | ICD-10-CM | POA: Insufficient documentation

## 2021-12-04 NOTE — H&P (Signed)
REFERRING PHYSICIAN:  Luan Pulling   PROVIDER:  Georgianne Fick, MD   Care Team: Patient Care Team: Maurine Cane, MD as PCP - General (Family Medicine) Georgianne Fick, MD as Consulting Provider (Surgical Oncology) Iruku, Flo Shanks, MD (Hematology and Oncology) Marye Round, MD (Radiation Oncology) Allyson Sabal, MD (Radiology)    MRN: [id number] DOB: 1965/12/05 DATE OF ENCOUNTER: 11/21/2021   Subjective    Chief Complaint: Breast Cancer     History of Present Illness: Kari Torres is a 56 y.o. female who is seen today as an office consultation at the request of Dr. Chryl Heck for evaluation of Breast Cancer    Pt presented with new left breast cancer 10/2021.  She presented with a focal asymmetry in the upper outer left breast.  Diagnostic imaging confirmed this, demonstrating a 1.6 cm mass at 2 o'clock on the left.  Core needle biopsy was performed.  This showed grade 2 invasive mammary carcinoma, ductal phenotype, +/+/-, Ki 67 5%.     Patient has previously had surgical resections on the left including a CSL and a papilloma.  She has had a slight change in the breast contour after the surgeries.  This is not new.  Patient has a family history of a mother who had breast cancer at age 63.  A half brother had bladder cancer at age 89.  Patient had menarche at age 62.  She is postmenopausal and had menopause around age 71.  She did not take hormonal contraception or hormone replacement.  She is a G2, P2 with the first child at age 11.   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   Diagnostic mammogram/ultrasound:  11/08/2021 [facility].  Focal asymmetry in the left breast upper outer aspect middle depth that measures 1.6 x 1.1 cm.  No significant masses.  Ultrasound correlated with that with an indistinct mass of similar size in the upper outer  quadrant at 2:00.  This was 6 cm from the nipple and hypoechoic.  Axilla was negative on ultrasound.   Pathology core needle biopsy: 11/13/2019 Breast, left, needle core biopsy, 2 o'clock, 6 - 7cmfn - INVASIVE MAMMARY CARCINOMA, SEE NOTE - TUBULE FORMATION: SCORE 3 - NUCLEAR PLEOMORPHISM: SCORE 3 - MITOTIC COUNT: SCORE 1 - TOTAL SCORE: 7 - OVERALL GRADE: 2 Immunohistochemical stain for E-cadherin is positive in the tumor cells, consistent with a ductal phenotype.   Receptors: Estrogen Receptor: 100%, POSITIVE, STRONG STAINING INTENSITY Progesterone Receptor: 50%, POSITIVE, MODERATE-STRONG STAINING INTENSITY Proliferation Marker Ki67: 5% GROUP 4: HER2 **NEGATIVE**    Review of Systems: A complete review of systems was obtained from the patient.  I have reviewed this information and discussed as appropriate with the patient.  See HPI as well for other ROS. Pt has had night sweats associated with menopause       Medical History: Past Medical History      Past Medical History:  Diagnosis Date   Anxiety     Diabetes mellitus without complication (CMS-HCC)     History of cancer     Hypertension             Patient Active Problem List  Diagnosis   Malignant neoplasm of upper-outer quadrant of left breast in female, estrogen receptor positive (CMS-HCC)   Type 2 diabetes mellitus without complication, without long-term current use of insulin (CMS-HCC)  Past Surgical History       Past Surgical History:  Procedure Laterality Date   Breast Lumpectomy Left 09/15/2015   Breast Lumpectomy Left 01/29/2017   CESAREAN SECTION N/A      Date Unknown        Allergies  No Known Allergies           Current Outpatient Medications on File Prior to Visit  Medication Sig Dispense Refill   FARXIGA 5 mg Tab tablet Take 1 tablet by mouth once daily       triamcinolone 0.1 % ointment APPLY THIN COAT TO AFFECTED AREA TWICE A DAY       aspirin 81 MG EC tablet Take 81 mg by mouth once  daily       atenoloL (TENORMIN) 100 MG tablet 1 TABLET BY MOUTH ONCE A DAY 90 DAY(S)       atorvastatin (LIPITOR) 20 MG tablet Take 1 tablet by mouth once daily       conjugated estrogens (PREMARIN) 0.625 mg/gram vaginal cream Place vaginally       dilTIAZem (CARDIZEM) 120 MG tablet Take 2 tablets every day by oral route.       hydroCHLOROthiazide (HYDRODIURIL) 25 MG tablet 1 TABLET IN THE MORNING BY MOUTH ONCE A DAY 90 DAY(S)       levonorgestreL (MIRENA 52 MG) IUD Insert into the uterus       loratadine (CLARITIN) 10 mg tablet Take by mouth       metFORMIN (GLUCOPHAGE) 1000 MG tablet 1 TABLET WITH A MEAL BY MOUTH TWICE A DAY       omega-3 acid ethyl esters (LOVAZA) 1 gram capsule Take by mouth       TIADYLT ER 240 mg ER capsule 1 CAPSULE BY MOUTH ONCE A DAY 90 DAYS        No current facility-administered medications on file prior to visit.      Family History       Family History  Problem Relation Age of Onset   High blood pressure (Hypertension) Mother     Breast cancer Mother     High blood pressure (Hypertension) Father     Diabetes Father          Social History       Tobacco Use  Smoking Status Never  Smokeless Tobacco Never      Social History  Social History        Socioeconomic History   Marital status: Single  Tobacco Use   Smoking status: Never   Smokeless tobacco: Never  Vaping Use   Vaping Use: Never used  Substance and Sexual Activity   Alcohol use: Never   Drug use: Never      Objective:       Vitals:    11/21/21 1105  BP: (!) 176/92  Pulse: 74  Resp: 18  Temp: 36.7 C (98.1 F)  Weight: 63.4 kg (139 lb 12.8 oz)  Height: 142.2 cm ($RemoveB'4\' 8"'woDowxSJ$ )    Body mass index is 31.34 kg/m.     Gen:  No acute distress.  Well nourished and well groomed.   Neurological: Alert and oriented to person, place, and time. Coordination normal.  Head: Normocephalic and atraumatic.  Eyes: Conjunctivae are normal. Pupils are equal, round, and reactive to light. No  scleral icterus.  Neck: Normal range of motion. Neck supple. No tracheal deviation or thyromegaly present.  Cardiovascular: Normal rate, regular rhythm, normal heart sounds and intact distal pulses.  Exam  reveals no gallop and no friction rub.  No murmur heard. Breast: left breast sl smaller than right.  Faint bruising left breast laterally.  Slight alteration in nipple direction stable since last surgery.  No palpable masses.  No nipple discharge.  No LAD.  Right breast benign.  Respiratory: Effort normal.  No respiratory distress. No chest wall tenderness. Breath sounds normal.  No wheezes, rales or rhonchi.  GI: Soft. Bowel sounds are normal. The abdomen is soft and nontender.  There is no rebound and no guarding.  Musculoskeletal: Normal range of motion. Extremities are nontender.  Lymphadenopathy: No cervical, preauricular, postauricular or axillary adenopathy is present Skin: Skin is warm and dry. No rash noted. No diaphoresis. No erythema. No pallor. No clubbing, cyanosis, or edema.   Psychiatric: Normal mood and affect. Behavior is normal. Judgment and thought content normal.     Labs 11/21/2021 CBC normal CMET with gluc 203, elevated Ca at 10.8   Assessment and Plan:        ICD-10-CM    1. Malignant neoplasm of upper-outer quadrant of left breast in female, estrogen receptor positive (CMS-HCC)  C50.412      Z17.0       2. Type 2 diabetes mellitus without complication, without long-term current use of insulin (CMS-HCC)  E11.9         Pt has a new diagnosis of cT1cN0 left breast cancer.  She is a candidate for seed localized lumpectomy and sentinel node biopsy.  This would be followed by oncotype, radiation, and antihormonal tx.     She is offered genetic testing which will be done today.   The surgical procedure was described to the patient.  I discussed the incision type and location and that we will need radiology involved on with a seed marker 1-2 days pre op.       We  discussed the risks bleeding, infection, damage to other structures, need for further procedures/surgeries.  We discussed the risk of seroma.  The patient was advised if the breast has cancer, we may need to go back to surgery for additional tissue to obtain negative margins. The patient was advised that these are the most common complications, but that others can occur as well. I discussed the risk of alteration in breast contour or size.  I discussed risk of chronic pain.  There are rare instances of heart/lung issues post op as well as blood clots.     I discussed post op restrictions.     They were advised against taking aspirin or other anti-inflammatory agents/blood thinners the week before surgery.     The risks and benefits of the procedure were described to the patient and she wishes to proceed.

## 2021-12-04 NOTE — Progress Notes (Signed)

## 2021-12-06 ENCOUNTER — Ambulatory Visit (HOSPITAL_BASED_OUTPATIENT_CLINIC_OR_DEPARTMENT_OTHER)
Admission: RE | Admit: 2021-12-06 | Discharge: 2021-12-06 | Disposition: A | Discharge status: Home / Self Care | Payer: Managed Care, Other (non HMO) | Attending: General Surgery | Admitting: General Surgery

## 2021-12-06 ENCOUNTER — Other Ambulatory Visit: Payer: Self-pay

## 2021-12-06 ENCOUNTER — Encounter (HOSPITAL_BASED_OUTPATIENT_CLINIC_OR_DEPARTMENT_OTHER): Payer: Self-pay | Admitting: General Surgery

## 2021-12-06 ENCOUNTER — Encounter (HOSPITAL_BASED_OUTPATIENT_CLINIC_OR_DEPARTMENT_OTHER)
Admission: RE | Disposition: A | Discharge status: Home / Self Care | Payer: Self-pay | Source: Home / Self Care | Attending: General Surgery

## 2021-12-06 ENCOUNTER — Ambulatory Visit (HOSPITAL_BASED_OUTPATIENT_CLINIC_OR_DEPARTMENT_OTHER): Payer: Managed Care, Other (non HMO) | Admitting: Anesthesiology

## 2021-12-06 DIAGNOSIS — C50412 Malignant neoplasm of upper-outer quadrant of left female breast: Secondary | ICD-10-CM | POA: Diagnosis present

## 2021-12-06 DIAGNOSIS — Z8052 Family history of malignant neoplasm of bladder: Secondary | ICD-10-CM | POA: Diagnosis not present

## 2021-12-06 DIAGNOSIS — Z01818 Encounter for other preprocedural examination: Secondary | ICD-10-CM

## 2021-12-06 DIAGNOSIS — Z7984 Long term (current) use of oral hypoglycemic drugs: Secondary | ICD-10-CM | POA: Diagnosis not present

## 2021-12-06 DIAGNOSIS — Z170 Estrogen receptor positive status [ER+]: Secondary | ICD-10-CM | POA: Diagnosis not present

## 2021-12-06 DIAGNOSIS — I10 Essential (primary) hypertension: Secondary | ICD-10-CM | POA: Diagnosis not present

## 2021-12-06 DIAGNOSIS — E119 Type 2 diabetes mellitus without complications: Secondary | ICD-10-CM | POA: Insufficient documentation

## 2021-12-06 DIAGNOSIS — Z803 Family history of malignant neoplasm of breast: Secondary | ICD-10-CM | POA: Insufficient documentation

## 2021-12-06 HISTORY — PX: BREAST LUMPECTOMY WITH RADIOACTIVE SEED AND SENTINEL LYMPH NODE BIOPSY: SHX6550

## 2021-12-06 LAB — GLUCOSE, CAPILLARY
Glucose-Capillary: 135 mg/dL — ABNORMAL HIGH (ref 70–99)
Glucose-Capillary: 156 mg/dL — ABNORMAL HIGH (ref 70–99)

## 2021-12-06 SURGERY — BREAST LUMPECTOMY WITH RADIOACTIVE SEED AND SENTINEL LYMPH NODE BIOPSY
Anesthesia: General | Site: Breast | Laterality: Left

## 2021-12-06 MED ORDER — FENTANYL CITRATE (PF) 100 MCG/2ML IJ SOLN
INTRAMUSCULAR | Status: AC
  Filled 2021-12-06: qty 2

## 2021-12-06 MED ORDER — MIDAZOLAM HCL 2 MG/2ML IJ SOLN
INTRAMUSCULAR | Status: AC
  Filled 2021-12-06: qty 2

## 2021-12-06 MED ORDER — LACTATED RINGERS IV SOLN: INTRAVENOUS | Status: DC

## 2021-12-06 MED ORDER — EPHEDRINE SULFATE (PRESSORS) 50 MG/ML IJ SOLN
INTRAMUSCULAR | Status: DC | PRN
  Administered 2021-12-06 (×2): 10 mg via INTRAVENOUS

## 2021-12-06 MED ORDER — LIDOCAINE-EPINEPHRINE (PF) 1 %-1:200000 IJ SOLN
INTRAMUSCULAR | Status: DC | PRN
  Administered 2021-12-06: 20 mL via INTRAMUSCULAR

## 2021-12-06 MED ORDER — OXYCODONE HCL 5 MG PO TABS: 5.0000 mg | ORAL_TABLET | Freq: Once | ORAL | Status: DC | PRN

## 2021-12-06 MED ORDER — OXYCODONE HCL 5 MG PO TABS: 5.0000 mg | ORAL_TABLET | Freq: Four times a day (QID) | ORAL | 0 refills | Status: DC | PRN

## 2021-12-06 MED ORDER — CHLORHEXIDINE GLUCONATE CLOTH 2 % EX PADS: 6.0000 | MEDICATED_PAD | Freq: Once | CUTANEOUS | Status: DC

## 2021-12-06 MED ORDER — CEFAZOLIN SODIUM-DEXTROSE 2-4 GM/100ML-% IV SOLN
INTRAVENOUS | Status: AC
  Filled 2021-12-06: qty 100

## 2021-12-06 MED ORDER — ONDANSETRON HCL 4 MG/2ML IJ SOLN
INTRAMUSCULAR | Status: AC
  Filled 2021-12-06: qty 2

## 2021-12-06 MED ORDER — BUPIVACAINE LIPOSOME 1.3 % IJ SUSP
INTRAMUSCULAR | Status: DC | PRN
  Administered 2021-12-06: 10 mL via PERINEURAL

## 2021-12-06 MED ORDER — PROPOFOL 10 MG/ML IV BOLUS
INTRAVENOUS | Status: DC | PRN
  Administered 2021-12-06: 120 mg via INTRAVENOUS

## 2021-12-06 MED ORDER — LIDOCAINE 2% (20 MG/ML) 5 ML SYRINGE
INTRAMUSCULAR | Status: AC
  Filled 2021-12-06: qty 5

## 2021-12-06 MED ORDER — AMISULPRIDE (ANTIEMETIC) 5 MG/2ML IV SOLN: 10.0000 mg | Freq: Once | INTRAVENOUS | Status: DC | PRN

## 2021-12-06 MED ORDER — DEXAMETHASONE SODIUM PHOSPHATE 10 MG/ML IJ SOLN
INTRAMUSCULAR | Status: DC | PRN
  Administered 2021-12-06: 5 mg via INTRAVENOUS

## 2021-12-06 MED ORDER — FENTANYL CITRATE (PF) 100 MCG/2ML IJ SOLN: 25.0000 ug | INTRAMUSCULAR | Status: DC | PRN

## 2021-12-06 MED ORDER — ACETAMINOPHEN 500 MG PO TABS
1000.0000 mg | ORAL_TABLET | Freq: Once | ORAL | Status: AC
  Administered 2021-12-06: 1000 mg via ORAL

## 2021-12-06 MED ORDER — LIDOCAINE 2% (20 MG/ML) 5 ML SYRINGE
INTRAMUSCULAR | Status: DC | PRN
  Administered 2021-12-06: 60 mg via INTRAVENOUS

## 2021-12-06 MED ORDER — PROPOFOL 10 MG/ML IV BOLUS
INTRAVENOUS | Status: AC
  Filled 2021-12-06: qty 20

## 2021-12-06 MED ORDER — DEXAMETHASONE SODIUM PHOSPHATE 10 MG/ML IJ SOLN
INTRAMUSCULAR | Status: AC
  Filled 2021-12-06: qty 1

## 2021-12-06 MED ORDER — FENTANYL CITRATE (PF) 100 MCG/2ML IJ SOLN
INTRAMUSCULAR | Status: DC | PRN
  Administered 2021-12-06 (×2): 25 ug via INTRAVENOUS
  Administered 2021-12-06: 50 ug via INTRAVENOUS

## 2021-12-06 MED ORDER — OXYCODONE HCL 5 MG/5ML PO SOLN: 5.0000 mg | Freq: Once | ORAL | Status: DC | PRN

## 2021-12-06 MED ORDER — CEFAZOLIN SODIUM-DEXTROSE 2-4 GM/100ML-% IV SOLN
2.0000 g | INTRAVENOUS | Status: AC
  Administered 2021-12-06: 2 g via INTRAVENOUS

## 2021-12-06 MED ORDER — BUPIVACAINE-EPINEPHRINE (PF) 0.5% -1:200000 IJ SOLN
INTRAMUSCULAR | Status: DC | PRN
  Administered 2021-12-06: 20 mL via PERINEURAL

## 2021-12-06 MED ORDER — ACETAMINOPHEN 500 MG PO TABS: 1000.0000 mg | ORAL_TABLET | ORAL | Status: DC

## 2021-12-06 MED ORDER — MAGTRACE LYMPHATIC TRACER
INTRAMUSCULAR | Status: DC | PRN
  Administered 2021-12-06: 2 mL via INTRAMUSCULAR

## 2021-12-06 MED ORDER — ACETAMINOPHEN 500 MG PO TABS
ORAL_TABLET | ORAL | Status: AC
  Filled 2021-12-06: qty 2

## 2021-12-06 MED ORDER — FENTANYL CITRATE (PF) 100 MCG/2ML IJ SOLN
100.0000 ug | Freq: Once | INTRAMUSCULAR | Status: AC
  Administered 2021-12-06: 50 ug via INTRAVENOUS

## 2021-12-06 MED ORDER — MIDAZOLAM HCL 5 MG/5ML IJ SOLN
INTRAMUSCULAR | Status: DC | PRN
  Administered 2021-12-06: 2 mg via INTRAVENOUS

## 2021-12-06 MED ORDER — ONDANSETRON HCL 4 MG/2ML IJ SOLN
INTRAMUSCULAR | Status: DC | PRN
  Administered 2021-12-06: 4 mg via INTRAVENOUS

## 2021-12-06 MED ORDER — MIDAZOLAM HCL 2 MG/2ML IJ SOLN
2.0000 mg | Freq: Once | INTRAMUSCULAR | Status: AC
  Administered 2021-12-06: 1 mg via INTRAVENOUS

## 2021-12-06 SURGICAL SUPPLY — 55 items
ADH SKN CLS APL DERMABOND .7 (GAUZE/BANDAGES/DRESSINGS) ×1
APL PRP STRL LF DISP 70% ISPRP (MISCELLANEOUS) ×1
BINDER BREAST LRG (GAUZE/BANDAGES/DRESSINGS) IMPLANT
BINDER BREAST MEDIUM (GAUZE/BANDAGES/DRESSINGS) IMPLANT
BLADE SURG 10 STRL SS (BLADE) ×1 IMPLANT
BLADE SURG 15 STRL LF DISP TIS (BLADE) ×1 IMPLANT
BLADE SURG 15 STRL SS (BLADE) ×1
BNDG CMPR 5X4 CHSV STRCH STRL (GAUZE/BANDAGES/DRESSINGS) ×1
BNDG COHESIVE 4X5 TAN STRL LF (GAUZE/BANDAGES/DRESSINGS) ×1 IMPLANT
CANISTER SUCT 1200ML W/VALVE (MISCELLANEOUS) ×1 IMPLANT
CHLORAPREP W/TINT 26 (MISCELLANEOUS) ×1 IMPLANT
CLIP TI LARGE 6 (CLIP) ×1 IMPLANT
CLIP TI MEDIUM 6 (CLIP) ×2 IMPLANT
COVER MAYO STAND STRL (DRAPES) ×2 IMPLANT
COVER PROBE W GEL 5X96 (DRAPES) ×1 IMPLANT
DERMABOND ADVANCED .7 DNX12 (GAUZE/BANDAGES/DRESSINGS) ×1 IMPLANT
DRAPE UTILITY XL STRL (DRAPES) ×1 IMPLANT
ELECT COATED BLADE 2.86 ST (ELECTRODE) ×1 IMPLANT
ELECT REM PT RETURN 9FT ADLT (ELECTROSURGICAL) ×1
ELECTRODE REM PT RTRN 9FT ADLT (ELECTROSURGICAL) ×1 IMPLANT
GAUZE PAD ABD 8X10 STRL (GAUZE/BANDAGES/DRESSINGS) ×1 IMPLANT
GAUZE SPONGE 4X4 12PLY STRL LF (GAUZE/BANDAGES/DRESSINGS) ×1 IMPLANT
GLOVE BIO SURGEON STRL SZ 6 (GLOVE) ×1 IMPLANT
GLOVE BIO SURGEON STRL SZ 6.5 (GLOVE) IMPLANT
GLOVE BIOGEL PI IND STRL 6.5 (GLOVE) ×1 IMPLANT
GOWN STRL REUS W/ TWL LRG LVL3 (GOWN DISPOSABLE) ×1 IMPLANT
GOWN STRL REUS W/TWL 2XL LVL3 (GOWN DISPOSABLE) ×1 IMPLANT
GOWN STRL REUS W/TWL LRG LVL3 (GOWN DISPOSABLE) ×1
KIT MARKER MARGIN INK (KITS) ×1 IMPLANT
LIGHT WAVEGUIDE WIDE FLAT (MISCELLANEOUS) IMPLANT
NDL HYPO 25X1 1.5 SAFETY (NEEDLE) ×1 IMPLANT
NDL SAFETY ECLIP 18X1.5 (MISCELLANEOUS) ×1 IMPLANT
NEEDLE HYPO 25X1 1.5 SAFETY (NEEDLE) ×1 IMPLANT
NS IRRIG 1000ML POUR BTL (IV SOLUTION) ×1 IMPLANT
PACK BASIN DAY SURGERY FS (CUSTOM PROCEDURE TRAY) ×1 IMPLANT
PACK UNIVERSAL I (CUSTOM PROCEDURE TRAY) ×1 IMPLANT
PENCIL SMOKE EVACUATOR (MISCELLANEOUS) ×1 IMPLANT
SLEEVE SCD COMPRESS KNEE MED (STOCKING) ×1 IMPLANT
SPONGE T-LAP 18X18 ~~LOC~~+RFID (SPONGE) ×2 IMPLANT
STOCKINETTE IMPERVIOUS LG (DRAPES) ×1 IMPLANT
STRIP CLOSURE SKIN 1/2X4 (GAUZE/BANDAGES/DRESSINGS) ×1 IMPLANT
SUT MNCRL AB 4-0 PS2 18 (SUTURE) ×1 IMPLANT
SUT MON AB 5-0 PS2 18 (SUTURE) IMPLANT
SUT VIC AB 2-0 SH 27 (SUTURE) ×1
SUT VIC AB 2-0 SH 27XBRD (SUTURE) ×1 IMPLANT
SUT VIC AB 3-0 SH 27 (SUTURE)
SUT VIC AB 3-0 SH 27X BRD (SUTURE) ×1 IMPLANT
SUT VICRYL 3-0 CR8 SH (SUTURE) ×1 IMPLANT
SYR BULB EAR ULCER 3OZ GRN STR (SYRINGE) ×1 IMPLANT
SYR CONTROL 10ML LL (SYRINGE) ×1 IMPLANT
TOWEL GREEN STERILE FF (TOWEL DISPOSABLE) ×1 IMPLANT
TRACER MAGTRACE VIAL (MISCELLANEOUS) IMPLANT
TRAY FAXITRON CT DISP (TRAY / TRAY PROCEDURE) ×1 IMPLANT
TUBE CONNECTING 20X1/4 (TUBING) ×1 IMPLANT
YANKAUER SUCT BULB TIP NO VENT (SUCTIONS) ×1 IMPLANT

## 2021-12-06 NOTE — Progress Notes (Signed)
Assisted Dr. Daiva Huge with left, pectoralis, ultrasound guided block. Side rails up, monitors on throughout procedure. See vital signs in flow sheet. Tolerated Procedure well.

## 2021-12-06 NOTE — Interval H&P Note (Signed)
History and Physical Interval Note:  12/06/2021 11:49 AM  Kari Torres  has presented today for surgery, with the diagnosis of LEFT BREAST CANCER.  The various methods of treatment have been discussed with the patient and family. After consideration of risks, benefits and other options for treatment, the patient has consented to  Procedure(s): LEFT BREAST LUMPECTOMY WITH RADIOACTIVE SEED AND SENTINEL LYMPH NODE BIOPSY (Left) as a surgical intervention.  The patient's history has been reviewed, patient examined, no change in status, stable for surgery.  I have reviewed the patient's chart and labs.  Questions were answered to the patient's satisfaction.     Stark Klein

## 2021-12-06 NOTE — Op Note (Signed)
Left Breast Radioactive seed localized lumpectomy and sentinel lymph node biopsy  Indications: This patient presents with history of left breast cancer, upper outer quadrant, cT1N0, +/+/-, Ki 67 5%  Pre-operative Diagnosis: left breast cancer  Post-operative Diagnosis: Same  Surgeon: Stark Klein   Anesthesia: General endotracheal anesthesia  ASA Class: 2  Procedure Details  The patient was seen in the Holding Room. The risks, benefits, complications, treatment options, and expected outcomes were discussed with the patient. The possibilities of bleeding, infection, the need for additional procedures, failure to diagnose a condition, and creating a complication requiring transfusion or operation were discussed with the patient. The patient concurred with the proposed plan, giving informed consent.  The site of surgery properly noted/marked. The patient was taken to Operating Room # 8, identified, and the procedure verified as Left Breast Seed localized Lumpectomy with sentinel lymph node biopsy. A Time Out was held and the above information confirmed.  The left arm, breast, and chest were prepped and draped in standard fashion. The lumpectomy was performed by creating a lateral circumareolar incision near the previously placed radioactive seed.  Dissection was carried down to around the point of maximum signal intensity. The cautery was used to perform the dissection.  Hemostasis was achieved with cautery. The edges of the cavity were marked with large clips, with one each medial, lateral, inferior and superior, and two clips posteriorly.   The specimen was inked with the margin marker paint kit.    Specimen radiography confirmed inclusion of the mammographic lesion, the clip, and the seed.  The margins were close, so additional margins were taken at all the directions other than posteriorly which was pectoralis.  The background signal in the breast was zero.  The wound was irrigated and closed with  3-0 vicryl in layers and 4-0 monocryl subcuticular suture.    Using a sentimag probe, left axillary sentinel nodes were identified transcutaneously.  An oblique incision was created below the axillary hairline.  Dissection was carried through the clavipectoral fascia.  Two deep level two axillary sentinel nodes were removed.  Counts per second were 3500 and 2900.    Lymphovascular channels were clipped with hemoclips.  The background count was 50 cps.  There was an additional palpable node that was taken.  The wound was irrigated.  Hemostasis was achieved with cautery.  The axillary incision was closed with a 3-0 vicryl deep dermal interrupted sutures and a 4-0 monocryl subcuticular closure.    Sterile dressings were applied. At the end of the operation, all sponge, instrument, and needle counts were correct.  Findings: grossly clear surgical margins and no adenopathy, posterior margin is pectoralis.  Anterior and lateral margins are skin  Estimated Blood Loss:  min         Specimens: left breast tissue with seed , superior margin, medial margin, inferior margin, lateral margin, anterior margin, SLN #1, #2, and #3.              Complications:  None; patient tolerated the procedure well.         Disposition: PACU - hemodynamically stable.         Condition: stable

## 2021-12-06 NOTE — Anesthesia Procedure Notes (Signed)
Procedure Name: LMA Insertion Date/Time: 12/06/2021 1:27 PM  Performed by: Verita Lamb, CRNAPre-anesthesia Checklist: Patient identified, Emergency Drugs available, Suction available and Patient being monitored Patient Re-evaluated:Patient Re-evaluated prior to induction Oxygen Delivery Method: Circle system utilized Preoxygenation: Pre-oxygenation with 100% oxygen Induction Type: IV induction Ventilation: Mask ventilation without difficulty LMA: LMA inserted LMA Size: 4.0 Number of attempts: 1 Airway Equipment and Method: Bite block Placement Confirmation: positive ETCO2, CO2 detector and breath sounds checked- equal and bilateral Tube secured with: Tape Dental Injury: Teeth and Oropharynx as per pre-operative assessment

## 2021-12-06 NOTE — Anesthesia Postprocedure Evaluation (Signed)
Anesthesia Post Note  Patient: Kari Torres  Procedure(s) Performed: LEFT BREAST LUMPECTOMY WITH RADIOACTIVE SEED AND SENTINEL LYMPH NODE BIOPSY (Left: Breast)     Patient location during evaluation: PACU Anesthesia Type: General Level of consciousness: awake and alert Pain management: pain level controlled Vital Signs Assessment: post-procedure vital signs reviewed and stable Respiratory status: spontaneous breathing, nonlabored ventilation and respiratory function stable Cardiovascular status: blood pressure returned to baseline Postop Assessment: no apparent nausea or vomiting Anesthetic complications: no   No notable events documented.  Last Vitals:  Vitals:   12/06/21 1537 12/06/21 1545  BP: 135/85 139/79  Pulse: 72 71  Resp: 14 16  Temp:  36.6 C  SpO2: 95% 96%    Last Pain:  Vitals:   12/06/21 1545  TempSrc:   PainSc: 0-No pain                 Marthenia Rolling

## 2021-12-06 NOTE — Discharge Instructions (Addendum)
[place] Office Phone Number [phone]  BREAST BIOPSY/ PARTIAL MASTECTOMY: POST OP INSTRUCTIONS  Always review your discharge instruction sheet given to you by the facility where your surgery was performed.  IF YOU HAVE DISABILITY OR FAMILY LEAVE FORMS, YOU MUST BRING THEM TO THE OFFICE FOR PROCESSING.  DO NOT GIVE THEM TO YOUR DOCTOR.  Take 2 tylenol (acetominophen) three times a day for 3 days.  If you still have pain, add ibuprofen with food in between if able to take this (if you have kidney issues or stomach issues, do not take ibuprofen).  If both of those are not enough, add the narcotic pain pill.  If you find you are needing a lot of this overnight after surgery, call the next morning for a refill.    Prescriptions will not be filled after 5pm or on week-ends. Take your usually prescribed medications unless otherwise directed You should eat very light the first 24 hours after surgery, such as soup, crackers, pudding, etc.  Resume your normal diet the day after surgery. Most patients will experience some swelling and bruising in the breast.  Ice packs and a good support bra will help.  Swelling and bruising can take several days to resolve.  It is common to experience some constipation if taking pain medication after surgery.  Increasing fluid intake and taking a stool softener will usually help or prevent this problem from occurring.  A mild laxative (Milk of Magnesia or Miralax) should be taken according to package directions if there are no bowel movements after 48 hours. Unless discharge instructions indicate otherwise, you may remove your bandages 48 hours after surgery, and you may shower at that time.  You may have steri-strips (small skin tapes) in place directly over the incision.  These strips should be left on the skin at least for for 7-10 days.    ACTIVITIES:  You may resume regular daily activities (gradually increasing) beginning the next day.  Wearing a  good support bra or sports bra (or the breast binder) minimizes pain and swelling.  You may have sexual intercourse when it is comfortable. No heavy lifting for 1-2 weeks (not over around 10 pounds).  You may drive when you no longer are taking prescription pain medication, you can comfortably wear a seatbelt, and you can safely maneuver your car and apply brakes. RETURN TO WORK:  __________3-14 days depending on job. _______________ Dennis Bast should see your doctor in the office for a follow-up appointment approximately two weeks after your surgery.  Your doctor's nurse will typically make your follow-up appointment when she calls you with your pathology report.  Expect your pathology report 3-4 business days after your surgery.  You may call to check if you do not hear from [place] after three days.   WHEN TO CALL YOUR DOCTOR: Fever over 101.0 Nausea and/or vomiting. Extreme swelling or bruising. Continued bleeding from incision. Increased pain, redness, or drainage from the incision.  The clinic staff is available to answer your questions during regular business hours.  Please don't hesitate to call and ask to speak to one of the nurses for clinical concerns.  If you have a medical emergency, go to the nearest emergency room or call 911.  A surgeon from [facility] is always on call at the hospital.  For further questions, please visit [email or web address]     Post Anesthesia Home Care Instructions  Activity: Get plenty of rest for the remainder of the day. A responsible individual must  stay with you for 24 hours following the procedure.  For the next 24 hours, DO NOT: -Drive a car -Paediatric nurse -Drink alcoholic beverages -Take any medication unless instructed by your physician -Make any legal decisions or sign important papers.  Meals: Start with liquid foods such as gelatin or soup. Progress to regular foods as tolerated. Avoid greasy, spicy, heavy foods. If nausea  and/or vomiting occur, drink only clear liquids until the nausea and/or vomiting subsides. Call your physician if vomiting continues.  Special Instructions/Symptoms: Your throat may feel dry or sore from the anesthesia or the breathing tube placed in your throat during surgery. If this causes discomfort, gargle with warm salt water. The discomfort should disappear within 24 hours.  If you had a scopolamine patch placed behind your ear for the management of post- operative nausea and/or vomiting:  1. The medication in the patch is effective for 72 hours, after which it should be removed.  Wrap patch in a tissue and discard in the trash. Wash hands thoroughly with soap and water. 2. You may remove the patch earlier than 72 hours if you experience unpleasant side effects which may include dry mouth, dizziness or visual disturbances. 3. Avoid touching the patch. Wash your hands with soap and water after contact with the patch.      Information for Discharge Teaching: EXPAREL (bupivacaine liposome injectable suspension)   Your surgeon or anesthesiologist gave you EXPAREL(bupivacaine) to help control your pain after surgery.  EXPAREL is a local anesthetic that provides pain relief by numbing the tissue around the surgical site. EXPAREL is designed to release pain medication over time and can control pain for up to 72 hours. Depending on how you respond to EXPAREL, you may require less pain medication during your recovery.  Possible side effects: Temporary loss of sensation or ability to move in the area where bupivacaine was injected. Nausea, vomiting, constipation Rarely, numbness and tingling in your mouth or lips, lightheadedness, or anxiety may occur. Call your doctor right away if you think you may be experiencing any of these sensations, or if you have other questions regarding possible side effects.  Follow all other discharge instructions given to you by your surgeon or nurse. Eat a  healthy diet and drink plenty of water or other fluids.  If you return to the hospital for any reason within 96 hours following the administration of EXPAREL, it is important for health care providers to know that you have received this anesthetic. A teal colored band has been placed on your arm with the date, time and amount of EXPAREL you have received in order to alert and inform your health care providers. Please leave this armband in place for the full 96 hours following administration, and then you may remove the band.     Next dose of Tylenol may be taken at 5:30 PM.

## 2021-12-06 NOTE — Transfer of Care (Signed)
Immediate Anesthesia Transfer of Care Note  Patient: Kari Torres  Procedure(s) Performed: LEFT BREAST LUMPECTOMY WITH RADIOACTIVE SEED AND SENTINEL LYMPH NODE BIOPSY (Left: Breast)  Patient Location: PACU  Anesthesia Type:General  Level of Consciousness: drowsy and patient cooperative  Airway & Oxygen Therapy: Patient Spontanous Breathing and Patient connected to face mask oxygen  Post-op Assessment: Report given to RN and Post -op Vital signs reviewed and stable  Post vital signs: Reviewed and stable  Last Vitals:  Vitals Value Taken Time  BP    Temp    Pulse 77 12/06/21 1512  Resp 16 12/06/21 1512  SpO2 99 % 12/06/21 1512  Vitals shown include unvalidated device data.  Last Pain:  Vitals:   12/06/21 1127  TempSrc: Oral  PainSc: 0-No pain      Patients Stated Pain Goal: 7 (03/49/61 1643)  Complications: No notable events documented.

## 2021-12-06 NOTE — Anesthesia Preprocedure Evaluation (Addendum)
Anesthesia Evaluation  Patient identified by MRN, date of birth, ID band Patient awake    Reviewed: Allergy & Precautions, NPO status , Patient's Chart, lab work & pertinent test results, reviewed documented beta blocker date and time   History of Anesthesia Complications Negative for: history of anesthetic complications  Airway Mallampati: II  TM Distance: >3 FB Neck ROM: Full    Dental no notable dental hx.    Pulmonary neg pulmonary ROS,    Pulmonary exam normal        Cardiovascular hypertension, Pt. on home beta blockers and Pt. on medications Normal cardiovascular exam     Neuro/Psych negative neurological ROS  negative psych ROS   GI/Hepatic negative GI ROS, Neg liver ROS,   Endo/Other  diabetes, Type 2, Oral Hypoglycemic Agents  Renal/GU negative Renal ROS  negative genitourinary   Musculoskeletal negative musculoskeletal ROS (+)   Abdominal   Peds  Hematology  (+) REFUSES BLOOD PRODUCTS,   Anesthesia Other Findings LEFT BREAST CANCER  Reproductive/Obstetrics negative OB ROS                            Anesthesia Physical Anesthesia Plan  ASA: 2  Anesthesia Plan: General   Post-op Pain Management: Regional block* and Tylenol PO (pre-op)*   Induction: Intravenous  PONV Risk Score and Plan: 3 and Treatment may vary due to age or medical condition, Midazolam, Dexamethasone and Ondansetron  Airway Management Planned: LMA  Additional Equipment: None  Intra-op Plan:   Post-operative Plan: Extubation in OR  Informed Consent: I have reviewed the patients History and Physical, chart, labs and discussed the procedure including the risks, benefits and alternatives for the proposed anesthesia with the patient or authorized representative who has indicated his/her understanding and acceptance.     Dental advisory given  Plan Discussed with: CRNA  Anesthesia Plan Comments:         Anesthesia Quick Evaluation

## 2021-12-06 NOTE — Anesthesia Procedure Notes (Signed)
Anesthesia Regional Block: Pectoralis block   Pre-Anesthetic Checklist: , timeout performed,  Correct Patient, Correct Site, Correct Laterality,  Correct Procedure, Correct Position, site marked,  Risks and benefits discussed,  Pre-op evaluation,  At surgeon's request and post-op pain management  Laterality: Left  Prep: Maximum Sterile Barrier Precautions used, chloraprep       Needles:  Injection technique: Single-shot  Needle Type: Echogenic Stimulator Needle     Needle Length: 9cm  Needle Gauge: 22     Additional Needles:   Procedures:,,,, ultrasound used (permanent image in chart),,    Narrative:  Start time: 12/06/2021 12:36 PM End time: 12/06/2021 12:39 PM Injection made incrementally with aspirations every 5 mL.  Performed by: Personally  Anesthesiologist: Brennan Bailey, MD  Additional Notes: Risks, benefits, and alternative discussed. Patient gave consent for procedure. Patient prepped and draped in sterile fashion. Sedation administered, patient remains easily responsive to voice. Relevant anatomy identified with ultrasound guidance. Local anesthetic given in 5cc increments with no signs or symptoms of intravascular injection. No pain or paraesthesias with injection. Patient monitored throughout procedure with signs of LAST or immediate complications. Tolerated well. Ultrasound image placed in chart.  Tawny Asal, MD

## 2021-12-07 ENCOUNTER — Encounter (HOSPITAL_BASED_OUTPATIENT_CLINIC_OR_DEPARTMENT_OTHER): Payer: Self-pay | Admitting: General Surgery

## 2021-12-07 NOTE — Addendum Note (Signed)
Addendum  created 12/07/21 [id number] by Leaman Abe, Ernesta Amble, CRNA   Charge Capture section accepted

## 2021-12-10 ENCOUNTER — Encounter: Payer: Self-pay | Admitting: Genetic Counselor

## 2021-12-10 ENCOUNTER — Ambulatory Visit: Payer: Self-pay | Admitting: Genetic Counselor

## 2021-12-10 ENCOUNTER — Other Ambulatory Visit: Payer: Self-pay

## 2021-12-10 DIAGNOSIS — C50412 Malignant neoplasm of upper-outer quadrant of left female breast: Secondary | ICD-10-CM

## 2021-12-10 DIAGNOSIS — Z808 Family history of malignant neoplasm of other organs or systems: Secondary | ICD-10-CM

## 2021-12-10 DIAGNOSIS — Z1379 Encounter for other screening for genetic and chromosomal anomalies: Secondary | ICD-10-CM

## 2021-12-10 DIAGNOSIS — Z8052 Family history of malignant neoplasm of bladder: Secondary | ICD-10-CM

## 2021-12-10 DIAGNOSIS — Z1501 Genetic susceptibility to malignant neoplasm of breast: Secondary | ICD-10-CM

## 2021-12-10 DIAGNOSIS — Z803 Family history of malignant neoplasm of breast: Secondary | ICD-10-CM

## 2021-12-10 NOTE — Progress Notes (Signed)
GENETIC TEST RESULTS   Patient Name: Kari Torres. Kari Torres Patient Age: 56 YOF Encounter Date: 11/30/2021   Referring Provider: Rachel Moulds, MD   Kari Torres was initially seen in the Cancer Genetics clinic on November 21, 2021 due to personal and family breast cancer and concern regarding a hereditary predisposition to cancer in the family.     CANCER HISTORY:  In August 2023, at the age of 53, Kari Torres was diagnosed with invasive ductal carcinoma of the left breast (ER+/PR+/HER2-) s/p lumpectomy on 12/06/2021.   Oncology History  Malignant neoplasm of upper-outer quadrant of left breast in female, estrogen receptor positive (HCC)  11/06/2021 Mammogram   Bilateral screening mammogram shows left breast asymmetry which is indeterminate.  Additional views with possible ultrasound are recommended.  Left breast unilateral diagnostic mammogram confirmed a 1.6 x 1.1 cm upper outer aspect middle depth asymmetry with no other significant masses or calcifications.   11/12/2021 Pathology Results   Left breast needle core biopsy at 2:00 shows invasive ductal carcinoma, grade 2 prognostics ER 100% positive strong staining PR 50% positive moderate to strong staining Ki-67 of 5% and group 4 HER2 negative   11/19/2021 Initial Diagnosis   Malignant neoplasm of upper-outer quadrant of left breast in female, estrogen receptor positive (HCC)   11/20/2021 Cancer Staging   Staging form: Breast, AJCC 8th Edition - Clinical stage from 11/20/2021: Stage IA (cT1c, cN0, cM0, G2, ER+, PR+, HER2-) - Signed by Kari Bacon, PA-C on 11/20/2021 Method of lymph node assessment: Clinical Histologic grading system: 3 grade system   11/30/2021 Genetic Testing   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  [facility] report date is 11/30/2021.  [facility] CancerNext-Expanded +RNAinsight report date is 12/06/2021.   The CancerNext-Expanded gene panel offered by [facility] and includes sequencing,  rearrangement, and RNA analysis for the following 77 genes: AIP, ALK, APC, ATM, AXIN2, BAP1, BARD1, BLM, BMPR1A, BRCA1, BRCA2, BRIP1, CDC73, CDH1, CDK4, CDKN1B, CDKN2A, CHEK2, CTNNA1, DICER1, FANCC, FH, FLCN, GALNT12, KIF1B, LZTR1, MAX, MEN1, MET, MLH1, MSH2, MSH3, MSH6, MUTYH, NBN, NF1, NF2, NTHL1, PALB2, PHOX2B, PMS2, POT1, PRKAR1A, PTCH1, PTEN, RAD51C, RAD51D, RB1, RECQL, RET, SDHA, SDHAF2, SDHB, SDHC, SDHD, SMAD4, SMARCA4, SMARCB1, SMARCE1, STK11, SUFU, TMEM127, TP53, TSC1, TSC2, VHL and XRCC2 (sequencing and deletion/duplication); EGFR, EGLN1, HOXB13, KIT, MITF, PDGFRA, POLD1, and POLE (sequencing only); EPCAM and GREM1 (deletion/duplication only).        FAMILY HISTORY:  We obtained a detailed, 4-generation family history.  Significant diagnoses are listed below:      Family History  Problem Relation Age of Onset   Breast cancer Mother 80        met d. 45   Brain cancer Maternal Aunt          dx 65s; d. 40s   Cancer Maternal Uncle          unknown type; ? lung;  dx 52s   Breast cancer Other          MGM's sister with breast cancer or other primary   Brain cancer Cousin          mat female cousin; dx 73s; d. 26s   Bladder Cancer Half-Brother          dx 34s; paternal half brother           Kari Torres is unaware of previous family history of genetic testing for hereditary cancer risks. There is no reported Ashkenazi Jewish ancestry. There is no known consanguinity.  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 There were no deleterious mutations in other genes tested.    The CancerNext-Expanded gene panel offered by [facility] and includes sequencing, rearrangement, and RNA analysis for the following 77 genes: AIP, ALK, APC, ATM, AXIN2, BAP1, BARD1, BLM, BMPR1A, BRCA1, BRCA2, BRIP1, CDC73, CDH1, CDK4, CDKN1B, CDKN2A, CHEK2, CTNNA1, DICER1, FANCC, FH,  FLCN, GALNT12, KIF1B, LZTR1, MAX, MEN1, MET, MLH1, MSH2, MSH3, MSH6, MUTYH, NBN, NF1, NF2, NTHL1, PALB2, PHOX2B, PMS2, POT1, PRKAR1A, PTCH1, PTEN, RAD51C, RAD51D, RB1, RECQL, RET, SDHA, SDHAF2, SDHB, SDHC, SDHD, SMAD4, SMARCA4, SMARCB1, SMARCE1, STK11, SUFU, TMEM127, TP53, TSC1, TSC2, VHL and XRCC2 (sequencing and deletion/duplication); EGFR, EGLN1, HOXB13, KIT, MITF, PDGFRA, POLD1, and POLE (sequencing only); EPCAM and GREM1 (deletion/duplication only).    The test report has been scanned into EPIC and is located under the Molecular Pathology section of the Results Review tab.  A portion of the result report is included below for reference.        Kari Torres  was notified of these results, and we discussed the following:     Clinical Information: The cancers associated with PALB2 are:   Female breast cancer, 41-60% risk Data suggests 15-year contralateral breast cancer risk for premenopausal women with ER negative breast cancer to be greater than 20% (PMID: 38937342) Ovarian cancer, 3-5% risk Pancreatic cancer, 2-5% risk Female breast cancer, 0.9% risk by age 2     Management Recommendations:   Breast Screening/Risk Reduction:   Women: Breast cancer screening includes: Breast awareness beginning at age 19 Monthly self-breast examination beginning at age 44 Annual clinical breast examinations beginning at age 39 Annual mammogram beginning at age 66  Annual breast MRI with contrast beginning at age 60 Discuss the option of a prophylactic risk-reducing mastectomy   Males: Consider breast cancer screening such as patient breast awareness education and clinical breast exams   Gynecological Cancer Screening/Risk Reduction: Consider a prophylactic bilateral salpingo-oophorectomy (BSO) after age 64   Pancreatic Cancer Screening/Risk Reduction: Avoid smoking, heavy alcohol use, and obesity. Pancreatic cancer screening in patients with a first- or second-degree relative affected with  pancreatic cancer. Ideally, screening should be performed in experienced centers utilizing a multidisciplinary approach under research conditions. Screening includes annual endoscopic ultrasound and/or MRI of the pancreas starting at age 75 or 28 years younger than the earliest age of pancreatic cancer diagnosis in the family.   Additional Considerations: Children who inherit two PALB2 mutations (one from each parent) have a rare form of Fanconi anemia. Fanconi anemia is characterized by bone marrow failure with variable additional anomalies, which often include short stature, abnormal skin pigmentation, abnormal thumbs, malformations of the skeletal and central nervous systems, and developmental delay. Genetic testing for a partner of an individual with a PALB2 mutation may be appropriate for reproductive decision making. Patients of reproductive age should be made aware of options for prenatal diagnosis and assisted reproduction including pre-implantation genetic diagnosis.   This information is based on current understanding of the gene and may change in the future.   Implications for Family Members: Hereditary predisposition to cancer due to pathogenic variants in the PALB2 gene has autosomal dominant inheritance. This means that an individual with a pathogenic variant has a 50% chance of passing the condition on to his/her offspring.  Her half siblings each have a 25% chance of having this family mutation. Identification of a pathogenic variant allows for the recognition of at-risk  relatives who can pursue testing for the familial variant.   Family members are encouraged to consider genetic testing for this familial pathogenic variant. As there are generally no childhood cancer risks associated with single pathogenic variants in the PALB2 gene, individuals in the family are not recommended to have testing until they reach at least 56 years of age. They may contact our office at [phone] for more  information or to schedule an appointment.  Complimentary testing for the familial variant is available for 90 days from her report date (December 06, 2021).  Family members who live outside of the area are encouraged to find a genetic counselor in their area by visiting: [email or web address].   Resources: FORCE (Facing Our Risk of Cancer Empowered) is a resource for those with a hereditary predisposition to develop cancer. FORCE provides information about risk reduction, advocacy, legislation, and clinical trials.  Additionally, FORCE provides a platform for collaboration and support; which includes: peer navigation, message boards, local support groups, a toll-free helpline, research registry and recruitment, advocate training, published medical research, webinars, brochures, mastectomy photos, and more.  For more information, visit www.facingourrisk.org     PLAN:  Ms.  Prout is not interested in bilateral mastectomies at this time given that she has mentally prepared for a lumpectomy.  She stated that she may be interested in bilateral mastectomies in the future.  At this time, she is interested in proceeding with high risk screening post-treatment.  Her oncology team was notified.  We discussed that she should consider BSO at the appropriate time.  Dr. Chryl Heck can make appropriate referral.  PCP notified.  Pancreatic cancer screening is not indicated for Ms. Hara at this time given that she does not have a known family history of pancreatic cancer.   Family sharing letter sent to Ms. Cressey to encourage family testing.  She knows that her adult children, siblings, and other relatives are recommended to consider testing.    We encouraged Ms. Baylock to remain in contact with [place] on an annual basis so we can update her personal and family histories, and let her know of advances in cancer genetics that may benefit the family. Our contact number was provided. Ms. Moder questions were  answered to her satisfaction today, and she knows she is welcome to call anytime with additional questions.        Caydence Koenig M. Joette Catching, [place], [facility] Certified Genetic Counselor [email or web address] (P) [phone]

## 2021-12-11 LAB — SURGICAL PATHOLOGY

## 2021-12-13 ENCOUNTER — Telehealth: Payer: Self-pay | Admitting: *Deleted

## 2021-12-13 ENCOUNTER — Encounter (HOSPITAL_COMMUNITY): Payer: Self-pay | Admitting: Emergency Medicine

## 2021-12-13 ENCOUNTER — Encounter: Payer: Self-pay | Admitting: *Deleted

## 2021-12-13 ENCOUNTER — Emergency Department (HOSPITAL_COMMUNITY)
Admission: EM | Admit: 2021-12-13 | Discharge: 2021-12-13 | Disposition: A | Discharge status: Home / Self Care | Payer: Managed Care, Other (non HMO) | Attending: Emergency Medicine | Admitting: Emergency Medicine

## 2021-12-13 DIAGNOSIS — Z7982 Long term (current) use of aspirin: Secondary | ICD-10-CM | POA: Diagnosis not present

## 2021-12-13 DIAGNOSIS — E119 Type 2 diabetes mellitus without complications: Secondary | ICD-10-CM | POA: Diagnosis not present

## 2021-12-13 DIAGNOSIS — Z4800 Encounter for change or removal of nonsurgical wound dressing: Secondary | ICD-10-CM | POA: Insufficient documentation

## 2021-12-13 DIAGNOSIS — Z5189 Encounter for other specified aftercare: Secondary | ICD-10-CM

## 2021-12-13 DIAGNOSIS — Z7984 Long term (current) use of oral hypoglycemic drugs: Secondary | ICD-10-CM | POA: Diagnosis not present

## 2021-12-13 DIAGNOSIS — Z853 Personal history of malignant neoplasm of breast: Secondary | ICD-10-CM | POA: Diagnosis not present

## 2021-12-13 NOTE — Discharge Instructions (Signed)
Likely you have a hematoma underneath your left arm, please keep the area covered, I would like you to follow-up with your general surgeon, please call the office for further evaluation, I spoke with Dr. Brantley Stage general surgery today who recommends this.   Come back to the emergency department if you develop chest pain, shortness of breath, severe abdominal pain, uncontrolled nausea, vomiting, diarrhea.

## 2021-12-13 NOTE — ED Triage Notes (Addendum)
Pt had lumpectomy and 3 lymph nodes removed last Thurs the 14th without issues and complications. She states she took shower and felt large lump form rapidly. Suture lis approximated and no signs of infection. She has a dull throb there now and like suture line is being pulled on.

## 2021-12-13 NOTE — Telephone Encounter (Signed)
Received order for oncotype testing. Requisition faxed to pathology and [facility] °

## 2021-12-13 NOTE — ED Provider Notes (Signed)
[facility] DEPT Provider Note   CSN: [id number] Arrival date & time: 12/13/21  0002     History  Chief Complaint  Patient presents with   Post-op Problem    Kari Torres is a 56 y.o. female.  HPI   Patient with medical history including diabetes, invasive ductal carcinoma, presents complaints of left armpit swelling.  Patient states that she recent got surgery a week and a half ago, states she had no complications from it, she states while she was in the shower today she felt a bulge in that arm, states it got larger, states is about the size of a baseball, denies any pain to the area, denies any paresthesia or weakness moving down her arm, no drainage or discharge from the area, no fevers or chills.  She states that while she has been in the ED she started to notice some blood coming from her incision site, not on any blood thinners.   Reviewed patient's chart and left lumpectomy as well as sentinel node biopsy on 4/14 by Dr. Barry Dienes uncomplicated surgery, has not undergone radiation or chemotherapy at this time.  Home Medications Prior to Admission medications   Medication Sig Start Date End Date Taking? Authorizing Provider  aspirin 81 MG tablet Take 81 mg by mouth daily.    [provider]  atenolol (TENORMIN) 100 MG tablet Take 100 mg by mouth daily.    [provider]  atorvastatin (LIPITOR) 20 MG tablet Take 20 mg by mouth daily.    [provider]  Collagen-Vitamin C (COLLAGEN PLUS VITAMIN C PO) Take by mouth.    [provider]  diltiazem (DILACOR XR) 240 MG 24 hr capsule Take 240 mg by mouth daily.    [provider]  FARXIGA 5 MG TABS tablet Take 5 mg by mouth daily. 08/27/21   [provider]  hydrochlorothiazide (HYDRODIURIL) 25 MG tablet Take 25 mg by mouth daily.    [provider]  loratadine (CLARITIN) 10 MG tablet Take 10 mg by mouth daily as needed for allergies.     [provider]  LUTEIN PO Take by mouth.    [provider]  metFORMIN (GLUCOPHAGE) 1000 MG tablet Take 1,000 mg by mouth 2 (two) times daily. 10/05/21   [provider]  metFORMIN (GLUCOPHAGE) 500 MG tablet Take 500 mg by mouth 2 (two) times daily with a meal.    [provider]  Multiple Vitamins-Minerals (CENTRUM ADULTS PO) Take 1 tablet by mouth daily.    [provider]  omega-3 acid ethyl esters (LOVAZA) 1 g capsule Take by mouth 2 (two) times daily.    [provider]  oxyCODONE (OXY IR/ROXICODONE) 5 MG immediate release tablet Take 1 tablet (5 mg total) by mouth every 6 (six) hours as needed for severe pain. 12/06/21   Stark Klein, MD  TIADYLT ER 240 MG 24 hr capsule Take 240 mg by mouth daily. 10/10/21   [provider]      Allergies    Iodine and Shellfish allergy    Review of Systems   Review of Systems  Constitutional:  Negative for chills and fever.  Respiratory:  Negative for shortness of breath.   Cardiovascular:  Negative for chest pain.  Gastrointestinal:  Negative for abdominal pain.  Skin:  Positive for wound.  Neurological:  Negative for headaches.    Physical Exam Updated Vital Signs BP 131/88   Pulse 90   Temp 98.2 F (36.8  C) (Oral)   Resp 18   Ht '5\' 6"'$  (1.676 m)   Wt 63.5 kg   SpO2 99%   BMI 22.60 kg/m  Physical Exam Vitals and nursing note reviewed.  Constitutional:      General: She is not in acute distress.    Appearance: She is not ill-appearing.  HENT:     Head: Normocephalic and atraumatic.     Nose: No congestion.  Eyes:     Conjunctiva/sclera: Conjunctivae normal.  Cardiovascular:     Rate and Rhythm: Normal rate and regular rhythm.     Pulses: Normal pulses.  Pulmonary:     Effort: Pulmonary effort is normal.  Musculoskeletal:     Comments: Patient is a lump underneath her left axilla, proximal the size of a baseball, with fluctuance present no induration noted, no  erythema present, incision site was beneath it which is still intact, with slight blood oozing from the area, area was warm nontender to palpation she is moving her left upper EXTR without difficulty all compartments are soft 2+ radial pulses.  Skin:    General: Skin is warm and dry.  Neurological:     Mental Status: She is alert.  Psychiatric:        Mood and Affect: Mood normal.     ED Results / Procedures / Treatments   Labs (all labs ordered are listed, but only abnormal results are displayed) Labs Reviewed - No data to display  EKG None  Radiology No results found.  Procedures Procedures    Medications Ordered in ED Medications - No data to display  ED Course/ Medical Decision Making/ A&P                           Medical Decision Making  This patient presents to the ED for concern of wound, this involves an extensive number of treatment options, and is a complaint that carries with it a high risk of complications and morbidity.  The differential diagnosis includes cellulitis, blood clot, compartment syndrome    Additional history obtained:  Additional history obtained from family at bedside External records from outside source obtained and reviewed including surgical notes   Co morbidities that complicate the patient evaluation  Breast cancer  Social Determinants of Health:  N/A    Lab Tests:  I Ordered, and personally interpreted labs.  The pertinent results include: N/A   Imaging Studies ordered:  I ordered imaging studies including N/A I independently visualized and interpreted imaging which showed N/A I agree with the radiologist interpretation   Cardiac Monitoring:  The patient was maintained on a cardiac monitor.  I personally viewed and interpreted the cardiac monitored which showed an underlying rhythm of: N/A   Medicines ordered and prescription drug management:  I ordered medication including N/A I have reviewed the patients  home medicines and have made adjustments as needed  Critical Interventions:  N/A   Reevaluation:  Presents with a lump underneath her left axilla, exam seems most consistent with spontaneous hematoma, no evidence of infection, no evidence of compartment syndrome, will consult with general surgery for further recommendations  Updated patient recommendation agreement this plan is ready for discharge.  Consultations Obtained:  I requested consultation with the with Dr. Brantley Stage general surgery,  and discussed lab and imaging findings as well as pertinent plan - they recommend: Recommends following up with the office today have patient call to schedule an appointment.    Test  Considered:  CT imaging-will defer to my suspicion for abscess is very low at this time, presentation atypical, would not form instantaneously after shower, would expect erythema drainage discharge which is not present at this time.    Rule out Low suspicion for DVT/compartment syndrome as to be atypical to develop blood clot instantaneously, she has no neuro deficits present in the left arm, all compartments are are soft, she has 2+ radial pulses full range of motion her fingers wrist and elbow.    Dispostion and problem list  After consideration of the diagnostic results and the patients response to treatment, I feel that the patent would benefit from discharge.  Axilla mass-likely a spontaneous hematoma, will have her follow-up with her general surgeon for further evaluation and strict return precautions.            Final Clinical Impression(s) / ED Diagnoses Final diagnoses:  Visit for wound check    Rx / DC Orders ED Discharge Orders     None         Marcello Fennel, PA-C 82/06/01 5615    Delora Fuel, MD 37/94/32 [phone]

## 2021-12-21 ENCOUNTER — Encounter: Payer: Self-pay | Admitting: Hematology and Oncology

## 2021-12-21 ENCOUNTER — Inpatient Hospital Stay: Payer: Managed Care, Other (non HMO) | Admitting: Hematology and Oncology

## 2021-12-21 ENCOUNTER — Other Ambulatory Visit: Payer: Self-pay

## 2021-12-21 DIAGNOSIS — Z79899 Other long term (current) drug therapy: Secondary | ICD-10-CM | POA: Diagnosis not present

## 2021-12-21 DIAGNOSIS — I10 Essential (primary) hypertension: Secondary | ICD-10-CM | POA: Diagnosis not present

## 2021-12-21 DIAGNOSIS — Z170 Estrogen receptor positive status [ER+]: Secondary | ICD-10-CM

## 2021-12-21 DIAGNOSIS — C50412 Malignant neoplasm of upper-outer quadrant of left female breast: Secondary | ICD-10-CM

## 2021-12-21 DIAGNOSIS — Z7982 Long term (current) use of aspirin: Secondary | ICD-10-CM | POA: Diagnosis not present

## 2021-12-21 DIAGNOSIS — Z7984 Long term (current) use of oral hypoglycemic drugs: Secondary | ICD-10-CM | POA: Diagnosis not present

## 2021-12-21 NOTE — Progress Notes (Signed)
[facility] NOTE  Patient Care Team: Alroy Dust, L.Marlou Sa, MD as PCP - General (Family Medicine) Rockwell Germany, RN as Oncology Nurse Navigator Mauro Kaufmann, RN as Oncology Nurse Navigator Stark Klein, MD as Consulting Physician (General Surgery) Benay Pike, MD as Consulting Physician (Hematology and Oncology) Kyung Rudd, MD as Consulting Physician (Radiation Oncology)  CHIEF COMPLAINTS/PURPOSE OF CONSULTATION:  Newly diagnosed breast cancer  HISTORY OF PRESENTING ILLNESS:   Kari Torres 56 y.o. female is here because of recent diagnosis of left breast invasive ductal carcinoma  I reviewed her records extensively and collaborated the history with the patient.  SUMMARY OF ONCOLOGIC HISTORY: Oncology History  Malignant neoplasm of upper-outer quadrant of left breast in female, estrogen receptor positive ([place])  11/06/2021 Mammogram   Bilateral screening mammogram shows left breast asymmetry which is indeterminate.  Additional views with possible ultrasound are recommended.  Left breast unilateral diagnostic mammogram confirmed a 1.6 x 1.1 cm upper outer aspect middle depth asymmetry with no other significant masses or calcifications.   11/12/2021 Pathology Results   Left breast needle core biopsy at 2:00 shows invasive ductal carcinoma, grade 2 prognostics ER 100% positive strong staining PR 50% positive moderate to strong staining Ki-67 of 5% and group 4 HER2 negative   11/19/2021 Initial Diagnosis   Malignant neoplasm of upper-outer quadrant of left breast in female, estrogen receptor positive ([place])   11/20/2021 Cancer Staging   Staging form: Breast, AJCC 8th Edition - Clinical stage from 11/20/2021: Stage IA (cT1c, cN0, cM0, G2, ER+, PR+, HER2-) - Signed by Hayden Pedro, PA-C on 11/20/2021 Method of lymph node assessment: Clinical Histologic grading system: 3 grade system   11/30/2021 Genetic Testing   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  [facility] BRCAPlus report date is 11/30/2021.  [facility] CancerNext-Expanded +RNAinsight report date is 12/06/2021.   The CancerNext-Expanded gene panel offered by [facility] and includes sequencing, rearrangement, and RNA analysis for the following 77 genes: AIP, ALK, APC, ATM, AXIN2, BAP1, BARD1, BLM, BMPR1A, BRCA1, BRCA2, BRIP1, CDC73, CDH1, CDK4, CDKN1B, CDKN2A, CHEK2, CTNNA1, DICER1, FANCC, FH, FLCN, GALNT12, KIF1B, LZTR1, MAX, MEN1, MET, MLH1, MSH2, MSH3, MSH6, MUTYH, NBN, NF1, NF2, NTHL1, PALB2, PHOX2B, PMS2, POT1, PRKAR1A, PTCH1, PTEN, RAD51C, RAD51D, RB1, RECQL, RET, SDHA, SDHAF2, SDHB, SDHC, SDHD, SMAD4, SMARCA4, SMARCB1, SMARCE1, STK11, SUFU, TMEM127, TP53, TSC1, TSC2, VHL and XRCC2 (sequencing and deletion/duplication); EGFR, EGLN1, HOXB13, KIT, MITF, PDGFRA, POLD1, and POLE (sequencing only); EPCAM and GREM1 (deletion/duplication only).     [name] is here for follow up after surgery. She is doing very well except for a hematoma which has been draining. She is ok with BSO, but would like to defer bilateral mastectomy at this time. Rest of the pertinent 10 point ROS reviewed and negative.  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    SURGICAL HISTORY: Past Surgical History:  Procedure Laterality Date   BREAST LUMPECTOMY Left 01/29/2017   Procedure: LEFT BREAST LUMPECTOMY SUBAREOLAR DISSECTION ERAS PATHWAY;  Surgeon: Fanny Skates, MD;  Location: [facility];  Service: General;  Laterality: Left;  LMA   BREAST LUMPECTOMY WITH RADIOACTIVE SEED AND SENTINEL  LYMPH NODE BIOPSY Left 12/06/2021   Procedure: LEFT BREAST LUMPECTOMY WITH RADIOACTIVE SEED AND  SENTINEL LYMPH NODE BIOPSY;  Surgeon: Byerly, Faera, MD;  Location: [facility];  Service: General;  Laterality: Left;   BREAST LUMPECTOMY WITH RADIOACTIVE SEED LOCALIZATION Left 09/15/2015   Procedure: LEFT BREAST LUMPECTOMY WITH RADIOACTIVE SEED LOCALIZATION;  Surgeon: Haywood Ingram, MD;  Location: [facility];  Service: General;  Laterality: Left;   CESAREAN SECTION     WISDOM TOOTH EXTRACTION      SOCIAL HISTORY: Social History   Socioeconomic History   Marital status: Divorced    Spouse name: Not on file   Number of children: Not on file   Years of education: Not on file   Highest education level: Not on file  Occupational History   Not on file  Tobacco Use   Smoking status: Never   Smokeless tobacco: Never  Vaping Use   Vaping Use: Never used  Substance and Sexual Activity   Alcohol use: Yes    Comment: occasional   Drug use: No   Sexual activity: Not on file  Other Topics Concern   Not on file  Social History Narrative   Not on file   Social Determinants of Health   Financial Resource Strain: Low Risk  (11/21/2021)   Overall Financial Resource Strain (CARDIA)    Difficulty of Paying Living Expenses: Not very hard  Food Insecurity: No Food Insecurity (11/21/2021)   Hunger Vital Sign    Worried About Running Out of Food in the Last Year: Never true    Ran Out of Food in the Last Year: Never true  Transportation Needs: No Transportation Needs (11/21/2021)   PRAPARE - Transportation    Lack of Transportation (Medical): No    Lack of Transportation (Non-Medical): No  Physical Activity: Not on file  Stress: Not on file  Social Connections: Not on file  Intimate Partner Violence: Not on file    FAMILY HISTORY: Family History  Problem Relation Age of Onset   Hypertension Mother    Breast cancer Mother 59       met d. 60   Hypertension Father    Diabetes Father    CAD Father    Brain cancer Maternal Aunt        dx 60s; d. 60s    Cancer Maternal Uncle        unknown type; ? lung;  dx 60s   Hypertension Maternal Grandmother    Diabetes Maternal Grandmother    Congestive Heart Failure Maternal Grandmother    Diabetes Paternal Grandmother    Diabetes Paternal Grandfather    Breast cancer Other        MGM's sister with breast cancer or other primary   Brain cancer Cousin        mat female cousin; dx 20s; d. 20s   Bladder Cancer Half-Brother        dx 50s; paternal half brother    ALLERGIES:  is allergic to iodine and shellfish allergy.  MEDICATIONS:  Current Outpatient Medications  Medication Sig Dispense Refill   aspirin 81 MG tablet Take 81 mg by mouth daily.     atenolol (TENORMIN) 100 MG tablet Take 100 mg by mouth daily.     atorvastatin (LIPITOR) 20 MG tablet Take 20 mg by mouth daily.     Collagen-Vitamin C (COLLAGEN PLUS VITAMIN C PO) Take by mouth.     diltiazem (DILACOR XR) 240 MG 24 hr capsule Take   240 mg by mouth daily.     FARXIGA 5 MG TABS tablet Take 5 mg by mouth daily.     hydrochlorothiazide (HYDRODIURIL) 25 MG tablet Take 25 mg by mouth daily.     loratadine (CLARITIN) 10 MG tablet Take 10 mg by mouth daily as needed for allergies.     LUTEIN PO Take by mouth.     metFORMIN (GLUCOPHAGE) 1000 MG tablet Take 1,000 mg by mouth 2 (two) times daily.     metFORMIN (GLUCOPHAGE) 500 MG tablet Take 500 mg by mouth 2 (two) times daily with a meal.     Multiple Vitamins-Minerals (CENTRUM ADULTS PO) Take 1 tablet by mouth daily.     omega-3 acid ethyl esters (LOVAZA) 1 g capsule Take by mouth 2 (two) times daily.     oxyCODONE (OXY IR/ROXICODONE) 5 MG immediate release tablet Take 1 tablet (5 mg total) by mouth every 6 (six) hours as needed for severe pain. 5 tablet 0   TIADYLT ER 240 MG 24 hr capsule Take 240 mg by mouth daily.     No current facility-administered medications for this visit.    REVIEW OF SYSTEMS:   Constitutional: Denies fevers, chills or abnormal night sweats Eyes: Denies  blurriness of vision, double vision or watery eyes Ears, nose, mouth, throat, and face: Denies mucositis or sore throat Respiratory: Denies cough, dyspnea or wheezes Cardiovascular: Denies palpitation, chest discomfort or lower extremity swelling Gastrointestinal:  Denies nausea, heartburn or change in bowel habits Skin: Denies abnormal skin rashes Lymphatics: Denies new lymphadenopathy or easy bruising Neurological:Denies numbness, tingling or new weaknesses Behavioral/Psych: Mood is stable, no new changes  Breast: Denies any palpable lumps or discharge All other systems were reviewed with the patient and are negative.  PHYSICAL EXAMINATION: ECOG PERFORMANCE STATUS: 0 - Asymptomatic  Vitals:   12/21/21 1345  BP: (!) 152/82  Pulse: 71  Resp: 14  Temp: 98.1 F (36.7 C)  SpO2: 100%   Filed Weights   12/21/21 1345  Weight: 144 lb (65.3 kg)    GENERAL:alert, no distress and comfortable Post op changes right breast,   LABORATORY DATA:  I have reviewed the data as listed Lab Results  Component Value Date   WBC 6.3 11/21/2021   HGB 13.9 11/21/2021   HCT 41.2 11/21/2021   MCV 88.6 11/21/2021   PLT 359 11/21/2021   Lab Results  Component Value Date   NA 136 11/21/2021   K 3.8 11/21/2021   CL 97 (L) 11/21/2021   CO2 31 11/21/2021    RADIOGRAPHIC STUDIES: I have personally reviewed the radiological reports and agreed with the findings in the report.  ASSESSMENT AND PLAN:  Malignant neoplasm of upper-outer quadrant of left breast in female, estrogen receptor positive (HCC) This is a very pleasant 56-year-old female patient with newly diagnosed left breast grade 2 invasive ductal carcinoma, ER/PR positive, HER2 negative by FISH referred to breast [facility] for additional recommendations.  We have discussed about proceeding with upfront surgery given the small size of the tumor and node negative as well as strong ER/PR positivity.  Following surgery, she will proceed with Oncotype  testing.   Oncotype requisition sent last week. Awaiting oncotype results. She will see Dr Byerly for follow up on Monday. If oncotype returns as no benefit from chemo, she will proceed with adjuvant radiation and anti estrogen therapy. We discussed pathology in detail today. We also discussed about PALB2 gene. She is ok with BSO but would like to defer bilateral   mastectomy. No known FH of pancreatic cancer, hence no definite role for pancreatic cancer screening. We will refer her to gyn onc after completion of radiation.  Total time spent: 30 minutes including history, physical exam, review of records, counseling and coordination of care  All questions were answered. The patient knows to call the clinic with any problems, questions or concerns.    Praveena Iruku, MD 12/21/21 

## 2021-12-21 NOTE — Assessment & Plan Note (Addendum)
This is a very pleasant 57 year old female patient with newly diagnosed left breast grade 2 invasive ductal carcinoma, ER/PR positive, HER2 negative by FISH referred to breast [facility] for additional recommendations.  We have discussed about proceeding with upfront surgery given the small size of the tumor and node negative as well as strong ER/PR positivity.  Following surgery, she will proceed with Oncotype testing.   Oncotype requisition sent last week. Awaiting oncotype results. She will see Dr Barry Dienes for follow up on Monday. If oncotype returns as no benefit from chemo, she will proceed with adjuvant radiation and anti estrogen therapy. We discussed pathology in detail today. We also discussed about PALB2 gene. She is ok with BSO but would like to defer bilateral mastectomy. No known FH of pancreatic cancer, hence no definite role for pancreatic cancer screening. We will refer her to gyn onc after completion of radiation.

## 2021-12-24 ENCOUNTER — Telehealth: Payer: Self-pay | Admitting: Hematology and Oncology

## 2021-12-24 ENCOUNTER — Encounter (HOSPITAL_COMMUNITY): Payer: Self-pay

## 2021-12-24 NOTE — Telephone Encounter (Signed)
Scheduled appointment per 9/29 los. Patient is aware.

## 2021-12-24 NOTE — Progress Notes (Signed)
Radiation Oncology         [phone] ________________________________  Name: Kari Torres        MRN: [id number]  Date of Service: 12/27/2021 DOB: 1966-01-08  PJ:[id number], L.Marlou Sa, MD  Benay Pike, MD     REFERRING PHYSICIAN: Benay Pike, MD   DIAGNOSIS: The encounter diagnosis was Malignant neoplasm of upper-outer quadrant of left breast in female, estrogen receptor positive ([place]).   HISTORY OF PRESENT ILLNESS: Kari Torres is a 56 y.o. female seen in the multidisciplinary breast clinic for a new diagnosis of left breast cancer. 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 Dalbert Batman and pathology showed papillomatous changes without malignancy. She has been followed and recent mammogram showed a developing left breast asymmetry.  A left breast ultrasound showed a 1.5 cm irregular mass in the 2:00 position, and no abnormalities were seen in the left axilla. 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   Since her last visit, the patient underwent a left breast lumpectomy with sentinel lymph node biopsy on 12/06/2021 that showed a grade 2 invasive ductal carcinoma measuring 1.5 cm involving the inked posterior margin.  Biopsy site changes were noted.  Additional specimens from the superior medial inferior and anterior margin were negative for disease.  The lateral margin excision showed grade 2 invasive ductal carcinoma 2 mm from the inked new lateral margin.  Within the sentinel specimens, 3 lymph nodes were noted all negative for malignancy.  No additional surgery is planned given that her posterior margin is her muscle.  Oncotype Dx score was***.  She has also been found to have a PALB2 gene mutation and is planning BSO but is deferring additional breast surgery at this time.  She is seen to discuss  adjuvant radiotherapy to the left breast.    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       PAST SURGICAL HISTORY: Past Surgical History:  Procedure Laterality Date   BREAST LUMPECTOMY Left 01/29/2017   Procedure: LEFT BREAST LUMPECTOMY SUBAREOLAR DISSECTION ERAS PATHWAY;  Surgeon: Fanny Skates, MD;  Location: [facility];  Service: General;  Laterality: Left;  LMA   BREAST LUMPECTOMY WITH RADIOACTIVE SEED AND SENTINEL LYMPH NODE BIOPSY Left 12/06/2021   Procedure: LEFT BREAST LUMPECTOMY WITH RADIOACTIVE SEED AND SENTINEL LYMPH NODE BIOPSY;  Surgeon: Stark Klein, MD;  Location: [facility];  Service: General;  Laterality: Left;   BREAST LUMPECTOMY WITH RADIOACTIVE SEED LOCALIZATION Left 09/15/2015   Procedure: LEFT BREAST LUMPECTOMY WITH RADIOACTIVE SEED LOCALIZATION;  Surgeon: Fanny Skates, MD;  Location: [facility];  Service: General;  Laterality: Left;   CESAREAN SECTION     WISDOM TOOTH EXTRACTION       FAMILY HISTORY:  Family History  Problem Relation Age of Onset   Hypertension Mother    Breast cancer Mother 13       met d. 42   Hypertension  Father    Diabetes Father    CAD Father    Brain cancer Maternal Aunt        dx 40s; d. 46s   Cancer Maternal Uncle        unknown type; ? lung;  dx 60s   Hypertension Maternal Grandmother    Diabetes Maternal Grandmother    Congestive Heart Failure Maternal Grandmother    Diabetes Paternal Grandmother    Diabetes Paternal Grandfather    Breast cancer Other        MGM's sister with breast cancer or other primary   Brain cancer Cousin         mat female cousin; dx 64s; d. 57s   Bladder Cancer Half-Brother        dx 61s; paternal half brother     SOCIAL HISTORY:  reports that she has never smoked. She has never used smokeless tobacco. She reports current alcohol use. She reports that she does not use drugs. The patient is divorced and lives in [place]. 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 She has two adult children and is accompanied by her close friend.    ALLERGIES: Iodine and Shellfish allergy   MEDICATIONS:  Current Outpatient Medications  Medication Sig Dispense Refill   aspirin 81 MG tablet Take 81 mg by mouth daily.     atenolol (TENORMIN) 100 MG tablet Take 100 mg by mouth daily.     atorvastatin (LIPITOR) 20 MG tablet Take 20 mg by mouth daily.     Collagen-Vitamin C (COLLAGEN PLUS VITAMIN C PO) Take by mouth.     diltiazem (DILACOR XR) 240 MG 24 hr capsule Take 240 mg by mouth daily.     FARXIGA 5 MG TABS tablet Take 5 mg by mouth daily.     hydrochlorothiazide (HYDRODIURIL) 25 MG tablet Take 25 mg by mouth daily.     loratadine (CLARITIN) 10 MG tablet Take 10 mg by mouth daily as needed for allergies.     LUTEIN PO Take by mouth.     metFORMIN (GLUCOPHAGE) 1000 MG tablet Take 1,000 mg by mouth 2 (two) times daily.     metFORMIN (GLUCOPHAGE) 500 MG tablet Take 500 mg by mouth 2 (two) times daily with a meal.     Multiple Vitamins-Minerals (CENTRUM ADULTS PO) Take 1 tablet by mouth daily.     omega-3 acid ethyl esters (LOVAZA) 1 g capsule Take by mouth 2 (two) times daily.     oxyCODONE (OXY IR/ROXICODONE) 5 MG immediate release tablet Take 1 tablet (5 mg total) by mouth every 6 (six) hours as needed for severe pain. 5 tablet 0   TIADYLT ER 240 MG 24 hr capsule Take 240 mg by mouth daily.     No current facility-administered medications for this visit.     REVIEW OF SYSTEMS: On review of systems, the patient reports that she is doing well overall and reports she has felt better  about her diagnosis overall since meeting with our team today.     PHYSICAL EXAM:  Wt Readings from Last 3 Encounters:  12/21/21 144 lb (65.3 kg)  12/13/21 140 lb (63.5 kg)  12/06/21 139 lb 8.8 oz (63.3 kg)   Temp Readings from Last 3 Encounters:  12/21/21 98.1 F (36.7 C) (Temporal)  12/13/21 98.2 F (36.8 C) (Oral)  12/06/21 97.9 F (36.6 C)   BP Readings from Last 3 Encounters:  12/21/21 (!) 152/82  12/13/21 131/88  12/06/21  139/79   Pulse Readings from Last 3 Encounters:  12/21/21 71  12/13/21 90  12/06/21 71    In general this is a well appearing African American female in no acute distress. She's alert and oriented x4 and appropriate throughout the examination. Cardiopulmonary assessment is negative for acute distress and she exhibits normal effort.  Her left breast incision is well-healed as is her axillary incision site.  Neither site appeared to have separation, erythema, or drainage.   ECOG = ***  0 - Asymptomatic (Fully active, able to carry on all predisease activities without restriction)  1 - Symptomatic but completely ambulatory (Restricted in physically strenuous activity but ambulatory and able to carry out work of a light or sedentary nature. For example, light housework, office work)  2 - Symptomatic, <50% in bed during the day (Ambulatory and capable of all self care but unable to carry out any work activities. Up and about more than 50% of waking hours)  3 - Symptomatic, >50% in bed, but not bedbound (Capable of only limited self-care, confined to bed or chair 50% or more of waking hours)  4 - Bedbound (Completely disabled. Cannot carry on any self-care. Totally confined to bed or chair)  5 - Death   Eustace Pen MM, Creech RH, Tormey DC, et al. [phone]). "Toxicity and response criteria of the [facility]". [place] Oncol. 5 (6): 649-55    LABORATORY DATA:  Lab Results  Component Value Date   WBC 6.3 11/21/2021   HGB 13.9  11/21/2021   HCT 41.2 11/21/2021   MCV 88.6 11/21/2021   PLT 359 11/21/2021   Lab Results  Component Value Date   NA 136 11/21/2021   K 3.8 11/21/2021   CL 97 (L) 11/21/2021   CO2 31 11/21/2021   Lab Results  Component Value Date   ALT 17 11/21/2021   AST 20 11/21/2021   ALKPHOS 58 11/21/2021   BILITOT 0.8 11/21/2021      RADIOGRAPHY: No results found.     IMPRESSION/PLAN: 1. Stage IA, pT1cN0M0, grade 2, ER/PR positive invasive ductal carcinoma of the left breast. Dr. Lisbeth Renshaw has reviewed her final pathology findings and I reviewed the nature of early stage left breast disease. She has done well since surgery and does not need systemic chemotherapy.  Dr. Lisbeth Renshaw does recommend external radiotherapy to the breast  to reduce risks of local recurrence followed by antiestrogen therapy. We discussed the risks, benefits, short, and long term effects of radiotherapy, as well as the curative intent, and the patient is interested in proceeding.  We reviewed the delivery and logistics of radiotherapy and Dr. Lisbeth Renshaw recommends 4 weeks of radiotherapy to the left breast with deep inspiration breath hold technique. Written consent is obtained and placed in the chart, a copy was provided to the patient. The patient will be contacted to coordinate treatment planning by our simulation department.  2. PALB2 gene mutation. She will meet with GYN oncology to discuss bilateral salpingo-oophorectomy to reduce risks of ovarian cancer.  She will consider additional breast surgery versus heightened screening and surveillance based on this as well.  In a visit lasting ***minutes, greater than 50% of the time was spent face to face reviewing her case, as well as in preparation of, discussing, and coordinating the patient's care.     Carola Rhine, [facility]    **Disclaimer: This note was dictated with voice recognition software. Similar sounding words can inadvertently be transcribed and this note may contain  transcription errors which may not have been corrected upon publication of note.**

## 2021-12-26 ENCOUNTER — Telehealth: Payer: Self-pay | Admitting: Hematology and Oncology

## 2021-12-26 NOTE — Telephone Encounter (Signed)
Contacted patient to scheduled appointments. Patient is aware of appointments that are scheduled.   

## 2021-12-27 ENCOUNTER — Ambulatory Visit
Admission: RE | Admit: 2021-12-27 | Discharge: 2021-12-27 | Disposition: A | Discharge status: Home / Self Care | Payer: Managed Care, Other (non HMO) | Source: Ambulatory Visit | Attending: Radiation Oncology | Admitting: Radiation Oncology

## 2021-12-27 ENCOUNTER — Ambulatory Visit: Payer: Managed Care, Other (non HMO) | Admitting: Rehabilitation

## 2021-12-27 ENCOUNTER — Telehealth: Payer: Self-pay | Admitting: *Deleted

## 2021-12-27 ENCOUNTER — Ambulatory Visit: Payer: Managed Care, Other (non HMO) | Attending: General Surgery

## 2021-12-27 ENCOUNTER — Encounter: Payer: Self-pay | Admitting: Radiation Oncology

## 2021-12-27 ENCOUNTER — Encounter: Payer: Self-pay | Admitting: *Deleted

## 2021-12-27 VITALS — BP 169/98 | HR 80 | Temp 96.4°F | Resp 18 | Ht 66.0 in | Wt 143.5 lb

## 2021-12-27 DIAGNOSIS — Z170 Estrogen receptor positive status [ER+]: Secondary | ICD-10-CM | POA: Insufficient documentation

## 2021-12-27 DIAGNOSIS — Z1501 Genetic susceptibility to malignant neoplasm of breast: Secondary | ICD-10-CM | POA: Diagnosis not present

## 2021-12-27 DIAGNOSIS — E119 Type 2 diabetes mellitus without complications: Secondary | ICD-10-CM | POA: Diagnosis not present

## 2021-12-27 DIAGNOSIS — Z7984 Long term (current) use of oral hypoglycemic drugs: Secondary | ICD-10-CM | POA: Diagnosis not present

## 2021-12-27 DIAGNOSIS — C50412 Malignant neoplasm of upper-outer quadrant of left female breast: Secondary | ICD-10-CM | POA: Diagnosis present

## 2021-12-27 DIAGNOSIS — I10 Essential (primary) hypertension: Secondary | ICD-10-CM | POA: Insufficient documentation

## 2021-12-27 DIAGNOSIS — R293 Abnormal posture: Secondary | ICD-10-CM | POA: Insufficient documentation

## 2021-12-27 DIAGNOSIS — Z7982 Long term (current) use of aspirin: Secondary | ICD-10-CM | POA: Diagnosis not present

## 2021-12-27 DIAGNOSIS — Z809 Family history of malignant neoplasm, unspecified: Secondary | ICD-10-CM | POA: Insufficient documentation

## 2021-12-27 DIAGNOSIS — Z803 Family history of malignant neoplasm of breast: Secondary | ICD-10-CM | POA: Diagnosis not present

## 2021-12-27 DIAGNOSIS — Z8052 Family history of malignant neoplasm of bladder: Secondary | ICD-10-CM | POA: Insufficient documentation

## 2021-12-27 DIAGNOSIS — E7800 Pure hypercholesterolemia, unspecified: Secondary | ICD-10-CM | POA: Insufficient documentation

## 2021-12-27 DIAGNOSIS — M7981 Nontraumatic hematoma of soft tissue: Secondary | ICD-10-CM | POA: Diagnosis present

## 2021-12-27 DIAGNOSIS — Z79899 Other long term (current) drug therapy: Secondary | ICD-10-CM | POA: Insufficient documentation

## 2021-12-27 NOTE — Progress Notes (Signed)
Nursing interview for Malignant neoplasm of upper-outer quadrant of left breast in female, estrogen receptor positive ([place]). I verified patient's identity and began interview. Patient reports a LT axilla hematoma w/o pain, that's draining dark red blood. It is being treated w/ Doxycycline and is healing well. No other issues reported at this time.  Meaningful use complete. Postmenopausal - NO chances of pregnancy.  BP (!) 169/98 (BP Location: Right Arm, Patient Position: Sitting, Cuff Size: Normal)   Pulse 80   Temp (!) 96.4 F (35.8 C) (Temporal)   Resp 18   Ht '5\' 6"'$  (1.676 m)   Wt 143 lb 8 oz (65.1 kg)   SpO2 100%   BMI 23.16 kg/m

## 2021-12-27 NOTE — Patient Instructions (Signed)
     [facility]  [address], [address]  [place] [address]  [phone]  After Breast Cancer Class It is recommended you attend the ABC class to be educated on lymphedema risk reduction. This class is free of charge and lasts for 1 hour. It is a 1-time class. You will need to download the Webex app either on your phone or computer. We will send you a link the night before or the morning of the class. You should be able to click on that link to join the class. This is not a confidential class. You don't have to turn your camera on, but other participants may be able to see your email address.  Scar massage You can begin gentle scar massage to you incision sites. Gently place one hand on the incision and move the skin (without sliding on the skin) in various directions. Do this for a few minutes and then you can gently massage either coconut oil or vitamin E cream into the scars.  Compression garment You should continue wearing your compression bra until you feel like you no longer have swelling.  Home exercise Program Continue doing the exercises you were given until you feel like you can do them without feeling any tightness at the end.   Walking Program Studies show that 30 minutes of walking per day (fast enough to elevate your heart rate) can significantly reduce the risk of a cancer recurrence. If you can't walk due to other medical reasons, we encourage you to find another activity you could do (like a stationary bike or water exercise).  Posture After breast cancer surgery, people frequently sit with rounded shoulders posture because it puts their incisions on slack and feels better. If you sit like this and scar tissue forms in that position, you can become very tight and have pain sitting or standing with good posture. Try to be aware of your posture and sit and stand up tall to heal properly.  Follow up PT: It is recommended you return every 3 months for  the first 2 years following surgery to be assessed on the SOZO machine for an L-Dex score. This helps prevent clinically significant lymphedema in 95% of patients. These follow up screens are 10 minute appointments that you are not billed for.  

## 2021-12-27 NOTE — Telephone Encounter (Signed)
Received oncotype score of 25. Physician team notified. Called pt with results. Discussed chemo not recommended at this time. XRT is next step. Received verbal understanding. Pt was seen today by dr. Lisbeth Renshaw.

## 2021-12-27 NOTE — Therapy (Signed)
OUTPATIENT PHYSICAL THERAPY BREAST CANCER POST OP FOLLOW UP   Patient Name: Kari Torres MRN: [id number] DOB:[birth year], 56 y.o., female Today's Date: 12/27/2021   PT End of Session - 12/27/21 1351     Visit Number 2    Number of Visits 10    Date for PT Re-Evaluation 01/24/22    PT Start Time 6122    PT Stop Time 1435    PT Time Calculation (min) 42 min    Activity Tolerance Patient tolerated treatment well    Behavior During Therapy [facility] for tasks assessed/performed             Past Medical History:  Diagnosis Date   Allergic rhinitis due to pollen    Bloody discharge from left nipple 01/29/2017   Breast cancer ([place])    Diabetes mellitus without complication ([place])    Family history of breast cancer 11/21/2021   Hypercholesterolemia    Hypertension    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   Past Surgical History:  Procedure Laterality Date   BREAST LUMPECTOMY Left 01/29/2017   Procedure: LEFT BREAST LUMPECTOMY SUBAREOLAR DISSECTION ERAS PATHWAY;  Surgeon: Fanny Skates, MD;  Location: [facility];  Service: General;  Laterality: Left;  LMA   BREAST LUMPECTOMY WITH RADIOACTIVE SEED AND SENTINEL LYMPH NODE BIOPSY Left 12/06/2021   Procedure: LEFT BREAST LUMPECTOMY WITH RADIOACTIVE SEED AND SENTINEL LYMPH NODE BIOPSY;  Surgeon: Stark Klein, MD;  Location: [facility];  Service: General;  Laterality: Left;   BREAST LUMPECTOMY WITH RADIOACTIVE SEED LOCALIZATION Left 09/15/2015   Procedure: LEFT BREAST LUMPECTOMY WITH RADIOACTIVE SEED LOCALIZATION;  Surgeon: Fanny Skates, MD;  Location: [facility];  Service: General;  Laterality: Left;   [place] EXTRACTION     Patient Active Problem List   Diagnosis Date Noted   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   Genetic testing 11/30/2021   Family history of breast cancer 11/21/2021   Family history of brain cancer 11/21/2021   Family history of  bladder cancer 11/21/2021   Malignant neoplasm of upper-outer quadrant of left breast in female, estrogen receptor positive ([place]) 11/19/2021   Bloody discharge from left nipple 01/29/2017   Uncontrolled diabetes mellitus type 2 without complications 44/97/5300   Benign essential hypertension 06/22/2015   Hypercholesterolemia 06/22/2015   Allergic rhinitis due to pollen 06/22/2015   Chest pain 06/22/2015    PCP: Donnie Coffin, MD  REFERRING PROVIDER: Stark Klein, MD  REFERRING DIAG: Left Breast Cancer  THERAPY DIAG:  Malignant neoplasm of upper-outer quadrant of left breast in female, estrogen receptor positive ([place])  Abnormal posture  Nontraumatic hematoma of soft tissue  Rationale for Evaluation and Treatment Rehabilitation  ONSET DATE: 11/12/2021  SUBJECTIVE:  SUBJECTIVE STATEMENT: Pt saw Worthy Flank today and was noted to have fullness in the left axilla that is firm with an opening of the left lateral incision in the axilla with drainage of old blood on dressings. She developed a hematoma a week after surgery. It came on suddenly. They tried to aspirate it 2 times and didn't get anything. Last Thursday it opened a suture and came out in more of a gush. Saw MD on Monday and they did a lot of pressing and pushing trying to get it to come out. I am on antibiotics.  I will see her again on Monday. Some tingling present at posterior arm where she initially had numbness.  PERTINENT HISTORY:  Patient was diagnosed on 11/12/2021 with left grade 2 invasive ductal carcinoma breast cancer. It measures 1.6 cm and is located in the upper outer quadrant. It is ER/PR positive and HER2 negative with a Ki67 of 5%. She is s/p left lumpectomy with SLNB on 12/06/2021 with 0/3 LN. She  does not require chemo, but  will have radiation.She also has diabetes and hypertension I ddi well with my ROM initially, and lost some when the hematoma happened. I have been doing the exercises.    PATIENT GOALS:  Reassess how my recovery is going related to arm function, pain, and swelling.  PAIN:  Are you having pain? No  PRECAUTIONS: Recent Surgery, left UE Lymphedema risk,   ACTIVITY LEVEL / LEISURE: hasn't resumed yet, I'm doing household activities.   OBJECTIVE:   PATIENT SURVEYS:  QUICK DASH: 20%  OBSERVATIONS:  Padded bandage covering area of opening at site of hematoma. Observed by NP today so not removed this afternoon. Bruising or ? Mag trace at left breast medial to areola. ? Swelling at lateral breast but difficult to tell with the bandage that is present.  POSTURE:   Forward head, rounded shoulders     LYMPHEDEMA ASSESSMENT:      UPPER EXTREMITY AROM/PROM:   A/PROM RIGHT   eval     Shoulder extension 46   Shoulder flexion 156   Shoulder abduction 160   Shoulder internal rotation 79   Shoulder external rotation 90                           (Blank rows = not tested A/PROM LEFT   eval LEFT 12/27/2021  Shoulder extension 60 70  Shoulder flexion 160 152  Shoulder abduction 171 147  Shoulder internal rotation 72 70  Shoulder external rotation 90 95                          (Blank rows = not tested)     CERVICAL AROM: All within functional limits   UPPER EXTREMITY STRENGTH: WNL     LYMPHEDEMA ASSESSMENTS:    LANDMARK RIGHT   eval  10 cm proximal to olecranon process 24.2  Olecranon process 22.5  10 cm proximal to ulnar styloid process 18  Just proximal to ulnar styloid process 13.3  Across hand at thumb web space 17.7  At base of 2nd digit 5.6  (Blank rows = not tested)   LANDMARK LEFT   eval LEFT 12/27/2021  10 cm proximal to olecranon process 24.6 24.4  Olecranon process 22.5 22.4  10 cm proximal to ulnar styloid process 17.3 17.3  Just proximal to ulnar  styloid process 13.3 13.3  Across hand at thumb web space 16.8 17.1  At base of 2nd digit 5.5 5.5  (Blank rows = not tested)       Surgery type/Date: 12/06/2021 Left Lumpectomy with SLNB Number of lymph nodes removed: 0/3 Current/past treatment (chemo, radiation, hormone therapy): pending radiation Other symptoms:  Heaviness/tightness Yes Pain No Pitting edema No Infections No Decreased scar mobility not assessed;steri strips present over breast incision, pad over axillary incision Stemmer sign No  12/27/2021 TREATMENT TODAY  Half inch foam pad in TG soft made for axillary region on left, Reviewed 4 post of exercises, discussed POC  PATIENT EDUCATION:  Education details: ABC class, SOZO screens, reviewed post op exs with flexion and stargazer in supine, pad made for bra, POC Person educated: Patient Education method: Explanation, Demonstration, and Handouts Education comprehension: verbalized understanding and returned demonstration   HOME EXERCISE PROGRAM:  Reviewed previously given post op HEP. Pt demonstrated all with flexion and stargazer in supine.  ASSESSMENT:  CLINICAL IMPRESSION: Pt is s/p left lumpectomy with SLNB and 0/3 Ln on 12/06/2021. She developed a left axillary hematoma several weeks after surgery which has been draining. Her radiation is being delayed due to Hematoma.She is compliant with her HEP but is still lacking ROM greatest for abduction. A foam pad was made for axillary region to place under bra to prevent bra from cutting into her skin and to apply pressure to the hematoma area. Her bra was adjusted to the first hook to also apply more pressure to the area of the hematoma. Despite the hematoma she is making very good progress with her ROM. She is not yet ready to perform scar massage to either incision. She will benefit from skilled PT to address ROM and hematoma, and instruct MLD.  Pt will benefit from skilled therapeutic intervention to improve on the  following deficits: Decreased knowledge of precautions, impaired UE functional use, pain, decreased ROM, postural dysfunction.   PT treatment/interventions: ADL/Self care home management, Therapeutic exercises, Therapeutic activity, Patient/Family education, Self Care, Orthotic/Fit training, Manual lymph drainage, scar mobilization, Manual therapy, and Re-evaluation     GOALS: Goals reviewed with patient? Yes  LONG TERM GOALS:  (STG=LTG)  GOALS Name Target Date  Goal status  1 Pt will demonstrate she has regained full shoulder ROM and function post operatively compared to baselines.  Baseline: 01/24/2022 IN PROGRESS  2 Pt will note decreased left axillary hematoma size by 50% 01/24/2022 INITIAL  3 Pt will be independent in MLD to assist with axillary swelling 01/24/2022 INITIAL  4        PLAN: PT FREQUENCY/DURATION: 2x/week x 4 weeks  PLAN FOR NEXT SESSION:  STM as needed for sore muscles, PROM, MLD and instruct pt in MLD to assist with swelling, scar massage when ready   [facility]  [address], [address]  [place] [address]  [phone]  After Breast Cancer Class It is recommended you attend the ABC class to be educated on lymphedema risk reduction. This class is free of charge and lasts for 1 hour. It is a 1-time class. You will need to download the Webex app either on your phone or computer. We will send you a link the night before or the morning of the class. You should be able to click on that link to join the class. This is not a confidential class. You don't have to turn your camera on, but other participants may be able to see your email address.  Scar massage You can begin gentle scar massage to you incision  sites. Gently place one hand on the incision and move the skin (without sliding on the skin) in various directions. Do this for a few minutes and then you can gently massage either coconut oil or vitamin E cream into the scars.  Compression  garment You should continue wearing your compression bra until you feel like you no longer have swelling.  Home exercise Program Continue doing the exercises you were given until you feel like you can do them without feeling any tightness at the end.   Walking Program Studies show that 30 minutes of walking per day (fast enough to elevate your heart rate) can significantly reduce the risk of a cancer recurrence. If you can't walk due to other medical reasons, we encourage you to find another activity you could do (like a stationary bike or water exercise).  Posture After breast cancer surgery, people frequently sit with rounded shoulders posture because it puts their incisions on slack and feels better. If you sit like this and scar tissue forms in that position, you can become very tight and have pain sitting or standing with good posture. Try to be aware of your posture and sit and stand up tall to heal properly.  Follow up PT: It is recommended you return every 3 months for the first 3 years following surgery to be assessed on the SOZO machine for an L-Dex score. This helps prevent clinically significant lymphedema in 95% of patients. These follow up screens are 10 minute appointments that you are not billed for.  Claris Pong, PT 12/27/2021, 2:53 PM

## 2021-12-31 ENCOUNTER — Other Ambulatory Visit: Payer: Self-pay | Admitting: Radiation Oncology

## 2021-12-31 ENCOUNTER — Inpatient Hospital Stay
Admission: RE | Admit: 2021-12-31 | Discharge: 2021-12-31 | Disposition: A | Discharge status: Home / Self Care | Payer: Self-pay | Source: Ambulatory Visit | Attending: Radiation Oncology | Admitting: Radiation Oncology

## 2021-12-31 ENCOUNTER — Telehealth: Payer: Managed Care, Other (non HMO) | Admitting: Hematology and Oncology

## 2021-12-31 ENCOUNTER — Ambulatory Visit
Admission: RE | Admit: 2021-12-31 | Discharge: 2021-12-31 | Disposition: A | Discharge status: Home / Self Care | Payer: Self-pay | Source: Ambulatory Visit | Attending: Radiation Oncology | Admitting: Radiation Oncology

## 2021-12-31 DIAGNOSIS — C50412 Malignant neoplasm of upper-outer quadrant of left female breast: Secondary | ICD-10-CM

## 2022-01-01 ENCOUNTER — Telehealth: Payer: Self-pay | Admitting: Radiation Oncology

## 2022-01-01 ENCOUNTER — Ambulatory Visit: Payer: Managed Care, Other (non HMO) | Attending: General Surgery

## 2022-01-01 ENCOUNTER — Ambulatory Visit: Payer: Managed Care, Other (non HMO)

## 2022-01-01 ENCOUNTER — Encounter (HOSPITAL_COMMUNITY): Payer: Self-pay

## 2022-01-01 DIAGNOSIS — M7981 Nontraumatic hematoma of soft tissue: Secondary | ICD-10-CM

## 2022-01-01 DIAGNOSIS — C50412 Malignant neoplasm of upper-outer quadrant of left female breast: Secondary | ICD-10-CM | POA: Diagnosis not present

## 2022-01-01 DIAGNOSIS — Z170 Estrogen receptor positive status [ER+]: Secondary | ICD-10-CM

## 2022-01-01 DIAGNOSIS — R293 Abnormal posture: Secondary | ICD-10-CM

## 2022-01-01 NOTE — Therapy (Signed)
OUTPATIENT PHYSICAL THERAPY BREAST CANCER TREATMENT   Patient Name: Kari Torres MRN: [id number] DOB:1965/05/08, 56 y.o., female Today's Date: 01/01/2022   PT End of Session - 01/01/22 0902     Visit Number 3    Number of Visits 10    Date for PT Re-Evaluation 01/24/22    PT Start Time 0903    PT Stop Time 0956    PT Time Calculation (min) 53 min    Activity Tolerance Patient tolerated treatment well    Behavior During Therapy [facility] for tasks assessed/performed             Past Medical History:  Diagnosis Date   Allergic rhinitis due to pollen    Bloody discharge from left nipple 01/29/2017   Breast cancer ([place])    Diabetes mellitus without complication ([place])    Family history of breast cancer 11/21/2021   Hypercholesterolemia    Hypertension    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   Past Surgical History:  Procedure Laterality Date   BREAST LUMPECTOMY Left 01/29/2017   Procedure: LEFT BREAST LUMPECTOMY SUBAREOLAR DISSECTION ERAS PATHWAY;  Surgeon: Fanny Skates, MD;  Location: [facility];  Service: General;  Laterality: Left;  LMA   BREAST LUMPECTOMY WITH RADIOACTIVE SEED AND SENTINEL LYMPH NODE BIOPSY Left 12/06/2021   Procedure: LEFT BREAST LUMPECTOMY WITH RADIOACTIVE SEED AND SENTINEL LYMPH NODE BIOPSY;  Surgeon: Stark Klein, MD;  Location: [facility];  Service: General;  Laterality: Left;   BREAST LUMPECTOMY WITH RADIOACTIVE SEED LOCALIZATION Left 09/15/2015   Procedure: LEFT BREAST LUMPECTOMY WITH RADIOACTIVE SEED LOCALIZATION;  Surgeon: Fanny Skates, MD;  Location: [facility];  Service: General;  Laterality: Left;   [place] EXTRACTION     Patient Active Problem List   Diagnosis Date Noted   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   Genetic testing 11/30/2021   Family history of breast cancer 11/21/2021   Family history of brain cancer 11/21/2021   Family history of bladder  cancer 11/21/2021   Malignant neoplasm of upper-outer quadrant of left breast in female, estrogen receptor positive ([place]) 11/19/2021   Bloody discharge from left nipple 01/29/2017   Uncontrolled diabetes mellitus type 2 without complications 88/89/1694   Benign essential hypertension 06/22/2015   Hypercholesterolemia 06/22/2015   Allergic rhinitis due to pollen 06/22/2015   Chest pain 06/22/2015    PCP: Donnie Coffin, MD  REFERRING PROVIDER: Stark Klein, MD  REFERRING DIAG: Left Breast Cancer  THERAPY DIAG:  Malignant neoplasm of upper-outer quadrant of left breast in female, estrogen receptor positive ([place])  Abnormal posture  Nontraumatic hematoma of soft tissue  Rationale for Evaluation and Treatment Rehabilitation  ONSET DATE: 11/12/2021  SUBJECTIVE:  SUBJECTIVE STATEMENT: The foam made a huge difference. Saturday night it started oozing and clots moved out..  It is still draining but it has gone down a lot. Dr. Barry Dienes saw it yesterday and she thinks its about gone and drainage will resolve again in about a week. The skin should heal in about 3 weeks  after it stops draining.she said. I see radiation next week for CT simulation. I feel like ROM is better and the prickly sensation/itching is improved too   PERTINENT HISTORY:  Patient was diagnosed on 11/12/2021 with left grade 2 invasive ductal carcinoma breast cancer. It measures 1.6 cm and is located in the upper outer quadrant. It is ER/PR positive and HER2 negative with a Ki67 of 5%. She is s/p left lumpectomy with SLNB on 12/06/2021 with 0/3 LN. She  does not require chemo, but will have radiation.She also has diabetes and hypertension I ddi well with my ROM initially, and lost some when the hematoma happened. I have been doing the exercises.     PATIENT GOALS:  Reassess how my recovery is going related to arm function, pain, and swelling.  PAIN:  Are you having pain? No  PRECAUTIONS: Recent Surgery, left UE Lymphedema risk,   ACTIVITY LEVEL / LEISURE: hasn't resumed yet, I'm doing household activities.   OBJECTIVE:   PATIENT SURVEYS:  QUICK DASH: 20%  OBSERVATIONS:  Padded bandage covering area of opening at site of hematoma. Observed by NP today so not removed this afternoon. Bruising or ? Mag trace at left breast medial to areola. ? Swelling at lateral breast but difficult to tell with the bandage that is present.  POSTURE:   Forward head, rounded shoulders     LYMPHEDEMA ASSESSMENT:      UPPER EXTREMITY AROM/PROM:   A/PROM RIGHT   eval     Shoulder extension 46   Shoulder flexion 156   Shoulder abduction 160   Shoulder internal rotation 79   Shoulder external rotation 90                           (Blank rows = not tested A/PROM LEFT   eval LEFT 12/27/2021  Shoulder extension 60 70  Shoulder flexion 160 152  Shoulder abduction 171 147  Shoulder internal rotation 72 70  Shoulder external rotation 90 95                          (Blank rows = not tested)     CERVICAL AROM: All within functional limits   UPPER EXTREMITY STRENGTH: WNL     LYMPHEDEMA ASSESSMENTS:    LANDMARK RIGHT   eval  10 cm proximal to olecranon process 24.2  Olecranon process 22.5  10 cm proximal to ulnar styloid process 18  Just proximal to ulnar styloid process 13.3  Across hand at thumb web space 17.7  At base of 2nd digit 5.6  (Blank rows = not tested)   LANDMARK LEFT   eval LEFT 12/27/2021  10 cm proximal to olecranon process 24.6 24.4  Olecranon process 22.5 22.4  10 cm proximal to ulnar styloid process 17.3 17.3  Just proximal to ulnar styloid process 13.3 13.3  Across hand at thumb web space 16.8 17.1  At base of 2nd digit 5.5 5.5  (Blank rows = not tested)       Surgery type/Date: 12/06/2021 Left  Lumpectomy with SLNB Number of lymph nodes removed: 0/3  Current/past treatment (chemo, radiation, hormone therapy): pending radiation Other symptoms:  Heaviness/tightness Yes Pain No Pitting edema No Infections No Decreased scar mobility not assessed;steri strips present over breast incision, pad over axillary incision Stemmer sign No   TREATMENT TODAY  01/01/2022 Pulleys x 1:30 flexion, scaption, abduction with occasional VC's for form. Soft tissue mobilization to left pectorals, UT, lateral trunk with cocoa butter  Supine wand flexion and scaption x 3 PROM left shoulder flexion, scaption, ER abduction Manual lymph drainage: short neck, left inguinal LNS, left axillo-inguinal pathway repeated multiple times  supine and SL and ending with LNS. Pt instructed in and practiced same. Updated HEP with wand exs  12/27/2021  half inch foam pad in TG soft made for axillary region on left, Reviewed 4 post of exercises, discussed POC  PATIENT EDUCATION:  Education details: Wand exercises, MLD Person educated: Patient Education method: Explanation, Demonstration, and Handouts Education comprehension: verbalized understanding and returned demonstration   HOME EXERCISE PROGRAM:  Reviewed previously given post op HEP. Pt demonstrated all with flexion and stargazer in supine.  ASSESSMENT:  CLINICAL IMPRESSION: Pt had good drainage from hematoma area with addition of foam pad, and bra was comfortable. Performed manual techniques for tight muscles and updated HEP with wand exercises. Performed MLD to left axillary region using gloves and instructed pt in same. She used very good pressure and stretch and demonstrates improved shoulder ROM. Incision area still open and approx 1 cm but not presently draining  Pt will benefit from skilled therapeutic intervention to improve on the following deficits: Decreased knowledge of precautions, impaired UE functional use, pain, decreased ROM, postural  dysfunction.   PT treatment/interventions: ADL/Self care home management, Therapeutic exercises, Therapeutic activity, Patient/Family education, Self Care, Orthotic/Fit training, Manual lymph drainage, scar mobilization, Manual therapy, and Re-evaluation     GOALS: Goals reviewed with patient? Yes  LONG TERM GOALS:  (STG=LTG)  GOALS Name Target Date  Goal status  1 Pt will demonstrate she has regained full shoulder ROM and function post operatively compared to baselines.  Baseline: 01/29/2022 IN PROGRESS  2 Pt will note decreased left axillary hematoma size by 50% 01/24/2022 INITIAL  3 Pt will be independent in MLD to assist with axillary swelling 01/29/2022 INITIAL  4        PLAN: PT FREQUENCY/DURATION: 2x/week x 4 weeks  PLAN FOR NEXT SESSION:  LTR, pec stretches,STM as needed for sore muscles, PROM, MLD and instruct pt in MLD to assist with swelling, scar massage when ready   [facility]  [address]  [place] [address]  [phone]  After Breast Cancer Class It is recommended you attend the ABC class to be educated on lymphedema risk reduction. This class is free of charge and lasts for 1 hour. It is a 1-time class. You will need to download the Webex app either on your phone or computer. We will send you a link the night before or the morning of the class. You should be able to click on that link to join the class. This is not a confidential class. You don't have to turn your camera on, but other participants may be able to see your email address.  Scar massage You can begin gentle scar massage to you incision sites. Gently place one hand on the incision and move the skin (without sliding on the skin) in various directions. Do this for a few minutes and then you can gently massage either coconut oil or vitamin E  cream into the scars.  Compression garment You should continue wearing your compression bra until you feel like you no longer  have swelling.  Home exercise Program Continue doing the exercises you were given until you feel like you can do them without feeling any tightness at the end.   Walking Program Studies show that 30 minutes of walking per day (fast enough to elevate your heart rate) can significantly reduce the risk of a cancer recurrence. If you can't walk due to other medical reasons, we encourage you to find another activity you could do (like a stationary bike or water exercise).  Posture After breast cancer surgery, people frequently sit with rounded shoulders posture because it puts their incisions on slack and feels better. If you sit like this and scar tissue forms in that position, you can become very tight and have pain sitting or standing with good posture. Try to be aware of your posture and sit and stand up tall to heal properly.  Follow up PT: It is recommended you return every 3 months for the first 3 years following surgery to be assessed on the SOZO machine for an L-Dex score. This helps prevent clinically significant lymphedema in 95% of patients. These follow up screens are 10 minute appointments that you are not billed for.  Claris Pong, PT 01/01/2022, 10:02 AM

## 2022-01-01 NOTE — Telephone Encounter (Signed)
LM for pt to see how she was doing. Appt is on the calendar for simulation on 01/09/22.

## 2022-01-01 NOTE — Patient Instructions (Signed)
SHOULDER: Flexion - Supine (Cane)        Cancer Rehab [phone]    Hold cane in both hands. Raise arms up overhead. Do not allow back to arch. Hold _5__ seconds. Do __5__ times; __2__ times a day. Hands shoulder width apart 2. Hands in Y position

## 2022-01-04 ENCOUNTER — Ambulatory Visit: Payer: Managed Care, Other (non HMO)

## 2022-01-04 DIAGNOSIS — M7981 Nontraumatic hematoma of soft tissue: Secondary | ICD-10-CM

## 2022-01-04 DIAGNOSIS — Z170 Estrogen receptor positive status [ER+]: Secondary | ICD-10-CM

## 2022-01-04 DIAGNOSIS — R293 Abnormal posture: Secondary | ICD-10-CM

## 2022-01-04 DIAGNOSIS — C50412 Malignant neoplasm of upper-outer quadrant of left female breast: Secondary | ICD-10-CM | POA: Diagnosis not present

## 2022-01-04 NOTE — Therapy (Signed)
OUTPATIENT PHYSICAL THERAPY BREAST CANCER TREATMENT   Patient Name: Kari Torres MRN: [id number] DOB:08-01-1965, 56 y.o., female Today's Date: 01/04/2022   PT End of Session - 01/04/22 1005     Visit Number 4    Number of Visits 10    Date for PT Re-Evaluation 01/24/22    PT Start Time 1005    PT Stop Time 1053    PT Time Calculation (min) 48 min    Activity Tolerance Patient tolerated treatment well    Behavior During Therapy [facility] for tasks assessed/performed             Past Medical History:  Diagnosis Date   Allergic rhinitis due to pollen    Bloody discharge from left nipple 01/29/2017   Breast cancer ([place])    Diabetes mellitus without complication ([place])    Family history of breast cancer 11/21/2021   Hypercholesterolemia    Hypertension    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   Past Surgical History:  Procedure Laterality Date   BREAST LUMPECTOMY Left 01/29/2017   Procedure: LEFT BREAST LUMPECTOMY SUBAREOLAR DISSECTION ERAS PATHWAY;  Surgeon: Fanny Skates, MD;  Location: [place];  Service: General;  Laterality: Left;  LMA   BREAST LUMPECTOMY WITH RADIOACTIVE SEED AND SENTINEL LYMPH NODE BIOPSY Left 12/06/2021   Procedure: LEFT BREAST LUMPECTOMY WITH RADIOACTIVE SEED AND SENTINEL LYMPH NODE BIOPSY;  Surgeon: Stark Klein, MD;  Location: [place];  Service: General;  Laterality: Left;   BREAST LUMPECTOMY WITH RADIOACTIVE SEED LOCALIZATION Left 09/15/2015   Procedure: LEFT BREAST LUMPECTOMY WITH RADIOACTIVE SEED LOCALIZATION;  Surgeon: Fanny Skates, MD;  Location: [place];  Service: General;  Laterality: Left;   [place] EXTRACTION     Patient Active Problem List   Diagnosis Date Noted   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   Genetic testing 11/30/2021   Family history of breast cancer 11/21/2021   Family history of brain cancer 11/21/2021   Family history of bladder  cancer 11/21/2021   Malignant neoplasm of upper-outer quadrant of left breast in female, estrogen receptor positive ([place]) 11/19/2021   Bloody discharge from left nipple 01/29/2017   Uncontrolled diabetes mellitus type 2 without complications 03/75/4360   Benign essential hypertension 06/22/2015   Hypercholesterolemia 06/22/2015   Allergic rhinitis due to pollen 06/22/2015   Chest pain 06/22/2015    PCP: Donnie Coffin, MD  REFERRING PROVIDER: Stark Klein, MD  REFERRING DIAG: Left Breast Cancer  THERAPY DIAG:  Malignant neoplasm of upper-outer quadrant of left breast in female, estrogen receptor positive ([place])  Abnormal posture  Nontraumatic hematoma of soft tissue  Rationale for Evaluation and Treatment Rehabilitation  ONSET DATE: 11/12/2021  SUBJECTIVE:  SUBJECTIVE STATEMENT: Did Fine after last visit. The hematoma is still draining but not as much. I can use 1 gauze now instead of 2. I have the radiation simulation next Wednesday.  PERTINENT HISTORY:  Patient was diagnosed on 11/12/2021 with left grade 2 invasive ductal carcinoma breast cancer. It measures 1.6 cm and is located in the upper outer quadrant. It is ER/PR positive and HER2 negative with a Ki67 of 5%. She is s/p left lumpectomy with SLNB on 12/06/2021 with 0/3 LN. She  does not require chemo, but will have radiation.She also has diabetes and hypertension I ddi well with my ROM initially, and lost some when the hematoma happened. I have been doing the exercises.    PATIENT GOALS:  Reassess how my recovery is going related to arm function, pain, and swelling.  PAIN:  Are you having pain? No  PRECAUTIONS: Recent Surgery, left UE Lymphedema risk,   ACTIVITY LEVEL / LEISURE: hasn't resumed yet, I'm doing household  activities.   OBJECTIVE:   PATIENT SURVEYS:  QUICK DASH: 20%  OBSERVATIONS: Eval: Padded bandage covering area of opening at site of hematoma. Observed by NP today so not removed this afternoon. Bruising or ? Mag trace at left breast medial to areola. ? Swelling at lateral breast but difficult to tell with the bandage that is present.  POSTURE:   Forward head, rounded shoulders     LYMPHEDEMA ASSESSMENT:      UPPER EXTREMITY AROM/PROM:   A/PROM RIGHT   eval     Shoulder extension 46   Shoulder flexion 156   Shoulder abduction 160   Shoulder internal rotation 79   Shoulder external rotation 90                           (Blank rows = not tested A/PROM LEFT   eval LEFT 12/27/2021  Shoulder extension 60 70  Shoulder flexion 160 152  Shoulder abduction 171 147  Shoulder internal rotation 72 70  Shoulder external rotation 90 95                          (Blank rows = not tested)     CERVICAL AROM: All within functional limits   UPPER EXTREMITY STRENGTH: WNL     LYMPHEDEMA ASSESSMENTS:    LANDMARK RIGHT   eval  10 cm proximal to olecranon process 24.2  Olecranon process 22.5  10 cm proximal to ulnar styloid process 18  Just proximal to ulnar styloid process 13.3  Across hand at thumb web space 17.7  At base of 2nd digit 5.6  (Blank rows = not tested)   LANDMARK LEFT   eval LEFT 12/27/2021  10 cm proximal to olecranon process 24.6 24.4  Olecranon process 22.5 22.4  10 cm proximal to ulnar styloid process 17.3 17.3  Just proximal to ulnar styloid process 13.3 13.3  Across hand at thumb web space 16.8 17.1  At base of 2nd digit 5.5 5.5  (Blank rows = not tested)       Surgery type/Date: 12/06/2021 Left Lumpectomy with SLNB Number of lymph nodes removed: 0/3 Current/past treatment (chemo, radiation, hormone therapy): pending radiation Other symptoms:  Heaviness/tightness Yes Pain No Pitting edema No Infections No Decreased scar mobility not  assessed;steri strips present over breast incision, pad over axillary incision Stemmer sign No   TREATMENT TODAY  01/04/2022 Pulleys x 1:30 flexion, scaption, abduction with occasional VC's for  form.Ball rolls x 10 flexion, abduction x 5 Supine AROM flexion, scaption, horizontal abd x 5 Soft tissue mobilization to left pectorals, UT, lateral trunk with cocoa butter  Supine wand flexion and scaption x 3 PROM left shoulder flexion, scaption, ER abduction Manual lymph drainage:performed by Pt.: short neck, left inguinal LNS, left axillo-inguinal pathway repeated multiple times  supine  ending with LNS. Occasional VC's and TC's to use stretch and not slide.  01/01/2022 Pulleys x 1:30 flexion, scaption, abduction with occasional VC's for form. Soft tissue mobilization to left pectorals, UT, lateral trunk with cocoa butter  Supine wand flexion and scaption x 3 PROM left shoulder flexion, scaption, ER abduction Manual lymph drainage: short neck, left inguinal LNS, left axillo-inguinal pathway repeated multiple times  supine and SL and ending with LNS. Pt instructed in and practiced same. Updated HEP with wand exs  12/27/2021  half inch foam pad in TG soft made for axillary region on left, Reviewed 4 post of exercises, discussed POC  PATIENT EDUCATION:  Education details: Wand exercises, MLD Person educated: Patient Education method: Explanation, Demonstration, and Handouts Education comprehension: verbalized understanding and returned demonstration   HOME EXERCISE PROGRAM:  Reviewed previously given post op HEP. Pt demonstrated all with flexion and stargazer in supine.  ASSESSMENT:  CLINICAL IMPRESSION: Pt demonstrates good AROM/PROM of bilateral shoulders. Drainage from Hematoma still occurring but less. Pt demonstrated excellent form with MLD after review with PT.   Pt will benefit from skilled therapeutic intervention to improve on the following deficits: Decreased knowledge of  precautions, impaired UE functional use, pain, decreased ROM, postural dysfunction.   PT treatment/interventions: ADL/Self care home management, Therapeutic exercises, Therapeutic activity, Patient/Family education, Self Care, Orthotic/Fit training, Manual lymph drainage, scar mobilization, Manual therapy, and Re-evaluation     GOALS: Goals reviewed with patient? Yes  LONG TERM GOALS:  (STG=LTG)  GOALS Name Target Date  Goal status  1 Pt will demonstrate she has regained full shoulder ROM and function post operatively compared to baselines.  Baseline: 02/01/2022 IN PROGRESS  2 Pt will note decreased left axillary hematoma size by 50% 01/24/2022 INITIAL  3 Pt will be independent in MLD to assist with axillary swelling 02/01/2022 INITIAL  4        PLAN: PT FREQUENCY/DURATION: 2x/week x 4 weeks  PLAN FOR NEXT SESSION:  LTR, pec stretches,STM as needed for sore muscles, PROM, MLD and instruct pt in MLD to assist with swelling, scar massage when ready   [facility]  [address]  [place] [address]  [phone]  After Breast Cancer Class It is recommended you attend the ABC class to be educated on lymphedema risk reduction. This class is free of charge and lasts for 1 hour. It is a 1-time class. You will need to download the Webex app either on your phone or computer. We will send you a link the night before or the morning of the class. You should be able to click on that link to join the class. This is not a confidential class. You don't have to turn your camera on, but other participants may be able to see your email address.  Scar massage You can begin gentle scar massage to you incision sites. Gently place one hand on the incision and move the skin (without sliding on the skin) in various directions. Do this for a few minutes and then you can gently massage either coconut oil or vitamin E cream into the scars.  Compression  garment You should  continue wearing your compression bra until you feel like you no longer have swelling.  Home exercise Program Continue doing the exercises you were given until you feel like you can do them without feeling any tightness at the end.   Walking Program Studies show that 30 minutes of walking per day (fast enough to elevate your heart rate) can significantly reduce the risk of a cancer recurrence. If you can't walk due to other medical reasons, we encourage you to find another activity you could do (like a stationary bike or water exercise).  Posture After breast cancer surgery, people frequently sit with rounded shoulders posture because it puts their incisions on slack and feels better. If you sit like this and scar tissue forms in that position, you can become very tight and have pain sitting or standing with good posture. Try to be aware of your posture and sit and stand up tall to heal properly.  Follow up PT: It is recommended you return every 3 months for the first 3 years following surgery to be assessed on the SOZO machine for an L-Dex score. This helps prevent clinically significant lymphedema in 95% of patients. These follow up screens are 10 minute appointments that you are not billed for.  Claris Pong, PT 01/04/2022, 12:07 PM

## 2022-01-08 ENCOUNTER — Ambulatory Visit: Payer: Managed Care, Other (non HMO)

## 2022-01-08 DIAGNOSIS — C50412 Malignant neoplasm of upper-outer quadrant of left female breast: Secondary | ICD-10-CM

## 2022-01-08 DIAGNOSIS — M7981 Nontraumatic hematoma of soft tissue: Secondary | ICD-10-CM

## 2022-01-08 DIAGNOSIS — R293 Abnormal posture: Secondary | ICD-10-CM

## 2022-01-08 NOTE — Therapy (Signed)
OUTPATIENT PHYSICAL THERAPY BREAST CANCER TREATMENT   Patient Name: Kari Torres MRN: [id number] DOB:11-22-1965, 56 y.o., female Today's Date: 01/08/2022   PT End of Session - 01/08/22 1206     Visit Number 5    Number of Visits 10    Date for PT Re-Evaluation 01/24/22    PT Start Time 1206    PT Stop Time 1254    PT Time Calculation (min) 48 min    Activity Tolerance Patient tolerated treatment well    Behavior During Therapy [facility] for tasks assessed/performed             Past Medical History:  Diagnosis Date   Allergic rhinitis due to pollen    Bloody discharge from left nipple 01/29/2017   Breast cancer ([place])    Diabetes mellitus without complication ([place])    Family history of breast cancer 11/21/2021   Hypercholesterolemia    Hypertension    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   Past Surgical History:  Procedure Laterality Date   BREAST LUMPECTOMY Left 01/29/2017   Procedure: LEFT BREAST LUMPECTOMY SUBAREOLAR DISSECTION ERAS PATHWAY;  Surgeon: Fanny Skates, MD;  Location: [place];  Service: General;  Laterality: Left;  LMA   BREAST LUMPECTOMY WITH RADIOACTIVE SEED AND SENTINEL LYMPH NODE BIOPSY Left 12/06/2021   Procedure: LEFT BREAST LUMPECTOMY WITH RADIOACTIVE SEED AND SENTINEL LYMPH NODE BIOPSY;  Surgeon: Stark Klein, MD;  Location: [place];  Service: General;  Laterality: Left;   BREAST LUMPECTOMY WITH RADIOACTIVE SEED LOCALIZATION Left 09/15/2015   Procedure: LEFT BREAST LUMPECTOMY WITH RADIOACTIVE SEED LOCALIZATION;  Surgeon: Fanny Skates, MD;  Location: [place];  Service: General;  Laterality: Left;   [place] EXTRACTION     Patient Active Problem List   Diagnosis Date Noted   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   Genetic testing 11/30/2021   Family history of breast cancer 11/21/2021   Family history of brain cancer 11/21/2021   Family history of bladder  cancer 11/21/2021   Malignant neoplasm of upper-outer quadrant of left breast in female, estrogen receptor positive ([place]) 11/19/2021   Bloody discharge from left nipple 01/29/2017   Uncontrolled diabetes mellitus type 2 without complications 55/37/4827   Benign essential hypertension 06/22/2015   Hypercholesterolemia 06/22/2015   Allergic rhinitis due to pollen 06/22/2015   Chest pain 06/22/2015    PCP: Donnie Coffin, MD  REFERRING PROVIDER: Stark Klein, MD  REFERRING DIAG: Left Breast Cancer  THERAPY DIAG:  Malignant neoplasm of upper-outer quadrant of left breast in female, estrogen receptor positive ([place])  Abnormal posture  Nontraumatic hematoma of soft tissue  Rationale for Evaluation and Treatment Rehabilitation  ONSET DATE: 11/12/2021  SUBJECTIVE:  SUBJECTIVE STATEMENT: Did Fine after last visit. I am supposed to have the radiation simulation tomorrow, but its still draining. I may have to reach out to Dr. Barry Dienes.  PERTINENT HISTORY:  Patient was diagnosed on 11/12/2021 with left grade 2 invasive ductal carcinoma breast cancer. It measures 1.6 cm and is located in the upper outer quadrant. It is ER/PR positive and HER2 negative with a Ki67 of 5%. She is s/p left lumpectomy with SLNB on 12/06/2021 with 0/3 LN. She  does not require chemo, but will have radiation.She also has diabetes and hypertension I ddi well with my ROM initially, and lost some when the hematoma happened. I have been doing the exercises.    PATIENT GOALS:  Reassess how my recovery is going related to arm function, pain, and swelling.  PAIN:  Are you having pain? No  PRECAUTIONS: Recent Surgery, left UE Lymphedema risk,   ACTIVITY LEVEL / LEISURE: hasn't resumed yet, I'm doing household activities.   OBJECTIVE:    PATIENT SURVEYS:  QUICK DASH: 20%  OBSERVATIONS: Eval: Padded bandage covering area of opening at site of hematoma. Observed by NP today so not removed this afternoon. Bruising or ? Mag trace at left breast medial to areola. ? Swelling at lateral breast but difficult to tell with the bandage that is present.  POSTURE:   Forward head, rounded shoulders     LYMPHEDEMA ASSESSMENT:      UPPER EXTREMITY AROM/PROM:   A/PROM RIGHT   eval     Shoulder extension 46   Shoulder flexion 156   Shoulder abduction 160   Shoulder internal rotation 79   Shoulder external rotation 90                           (Blank rows = not tested A/PROM LEFT   eval LEFT 12/27/2021 01/08/2022  Shoulder extension 60 70   Shoulder flexion 160 152 165  Shoulder abduction 171 147 177  Shoulder internal rotation 72 70 75  Shoulder external rotation 90 95                           (Blank rows = not tested)     CERVICAL AROM: All within functional limits   UPPER EXTREMITY STRENGTH: WNL     LYMPHEDEMA ASSESSMENTS:    LANDMARK RIGHT   eval  10 cm proximal to olecranon process 24.2  Olecranon process 22.5  10 cm proximal to ulnar styloid process 18  Just proximal to ulnar styloid process 13.3  Across hand at thumb web space 17.7  At base of 2nd digit 5.6  (Blank rows = not tested)   LANDMARK LEFT   eval LEFT 12/27/2021  10 cm proximal to olecranon process 24.6 24.4  Olecranon process 22.5 22.4  10 cm proximal to ulnar styloid process 17.3 17.3  Just proximal to ulnar styloid process 13.3 13.3  Across hand at thumb web space 16.8 17.1  At base of 2nd digit 5.5 5.5  (Blank rows = not tested)       Surgery type/Date: 12/06/2021 Left Lumpectomy with SLNB Number of lymph nodes removed: 0/3 Current/past treatment (chemo, radiation, hormone therapy): pending radiation Other symptoms:  Heaviness/tightness Yes Pain No Pitting edema No Infections No Decreased scar mobility not  assessed;steri strips present over breast incision, pad over axillary incision Stemmer sign No   TREATMENT TODAY  01/08/2022 Pulleys x 1 min flexion, scaption, abduction,  Ball rolls flexion x10, abd x 5 Soft tissue mobilization to left pectorals, UT, lateral trunk with cocoa butter and in SL to left scapular area. Manual lymph drainage:performed by PT and Pt.: short neck, left inguinal LNS, left axillo-inguinal pathway repeated multiple times  supine  ending with LNS and pt performed all steps as well.  Checked left breast incision. Steri strips off. Foam pad made to decrease firmness around areola. Measured ROM and checked goals 01/04/2022 Pulleys x 1:30 flexion, scaption, abduction with occasional VC's for form.Ball rolls x 10 flexion, abduction x 5 Supine AROM flexion, scaption, horizontal abd x 5 Soft tissue mobilization to left pectorals, UT, lateral trunk with cocoa butter  Supine wand flexion and scaption x 3 PROM left shoulder flexion, scaption, ER abduction Manual lymph drainage:performed by Pt.: short neck, left inguinal LNS, left axillo-inguinal pathway repeated multiple times  supine  ending with LNS. Occasional VC's and TC's to use stretch and not slide.  01/01/2022 Pulleys x 1:30 flexion, scaption, abduction with occasional VC's for form. Soft tissue mobilization to left pectorals, UT, lateral trunk with cocoa butter  Supine wand flexion and scaption x 3 PROM left shoulder flexion, scaption, ER abduction Manual lymph drainage: short neck, left inguinal LNS, left axillo-inguinal pathway repeated multiple times  supine and SL and ending with LNS. Pt instructed in and practiced same. Updated HEP with wand exs  12/27/2021  half inch foam pad in TG soft made for axillary region on left, Reviewed 4 post of exercises, discussed POC  PATIENT EDUCATION:  Education details: Wand exercises, MLD Person educated: Patient Education method: Explanation, Demonstration, and  Handouts Education comprehension: verbalized understanding and returned demonstration   HOME EXERCISE PROGRAM:  Reviewed previously given post op HEP. Pt demonstrated all with flexion and stargazer in supine.  ASSESSMENT:  CLINICAL IMPRESSION: Pt has met all goals established. Steri strips now off of breast incision and several firm areas noted around areola. Small spaghetti foam pad made to place in bra. She has restored ROM to WNL, and hematoma has improved greater than 50%. She would benefit from learning MLD to the breast for fibrotic areas since she uses such good technique for the hematoma. She has CT simulation tomorrow.  Pt will benefit from skilled therapeutic intervention to improve on the following deficits: Decreased knowledge of precautions, impaired UE functional use, pain, decreased ROM, postural dysfunction.   PT treatment/interventions: ADL/Self care home management, Therapeutic exercises, Therapeutic activity, Patient/Family education, Self Care, Orthotic/Fit training, Manual lymph drainage, scar mobilization, Manual therapy, and Re-evaluation     GOALS: Goals reviewed with patient? Yes  LONG TERM GOALS:  (STG=LTG)  GOALS Name Target Date  Goal status  1 Pt will demonstrate she has regained full shoulder ROM and function post operatively compared to baselines.  Baseline: 02/05/2022 MET 01/08/2022  2 Pt will note decreased left axillary hematoma size by 50% 01/24/2022 MET 01/08/2022  3 Pt will be independent in MLD to assist with axillary swelling 02/05/2022 MET 01/08/2022  4 Pt will be independent in Left Breast MLD 02/05/2022 NEW     PLAN: PT FREQUENCY/DURATION: 2x/week x 4 weeks  PLAN FOR NEXT SESSION:  LTR, pec stretches,STM as needed for sore muscles, PROM, MLD and instruct pt in MLD to assist with swelling, scar massage when ready, instruct Left Breast MLD, how did CT simulation go, radiation plan?   [facility]  [address]  [address]  [place] [address]  [phone]  After Breast Cancer Class It  is recommended you attend the ABC class to be educated on lymphedema risk reduction. This class is free of charge and lasts for 1 hour. It is a 1-time class. You will need to download the Webex app either on your phone or computer. We will send you a link the night before or the morning of the class. You should be able to click on that link to join the class. This is not a confidential class. You don't have to turn your camera on, but other participants may be able to see your email address.  Scar massage You can begin gentle scar massage to you incision sites. Gently place one hand on the incision and move the skin (without sliding on the skin) in various directions. Do this for a few minutes and then you can gently massage either coconut oil or vitamin E cream into the scars.  Compression garment You should continue wearing your compression bra until you feel like you no longer have swelling.  Home exercise Program Continue doing the exercises you were given until you feel like you can do them without feeling any tightness at the end.   Walking Program Studies show that 30 minutes of walking per day (fast enough to elevate your heart rate) can significantly reduce the risk of a cancer recurrence. If you can't walk due to other medical reasons, we encourage you to find another activity you could do (like a stationary bike or water exercise).  Posture After breast cancer surgery, people frequently sit with rounded shoulders posture because it puts their incisions on slack and feels better. If you sit like this and scar tissue forms in that position, you can become very tight and have pain sitting or standing with good posture. Try to be aware of your posture and sit and stand up tall to heal properly.  Follow up PT: It is recommended you return every 3 months for the first 3 years following surgery to be assessed on  the SOZO machine for an L-Dex score. This helps prevent clinically significant lymphedema in 95% of patients. These follow up screens are 10 minute appointments that you are not billed for.  Claris Pong, PT 01/08/2022, 1:06 PM

## 2022-01-09 ENCOUNTER — Ambulatory Visit
Admission: RE | Admit: 2022-01-09 | Discharge: 2022-01-09 | Disposition: A | Discharge status: Home / Self Care | Payer: Managed Care, Other (non HMO) | Source: Ambulatory Visit | Attending: Radiation Oncology | Admitting: Radiation Oncology

## 2022-01-09 DIAGNOSIS — C50412 Malignant neoplasm of upper-outer quadrant of left female breast: Secondary | ICD-10-CM | POA: Diagnosis present

## 2022-01-09 DIAGNOSIS — Z170 Estrogen receptor positive status [ER+]: Secondary | ICD-10-CM | POA: Insufficient documentation

## 2022-01-10 ENCOUNTER — Ambulatory Visit: Payer: Managed Care, Other (non HMO)

## 2022-01-10 ENCOUNTER — Encounter: Payer: Self-pay | Admitting: *Deleted

## 2022-01-10 DIAGNOSIS — M7981 Nontraumatic hematoma of soft tissue: Secondary | ICD-10-CM

## 2022-01-10 DIAGNOSIS — C50412 Malignant neoplasm of upper-outer quadrant of left female breast: Secondary | ICD-10-CM | POA: Diagnosis not present

## 2022-01-10 DIAGNOSIS — Z170 Estrogen receptor positive status [ER+]: Secondary | ICD-10-CM

## 2022-01-10 DIAGNOSIS — R293 Abnormal posture: Secondary | ICD-10-CM

## 2022-01-10 NOTE — Therapy (Signed)
OUTPATIENT PHYSICAL THERAPY BREAST CANCER TREATMENT   Patient Name: Kari Torres MRN: [id number] DOB:[birth year], 56 y.o., female Today's Date: 01/10/2022   PT End of Session - 01/10/22 0911     Visit Number 6    Number of Visits 10    Date for PT Re-Evaluation 01/24/22    PT Start Time 0911    PT Stop Time 0953    PT Time Calculation (min) 42 min    Activity Tolerance Patient tolerated treatment well    Behavior During Therapy [facility] for tasks assessed/performed              Past Medical History:  Diagnosis Date   Allergic rhinitis due to pollen    Bloody discharge from left nipple 01/29/2017   Breast cancer ([place])    Diabetes mellitus without complication ([place])    Family history of breast cancer 11/21/2021   Hypercholesterolemia    Hypertension    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   Past Surgical History:  Procedure Laterality Date   BREAST LUMPECTOMY Left 01/29/2017   Procedure: LEFT BREAST LUMPECTOMY SUBAREOLAR DISSECTION ERAS PATHWAY;  Surgeon: Fanny Skates, MD;  Location: [place];  Service: General;  Laterality: Left;  LMA   BREAST LUMPECTOMY WITH RADIOACTIVE SEED AND SENTINEL LYMPH NODE BIOPSY Left 12/06/2021   Procedure: LEFT BREAST LUMPECTOMY WITH RADIOACTIVE SEED AND SENTINEL LYMPH NODE BIOPSY;  Surgeon: Stark Klein, MD;  Location: [place];  Service: General;  Laterality: Left;   BREAST LUMPECTOMY WITH RADIOACTIVE SEED LOCALIZATION Left 09/15/2015   Procedure: LEFT BREAST LUMPECTOMY WITH RADIOACTIVE SEED LOCALIZATION;  Surgeon: Fanny Skates, MD;  Location: [place];  Service: General;  Laterality: Left;   [place] EXTRACTION     Patient Active Problem List   Diagnosis Date Noted   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   Genetic testing 11/30/2021   Family history of breast cancer 11/21/2021   Family history of brain cancer 11/21/2021   Family history of  bladder cancer 11/21/2021   Malignant neoplasm of upper-outer quadrant of left breast in female, estrogen receptor positive ([place]) 11/19/2021   Bloody discharge from left nipple 01/29/2017   Uncontrolled diabetes mellitus type 2 without complications 37/29/0211   Benign essential hypertension 06/22/2015   Hypercholesterolemia 06/22/2015   Allergic rhinitis due to pollen 06/22/2015   Chest pain 06/22/2015    PCP: Donnie Coffin, MD  REFERRING PROVIDER: Stark Klein, MD  REFERRING DIAG: Left Breast Cancer  THERAPY DIAG:  Malignant neoplasm of upper-outer quadrant of left breast in female, estrogen receptor positive ([place])  Abnormal posture  Nontraumatic hematoma of soft tissue  Rationale for Evaluation and Treatment Rehabilitation  ONSET DATE: 11/12/2021  SUBJECTIVE:  SUBJECTIVE STATEMENT: They did the radiation simulation but they are pushing it out a few weeks and I have a schedule to start on Oct 31. If it doesn't stop draining I will reach out to Dr. Barry Dienes.  I think the foam is helping with the left breast swelling. I feel like I had less drainage from the hematoma. I had a positive genetic test showing 60% risk so I have some other surgeries in my future.   PERTINENT HISTORY:  Patient was diagnosed on 11/12/2021 with left grade 2 invasive ductal carcinoma breast cancer. It measures 1.6 cm and is located in the upper outer quadrant. It is ER/PR positive and HER2 negative with a Ki67 of 5%. She is s/p left lumpectomy with SLNB on 12/06/2021 with 0/3 LN. She  does not require chemo, but will have radiation.She also has diabetes and hypertension I ddi well with my ROM initially, and lost some when the hematoma happened. I have been doing the exercises.    PATIENT GOALS:  Reassess how my recovery is going  related to arm function, pain, and swelling.  PAIN:  Are you having pain? No  PRECAUTIONS: Recent Surgery, left UE Lymphedema risk,   ACTIVITY LEVEL / LEISURE: hasn't resumed yet, I'm doing household activities.   OBJECTIVE:   PATIENT SURVEYS:  QUICK DASH: 20%  OBSERVATIONS: Eval: Padded bandage covering area of opening at site of hematoma. Observed by NP today so not removed this afternoon. Bruising or ? Mag trace at left breast medial to areola. ? Swelling at lateral breast but difficult to tell with the bandage that is present.  POSTURE:   Forward head, rounded shoulders     LYMPHEDEMA ASSESSMENT:      UPPER EXTREMITY AROM/PROM:   A/PROM RIGHT   eval     Shoulder extension 46   Shoulder flexion 156   Shoulder abduction 160   Shoulder internal rotation 79   Shoulder external rotation 90                           (Blank rows = not tested A/PROM LEFT   eval LEFT 12/27/2021 01/08/2022  Shoulder extension 60 70   Shoulder flexion 160 152 165  Shoulder abduction 171 147 177  Shoulder internal rotation 72 70 75  Shoulder external rotation 90 95                           (Blank rows = not tested)     CERVICAL AROM: All within functional limits   UPPER EXTREMITY STRENGTH: WNL     LYMPHEDEMA ASSESSMENTS:    LANDMARK RIGHT   eval  10 cm proximal to olecranon process 24.2  Olecranon process 22.5  10 cm proximal to ulnar styloid process 18  Just proximal to ulnar styloid process 13.3  Across hand at thumb web space 17.7  At base of 2nd digit 5.6  (Blank rows = not tested)   LANDMARK LEFT   eval LEFT 12/27/2021  10 cm proximal to olecranon process 24.6 24.4  Olecranon process 22.5 22.4  10 cm proximal to ulnar styloid process 17.3 17.3  Just proximal to ulnar styloid process 13.3 13.3  Across hand at thumb web space 16.8 17.1  At base of 2nd digit 5.5 5.5  (Blank rows = not tested)       Surgery type/Date: 12/06/2021 Left Lumpectomy with  SLNB Number of lymph nodes removed: 0/3  Current/past treatment (chemo, radiation, hormone therapy): pending radiation Other symptoms:  Heaviness/tightness Yes Pain No Pitting edema No Infections No Decreased scar mobility not assessed;steri strips present over breast incision, pad over axillary incision Stemmer sign No   TREATMENT TODAY  01/10/2022  short neck,  5 breaths,left inguinal LNS, left axillo-inguinal pathway repeated multiple times, then left lateral breast directing to pathway and   ending with LNS  pt performed all steps very  well. Pt given handout with written and illustrated instructions. Briefly showed how to do scar massage. Applied small amount of cocoa butter to incision at nipple   01/08/2022 Pulleys x 1 min flexion, scaption, abduction, Ball rolls flexion x10, abd x 5 Soft tissue mobilization to left pectorals, UT, lateral trunk with cocoa butter and in SL to left scapular area. Manual lymph drainage:performed by PT and Pt.: short neck, left inguinal LNS, left axillo-inguinal pathway repeated multiple times  supine  ending with LNS and pt performed all steps as well.  Checked left breast incision. Steri strips off. Foam pad made to decrease firmness around areola. Measured ROM and checked goals 01/04/2022 Pulleys x 1:30 flexion, scaption, abduction with occasional VC's for form.Ball rolls x 10 flexion, abduction x 5 Supine AROM flexion, scaption, horizontal abd x 5 Soft tissue mobilization to left pectorals, UT, lateral trunk with cocoa butter  Supine wand flexion and scaption x 3 PROM left shoulder flexion, scaption, ER abduction Manual lymph drainage:performed by Pt.: short neck, left inguinal LNS, left axillo-inguinal pathway repeated multiple times  supine  ending with LNS. Occasional VC's and TC's to use stretch and not slide.  01/01/2022 Pulleys x 1:30 flexion, scaption, abduction with occasional VC's for form. Soft tissue mobilization to left  pectorals, UT, lateral trunk with cocoa butter  Supine wand flexion and scaption x 3 PROM left shoulder flexion, scaption, ER abduction Manual lymph drainage: short neck, left inguinal LNS, left axillo-inguinal pathway repeated multiple times  supine and SL and ending with LNS. Pt instructed in and practiced same. Updated HEP with wand exs  12/27/2021  half inch foam pad in TG soft made for axillary region on left, Reviewed 4 post of exercises, discussed POC  PATIENT EDUCATION:  Education details: Wand exercises, MLD Person educated: Patient Education method: Explanation, Demonstration, and Handouts Education comprehension: verbalized understanding and returned demonstration   HOME EXERCISE PROGRAM:  Reviewed previously given post op HEP. Pt demonstrated all with flexion and stargazer in supine.  ASSESSMENT:  CLINICAL IMPRESSION: Pt did very well including left lateral breast in with her manual lymphatic drainage. Incision at nipple continues with small scab but may be ready for scar massage next week. Will review breast MLD and initiate strength next visit.  Pt will benefit from skilled therapeutic intervention to improve on the following deficits: Decreased knowledge of precautions, impaired UE functional use, pain, decreased ROM, postural dysfunction.   PT treatment/interventions: ADL/Self care home management, Therapeutic exercises, Therapeutic activity, Patient/Family education, Self Care, Orthotic/Fit training, Manual lymph drainage, scar mobilization, Manual therapy, and Re-evaluation     GOALS: Goals reviewed with patient? Yes  LONG TERM GOALS:  (STG=LTG)  GOALS Name Target Date  Goal status  1 Pt will demonstrate she has regained full shoulder ROM and function post operatively compared to baselines.  Baseline: 02/07/2022 MET 01/08/2022  2 Pt will note decreased left axillary hematoma size by 50% 01/24/2022 MET 01/08/2022  3 Pt will be independent in MLD to assist  with axillary swelling 02/07/2022 MET 01/08/2022  4 Pt  will be independent in Left Breast MLD 02/05/2022 NEW     PLAN: PT FREQUENCY/DURATION: 2x/week x 4 weeks  PLAN FOR NEXT SESSION:  LTR, pec stretches,STM as needed for sore muscles, PROM, MLD and instruct pt in MLD to assist with swelling, scar massage when ready, review Left Breast MLD, theraaband   [facility]  [address], [address]  [place] [address]  [phone]  After Breast Cancer Class It is recommended you attend the ABC class to be educated on lymphedema risk reduction. This class is free of charge and lasts for 1 hour. It is a 1-time class. You will need to download the Webex app either on your phone or computer. We will send you a link the night before or the morning of the class. You should be able to click on that link to join the class. This is not a confidential class. You don't have to turn your camera on, but other participants may be able to see your email address.  Scar massage You can begin gentle scar massage to you incision sites. Gently place one hand on the incision and move the skin (without sliding on the skin) in various directions. Do this for a few minutes and then you can gently massage either coconut oil or vitamin E cream into the scars.  Compression garment You should continue wearing your compression bra until you feel like you no longer have swelling.  Home exercise Program Continue doing the exercises you were given until you feel like you can do them without feeling any tightness at the end.   Walking Program Studies show that 30 minutes of walking per day (fast enough to elevate your heart rate) can significantly reduce the risk of a cancer recurrence. If you can't walk due to other medical reasons, we encourage you to find another activity you could do (like a stationary bike or water exercise).  Posture After breast cancer surgery, people frequently sit with  rounded shoulders posture because it puts their incisions on slack and feels better. If you sit like this and scar tissue forms in that position, you can become very tight and have pain sitting or standing with good posture. Try to be aware of your posture and sit and stand up tall to heal properly.  Follow up PT: It is recommended you return every 3 months for the first 3 years following surgery to be assessed on the SOZO machine for an L-Dex score. This helps prevent clinically significant lymphedema in 95% of patients. These follow up screens are 10 minute appointments that you are not billed for.  Claris Pong, PT 01/10/2022, 10:00 AM

## 2022-01-15 ENCOUNTER — Ambulatory Visit: Payer: Managed Care, Other (non HMO)

## 2022-01-15 DIAGNOSIS — R293 Abnormal posture: Secondary | ICD-10-CM

## 2022-01-15 DIAGNOSIS — C50412 Malignant neoplasm of upper-outer quadrant of left female breast: Secondary | ICD-10-CM

## 2022-01-15 DIAGNOSIS — M7981 Nontraumatic hematoma of soft tissue: Secondary | ICD-10-CM

## 2022-01-15 NOTE — Therapy (Signed)
OUTPATIENT PHYSICAL THERAPY BREAST CANCER TREATMENT   Patient Name: Kari Torres MRN: [id number] DOB:Jan 29, 1966, 56 y.o., female Today's Date: 01/15/2022   PT End of Session - 01/15/22 0906     Visit Number 7    Number of Visits 10    Date for PT Re-Evaluation 01/24/22    PT Start Time 0908    PT Stop Time 0955    PT Time Calculation (min) 47 min    Activity Tolerance Patient tolerated treatment well    Behavior During Therapy [facility] for tasks assessed/performed              Past Medical History:  Diagnosis Date   Allergic rhinitis due to pollen    Bloody discharge from left nipple 01/29/2017   Breast cancer ([place])    Diabetes mellitus without complication ([place])    Family history of breast cancer 11/21/2021   Hypercholesterolemia    Hypertension    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   Past Surgical History:  Procedure Laterality Date   BREAST LUMPECTOMY Left 01/29/2017   Procedure: LEFT BREAST LUMPECTOMY SUBAREOLAR DISSECTION ERAS PATHWAY;  Surgeon: Fanny Skates, MD;  Location: [place];  Service: General;  Laterality: Left;  LMA   BREAST LUMPECTOMY WITH RADIOACTIVE SEED AND SENTINEL LYMPH NODE BIOPSY Left 12/06/2021   Procedure: LEFT BREAST LUMPECTOMY WITH RADIOACTIVE SEED AND SENTINEL LYMPH NODE BIOPSY;  Surgeon: Stark Klein, MD;  Location: [place];  Service: General;  Laterality: Left;   BREAST LUMPECTOMY WITH RADIOACTIVE SEED LOCALIZATION Left 09/15/2015   Procedure: LEFT BREAST LUMPECTOMY WITH RADIOACTIVE SEED LOCALIZATION;  Surgeon: Fanny Skates, MD;  Location: [place];  Service: General;  Laterality: Left;   [place] EXTRACTION     Patient Active Problem List   Diagnosis Date Noted   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   Genetic testing 11/30/2021   Family history of breast cancer 11/21/2021   Family history of brain cancer 11/21/2021   Family history of  bladder cancer 11/21/2021   Malignant neoplasm of upper-outer quadrant of left breast in female, estrogen receptor positive ([place]) 11/19/2021   Bloody discharge from left nipple 01/29/2017   Uncontrolled diabetes mellitus type 2 without complications 70/76/1518   Benign essential hypertension 06/22/2015   Hypercholesterolemia 06/22/2015   Allergic rhinitis due to pollen 06/22/2015   Chest pain 06/22/2015    PCP: Donnie Coffin, MD  REFERRING PROVIDER: Stark Klein, MD  REFERRING DIAG: Left Breast Cancer  THERAPY DIAG:  Malignant neoplasm of upper-outer quadrant of left breast in female, estrogen receptor positive ([place])  Abnormal posture  Nontraumatic hematoma of soft tissue  Rationale for Evaluation and Treatment Rehabilitation  ONSET DATE: 11/12/2021  SUBJECTIVE:  SUBJECTIVE STATEMENT: I saw Dr. Barry Dienes yesterday. Friday night I changed the bandage and got up Saturday and there was just a little yellow drainage.  Nothing most of the day. Saturday evening it started draining again.  She put some silver nitrate on the wound and bandaged it yesterday.  I haven't changed the bandage yet. I will see her again on the 30th before radiation. I have been doing the MLD on the breast.   PERTINENT HISTORY:  Patient was diagnosed on 11/12/2021 with left grade 2 invasive ductal carcinoma breast cancer. It measures 1.6 cm and is located in the upper outer quadrant. It is ER/PR positive and HER2 negative with a Ki67 of 5%. She is s/p left lumpectomy with SLNB on 12/06/2021 with 0/3 LN. She  does not require chemo, but will have radiation.She also has diabetes and hypertension I ddi well with my ROM initially, and lost some when the hematoma happened. I have been doing the exercises.    PATIENT GOALS:  Reassess how my  recovery is going related to arm function, pain, and swelling.  PAIN:  Are you having pain? No  PRECAUTIONS: Recent Surgery, left UE Lymphedema risk,   ACTIVITY LEVEL / LEISURE: hasn't resumed yet, I'm doing household activities.   OBJECTIVE:   PATIENT SURVEYS:  QUICK DASH: 20%  OBSERVATIONS: Eval: Padded bandage covering area of opening at site of hematoma. Observed by NP today so not removed this afternoon. Bruising or ? Mag trace at left breast medial to areola. ? Swelling at lateral breast but difficult to tell with the bandage that is present.  POSTURE:   Forward head, rounded shoulders     LYMPHEDEMA ASSESSMENT:      UPPER EXTREMITY AROM/PROM:   A/PROM RIGHT   eval     Shoulder extension 46   Shoulder flexion 156   Shoulder abduction 160   Shoulder internal rotation 79   Shoulder external rotation 90                           (Blank rows = not tested A/PROM LEFT   eval LEFT 12/27/2021 01/08/2022  Shoulder extension 60 70   Shoulder flexion 160 152 165  Shoulder abduction 171 147 177  Shoulder internal rotation 72 70 75  Shoulder external rotation 90 95                           (Blank rows = not tested)     CERVICAL AROM: All within functional limits   UPPER EXTREMITY STRENGTH: WNL     LYMPHEDEMA ASSESSMENTS:    LANDMARK RIGHT   eval  10 cm proximal to olecranon process 24.2  Olecranon process 22.5  10 cm proximal to ulnar styloid process 18  Just proximal to ulnar styloid process 13.3  Across hand at thumb web space 17.7  At base of 2nd digit 5.6  (Blank rows = not tested)   LANDMARK LEFT   eval LEFT 12/27/2021  10 cm proximal to olecranon process 24.6 24.4  Olecranon process 22.5 22.4  10 cm proximal to ulnar styloid process 17.3 17.3  Just proximal to ulnar styloid process 13.3 13.3  Across hand at thumb web space 16.8 17.1  At base of 2nd digit 5.5 5.5  (Blank rows = not tested)       Surgery type/Date: 12/06/2021 Left  Lumpectomy with SLNB Number of lymph nodes removed: 0/3 Current/past treatment (  chemo, radiation, hormone therapy): pending radiation Other symptoms:  Heaviness/tightness Yes Pain No Pitting edema No Infections No Decreased scar mobility not assessed;steri strips present over breast incision, pad over axillary incision Stemmer sign No   TREATMENT TODAY  01/16/2023 Pt performed short neck,  5 breaths,left inguinal LNS, left axillo-inguinal pathway repeated multiple times, then left lateral breast directing to pathway and   ending with LNS  pt performed all steps very  well.  Briefly showed how to do scar massage around incision, and areas of firmness Applied small amount of cocoa butter to incision at nipple Strengthening: Scapular retraction, shoulder extension and bilateral ER with yellow band x 10, Jobes flex and scaption 1# x 10, abd 0# x 10. Pt given illustrated and written instructions Pt feels ready to be released to HEP. Advised to contact us if we can be of further assistance. Advised pt to continue Stretches during radiation and after to be sure she doesn't get tight.  01/10/2022 short neck,  5 breaths,left inguinal LNS, left axillo-inguinal pathway repeated multiple times, then left lateral breast directing to pathway and   ending with LNS  pt performed all steps very  well. Pt given handout with written and illustrated instructions. Briefly showed how to do scar massage. Applied small amount of cocoa butter to incision at nipple 01/08/2022 Pulleys x 1 min flexion, scaption, abduction, Ball rolls flexion x10, abd x 5 Soft tissue mobilization to left pectorals, UT, lateral trunk with cocoa butter and in SL to left scapular area. Manual lymph drainage:performed by PT and Pt.: short neck, left inguinal LNS, left axillo-inguinal pathway repeated multiple times  supine  ending with LNS and pt performed all steps as well.  Checked left breast incision. Steri strips off. Foam pad made  to decrease firmness around areola. Measured ROM and checked goals 01/04/2022 Pulleys x 1:30 flexion, scaption, abduction with occasional VC's for form.Ball rolls x 10 flexion, abduction x 5 Supine AROM flexion, scaption, horizontal abd x 5 Soft tissue mobilization to left pectorals, UT, lateral trunk with cocoa butter  Supine wand flexion and scaption x 3 PROM left shoulder flexion, scaption, ER abduction Manual lymph drainage:performed by Pt.: short neck, left inguinal LNS, left axillo-inguinal pathway repeated multiple times  supine  ending with LNS. Occasional VC's and TC's to use stretch and not slide.  01/01/2022 Pulleys x 1:30 flexion, scaption, abduction with occasional VC's for form. Soft tissue mobilization to left pectorals, UT, lateral trunk with cocoa butter  Supine wand flexion and scaption x 3 PROM left shoulder flexion, scaption, ER abduction Manual lymph drainage: short neck, left inguinal LNS, left axillo-inguinal pathway repeated multiple times  supine and SL and ending with LNS. Pt instructed in and practiced same. Updated HEP with wand exs  12/27/2021  half inch foam pad in TG soft made for axillary region on left, Reviewed 4 post of exercises, discussed POC  PATIENT EDUCATION:  Education details: Wand exercises, MLD,UE theraband/jobes Person educated: Patient Education method: Explanation, Demonstration, and Handouts Education comprehension: verbalized understanding and returned demonstration   HOME EXERCISE PROGRAM:  Reviewed previously given post op HEP. Pt demonstrated all with flexion and stargazer in supine.  ASSESSMENT:  CLINICAL IMPRESSION: Pt is ding very well overall. She is compliant with compression bra, MLD and exercises. She has achieved all goals established. She is pending radiation 01/22/2022 as long as her hematoma is healed. She will follow up with Dr. Barry Dienes about that as it is still draining. She is able to return  demonstrate her home  program. 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 She feels ready to be discharged.  Pt will benefit from skilled therapeutic intervention to improve on the following deficits: Decreased knowledge of precautions, impaired UE functional use, pain, decreased ROM, postural dysfunction.   PT treatment/interventions: ADL/Self care home management, Therapeutic exercises, Therapeutic activity, Patient/Family education, Self Care, Orthotic/Fit training, Manual lymph drainage, scar mobilization, Manual therapy, and Re-evaluation     GOALS: Goals reviewed with patient? Yes  LONG TERM GOALS:  (STG=LTG)  GOALS Name Target Date  Goal status  1 Pt will demonstrate she has regained full shoulder ROM and function post operatively compared to baselines.  Baseline: 02/12/2022 MET 01/08/2022  2 Pt will note decreased left axillary hematoma size by 50% 01/24/2022 MET 01/08/2022  3 Pt will be independent in MLD to assist with axillary swelling 02/12/2022 MET 01/08/2022  4 Pt will be independent in Left Breast MLD 02/05/2022 MET 01/15/2022     PLAN: PT FREQUENCY/DURATION: 2x/week x 4 weeks  PLAN FOR NEXT SESSION:  Pt discharged to HEP. She will contact us if she has questions or concerns. PHYSICAL THERAPY DISCHARGE SUMMARY  Visits from Start of Care: 7  Current functional level related to goals / functional outcomes: Achieved goals   Remaining deficits: Pt still has some drainage from hematoma, and fibrosis in breast   Education / Equipment: Theraband, compression bra   Patient agrees to discharge. Patient goals were met. Patient is being discharged due to meeting the stated rehab goals.  [facility]  [address], [address]  [place] [address]  [phone]  After Breast Cancer Class It is recommended you attend the ABC class to be educated on  lymphedema risk reduction. This class is free of charge and lasts for 1 hour. It is a 1-time class. You will need to download the Webex app either on your phone or computer. We will send you a link the night before or the morning of the class. You should be able to click on that link to join the class. This is not a confidential class. You don't have to turn your camera on, but other participants may be able to see your email address.  Scar massage You can begin gentle scar massage to you incision sites. Gently place one hand on the incision and move the skin (without sliding on the skin) in various directions. Do this for a few minutes and then you can gently massage either coconut oil or vitamin E cream into the scars.  Compression garment You should continue wearing your compression bra until you feel like you no longer have swelling.  Home exercise Program Continue doing the exercises you were given until you feel like you can do them without feeling any tightness at the end.   Walking Program Studies show that 30 minutes of walking per day (fast enough to elevate your heart rate) can significantly reduce the risk of a cancer recurrence. If you can't walk due to other medical reasons, we encourage you to find another activity you could do (like a stationary bike or water exercise).  Posture After breast cancer surgery, people frequently sit with rounded shoulders posture because it puts their incisions on slack and feels better. If you sit like this and scar tissue forms in that position, you can become very tight and  have pain sitting or standing with good posture. Try to be aware of your posture and sit and stand up tall to heal properly.  Follow up PT: It is recommended you return every 3 months for the first 3 years following surgery to be assessed on the SOZO machine for an L-Dex score. This helps prevent clinically significant lymphedema in 95% of patients. These follow up screens are 10  minute appointments that you are not billed for.  Claris Pong, PT 01/15/2022, 1:02 PM

## 2022-01-17 ENCOUNTER — Ambulatory Visit: Payer: Managed Care, Other (non HMO) | Admitting: Radiation Oncology

## 2022-01-17 ENCOUNTER — Ambulatory Visit: Payer: Managed Care, Other (non HMO)

## 2022-01-18 ENCOUNTER — Ambulatory Visit: Payer: Managed Care, Other (non HMO)

## 2022-01-21 ENCOUNTER — Ambulatory Visit: Payer: Managed Care, Other (non HMO)

## 2022-01-22 ENCOUNTER — Other Ambulatory Visit: Payer: Self-pay

## 2022-01-22 ENCOUNTER — Encounter: Payer: Self-pay | Admitting: Radiation Oncology

## 2022-01-22 ENCOUNTER — Ambulatory Visit
Admission: RE | Admit: 2022-01-22 | Payer: Managed Care, Other (non HMO) | Source: Ambulatory Visit | Admitting: Radiation Oncology

## 2022-01-22 NOTE — Progress Notes (Signed)
The patient was seen at the treatment machine and is still healing in her axillary incision site. Dr. Lisbeth Renshaw recommended delaying start another week or so. We will follow up with her next week to see if this has healed and if not delay start of radiation as needed.

## 2022-01-23 ENCOUNTER — Ambulatory Visit: Payer: Managed Care, Other (non HMO)

## 2022-01-24 ENCOUNTER — Ambulatory Visit: Payer: Managed Care, Other (non HMO)

## 2022-01-25 ENCOUNTER — Ambulatory Visit: Payer: Managed Care, Other (non HMO)

## 2022-01-28 ENCOUNTER — Ambulatory Visit: Payer: Managed Care, Other (non HMO)

## 2022-01-29 ENCOUNTER — Telehealth: Payer: Self-pay | Admitting: Radiation Oncology

## 2022-01-29 ENCOUNTER — Ambulatory Visit: Payer: Managed Care, Other (non HMO)

## 2022-01-29 NOTE — Telephone Encounter (Signed)
I spoke with the patient and she recently lost a close friend unexpectedly. She feels like she's still healing slightly but the drainage from her left axilla has lessened since her last visit. She requests a skin check tomorrow in person and we will see her around 10:30 am

## 2022-01-30 ENCOUNTER — Ambulatory Visit: Payer: Managed Care, Other (non HMO)

## 2022-01-31 ENCOUNTER — Ambulatory Visit: Payer: Managed Care, Other (non HMO)

## 2022-02-01 ENCOUNTER — Ambulatory Visit: Payer: Managed Care, Other (non HMO) | Admitting: Radiation Oncology

## 2022-02-01 ENCOUNTER — Ambulatory Visit: Payer: Managed Care, Other (non HMO)

## 2022-02-04 ENCOUNTER — Ambulatory Visit: Payer: Managed Care, Other (non HMO)

## 2022-02-05 ENCOUNTER — Other Ambulatory Visit: Payer: Self-pay

## 2022-02-05 ENCOUNTER — Ambulatory Visit
Admission: RE | Admit: 2022-02-05 | Discharge: 2022-02-05 | Disposition: A | Discharge status: Home / Self Care | Payer: Managed Care, Other (non HMO) | Source: Ambulatory Visit | Attending: Radiation Oncology | Admitting: Radiation Oncology

## 2022-02-05 DIAGNOSIS — C50412 Malignant neoplasm of upper-outer quadrant of left female breast: Secondary | ICD-10-CM | POA: Insufficient documentation

## 2022-02-05 DIAGNOSIS — Z170 Estrogen receptor positive status [ER+]: Secondary | ICD-10-CM | POA: Insufficient documentation

## 2022-02-05 LAB — RAD ONC ARIA SESSION SUMMARY
Course Elapsed Days: 0
Plan Fractions Treated to Date: 1
Plan Prescribed Dose Per Fraction: 2.66 Gy
Plan Total Fractions Prescribed: 16
Plan Total Prescribed Dose: 42.56 Gy
Reference Point Dosage Given to Date: 2.66 Gy
Reference Point Session Dosage Given: 2.66 Gy
Session Number: 1

## 2022-02-06 ENCOUNTER — Other Ambulatory Visit: Payer: Self-pay

## 2022-02-06 ENCOUNTER — Ambulatory Visit
Admission: RE | Admit: 2022-02-06 | Discharge: 2022-02-06 | Disposition: A | Discharge status: Home / Self Care | Payer: Managed Care, Other (non HMO) | Source: Ambulatory Visit | Attending: Radiation Oncology | Admitting: Radiation Oncology

## 2022-02-06 DIAGNOSIS — C50412 Malignant neoplasm of upper-outer quadrant of left female breast: Secondary | ICD-10-CM | POA: Diagnosis not present

## 2022-02-06 LAB — RAD ONC ARIA SESSION SUMMARY
Course Elapsed Days: 1
Plan Fractions Treated to Date: 2
Plan Prescribed Dose Per Fraction: 2.66 Gy
Plan Total Fractions Prescribed: 16
Plan Total Prescribed Dose: 42.56 Gy
Reference Point Dosage Given to Date: 5.32 Gy
Reference Point Session Dosage Given: 2.66 Gy
Session Number: 2

## 2022-02-07 ENCOUNTER — Ambulatory Visit
Admission: RE | Admit: 2022-02-07 | Discharge: 2022-02-07 | Disposition: A | Discharge status: Home / Self Care | Payer: Managed Care, Other (non HMO) | Source: Ambulatory Visit | Attending: Radiation Oncology | Admitting: Radiation Oncology

## 2022-02-07 ENCOUNTER — Ambulatory Visit: Payer: Managed Care, Other (non HMO)

## 2022-02-07 ENCOUNTER — Other Ambulatory Visit: Payer: Self-pay

## 2022-02-07 DIAGNOSIS — C50412 Malignant neoplasm of upper-outer quadrant of left female breast: Secondary | ICD-10-CM | POA: Diagnosis not present

## 2022-02-07 LAB — RAD ONC ARIA SESSION SUMMARY
Course Elapsed Days: 2
Plan Fractions Treated to Date: 3
Plan Prescribed Dose Per Fraction: 2.66 Gy
Plan Total Fractions Prescribed: 16
Plan Total Prescribed Dose: 42.56 Gy
Reference Point Dosage Given to Date: 7.98 Gy
Reference Point Session Dosage Given: 2.66 Gy
Session Number: 3

## 2022-02-08 ENCOUNTER — Other Ambulatory Visit: Payer: Self-pay

## 2022-02-08 ENCOUNTER — Ambulatory Visit: Payer: Managed Care, Other (non HMO) | Admitting: Hematology and Oncology

## 2022-02-08 ENCOUNTER — Ambulatory Visit
Admission: RE | Admit: 2022-02-08 | Discharge: 2022-02-08 | Disposition: A | Discharge status: Home / Self Care | Payer: Managed Care, Other (non HMO) | Source: Ambulatory Visit | Attending: Radiation Oncology | Admitting: Radiation Oncology

## 2022-02-08 ENCOUNTER — Ambulatory Visit: Payer: Managed Care, Other (non HMO) | Admitting: Radiation Oncology

## 2022-02-08 ENCOUNTER — Ambulatory Visit
Admission: RE | Admit: 2022-02-08 | Discharge: 2022-02-08 | Disposition: A | Discharge status: Home / Self Care | Payer: Managed Care, Other (non HMO) | Source: Ambulatory Visit | Attending: Radiation Oncology

## 2022-02-08 DIAGNOSIS — Z170 Estrogen receptor positive status [ER+]: Secondary | ICD-10-CM

## 2022-02-08 DIAGNOSIS — C50412 Malignant neoplasm of upper-outer quadrant of left female breast: Secondary | ICD-10-CM | POA: Diagnosis not present

## 2022-02-08 LAB — RAD ONC ARIA SESSION SUMMARY
Course Elapsed Days: 3
Plan Fractions Treated to Date: 4
Plan Prescribed Dose Per Fraction: 2.66 Gy
Plan Total Fractions Prescribed: 16
Plan Total Prescribed Dose: 42.56 Gy
Reference Point Dosage Given to Date: 10.64 Gy
Reference Point Session Dosage Given: 2.66 Gy
Session Number: 4

## 2022-02-08 MED ORDER — RADIAPLEXRX EX GEL: Freq: Once | CUTANEOUS | Status: AC

## 2022-02-10 ENCOUNTER — Ambulatory Visit: Payer: Managed Care, Other (non HMO)

## 2022-02-10 DIAGNOSIS — C50412 Malignant neoplasm of upper-outer quadrant of left female breast: Secondary | ICD-10-CM | POA: Diagnosis not present

## 2022-02-11 ENCOUNTER — Other Ambulatory Visit: Payer: Self-pay

## 2022-02-11 ENCOUNTER — Ambulatory Visit: Payer: Managed Care, Other (non HMO)

## 2022-02-11 ENCOUNTER — Ambulatory Visit
Admission: RE | Admit: 2022-02-11 | Discharge: 2022-02-11 | Disposition: A | Discharge status: Home / Self Care | Payer: Managed Care, Other (non HMO) | Source: Ambulatory Visit | Attending: Radiation Oncology

## 2022-02-11 DIAGNOSIS — C50412 Malignant neoplasm of upper-outer quadrant of left female breast: Secondary | ICD-10-CM | POA: Diagnosis not present

## 2022-02-11 LAB — RAD ONC ARIA SESSION SUMMARY
Course Elapsed Days: 6
Plan Fractions Treated to Date: 5
Plan Prescribed Dose Per Fraction: 2.66 Gy
Plan Total Fractions Prescribed: 16
Plan Total Prescribed Dose: 42.56 Gy
Reference Point Dosage Given to Date: 13.3 Gy
Reference Point Session Dosage Given: 2.66 Gy
Session Number: 5

## 2022-02-12 ENCOUNTER — Ambulatory Visit
Admission: RE | Admit: 2022-02-12 | Discharge: 2022-02-12 | Disposition: A | Discharge status: Home / Self Care | Payer: Managed Care, Other (non HMO) | Source: Ambulatory Visit | Attending: Radiation Oncology | Admitting: Radiation Oncology

## 2022-02-12 ENCOUNTER — Ambulatory Visit: Payer: Managed Care, Other (non HMO)

## 2022-02-12 ENCOUNTER — Other Ambulatory Visit: Payer: Self-pay

## 2022-02-12 DIAGNOSIS — C50412 Malignant neoplasm of upper-outer quadrant of left female breast: Secondary | ICD-10-CM | POA: Diagnosis not present

## 2022-02-12 LAB — RAD ONC ARIA SESSION SUMMARY
Course Elapsed Days: 7
Plan Fractions Treated to Date: 6
Plan Prescribed Dose Per Fraction: 2.66 Gy
Plan Total Fractions Prescribed: 16
Plan Total Prescribed Dose: 42.56 Gy
Reference Point Dosage Given to Date: 15.96 Gy
Reference Point Session Dosage Given: 2.66 Gy
Session Number: 6

## 2022-02-13 ENCOUNTER — Ambulatory Visit: Payer: Managed Care, Other (non HMO)

## 2022-02-13 ENCOUNTER — Ambulatory Visit
Admission: RE | Admit: 2022-02-13 | Discharge: 2022-02-13 | Discharge status: Home / Self Care | Payer: Managed Care, Other (non HMO) | Source: Ambulatory Visit | Attending: Radiation Oncology

## 2022-02-13 ENCOUNTER — Other Ambulatory Visit: Payer: Self-pay

## 2022-02-13 ENCOUNTER — Ambulatory Visit
Admission: RE | Admit: 2022-02-13 | Discharge: 2022-02-13 | Disposition: A | Discharge status: Home / Self Care | Payer: Managed Care, Other (non HMO) | Source: Ambulatory Visit | Attending: Radiation Oncology | Admitting: Radiation Oncology

## 2022-02-13 DIAGNOSIS — C50412 Malignant neoplasm of upper-outer quadrant of left female breast: Secondary | ICD-10-CM | POA: Diagnosis not present

## 2022-02-13 LAB — RAD ONC ARIA SESSION SUMMARY
Course Elapsed Days: 8
Plan Fractions Treated to Date: 7
Plan Prescribed Dose Per Fraction: 2.66 Gy
Plan Total Fractions Prescribed: 16
Plan Total Prescribed Dose: 42.56 Gy
Reference Point Dosage Given to Date: 18.62 Gy
Reference Point Session Dosage Given: 2.66 Gy
Session Number: 7

## 2022-02-14 ENCOUNTER — Ambulatory Visit: Payer: Managed Care, Other (non HMO)

## 2022-02-15 ENCOUNTER — Ambulatory Visit: Payer: Managed Care, Other (non HMO)

## 2022-02-18 ENCOUNTER — Ambulatory Visit
Admission: RE | Admit: 2022-02-18 | Discharge: 2022-02-18 | Disposition: A | Discharge status: Home / Self Care | Payer: Managed Care, Other (non HMO) | Source: Ambulatory Visit | Attending: Radiation Oncology

## 2022-02-18 ENCOUNTER — Other Ambulatory Visit: Payer: Self-pay

## 2022-02-18 ENCOUNTER — Ambulatory Visit: Payer: Managed Care, Other (non HMO)

## 2022-02-18 DIAGNOSIS — C50412 Malignant neoplasm of upper-outer quadrant of left female breast: Secondary | ICD-10-CM | POA: Diagnosis not present

## 2022-02-18 LAB — RAD ONC ARIA SESSION SUMMARY
Course Elapsed Days: 13
Plan Fractions Treated to Date: 8
Plan Prescribed Dose Per Fraction: 2.66 Gy
Plan Total Fractions Prescribed: 16
Plan Total Prescribed Dose: 42.56 Gy
Reference Point Dosage Given to Date: 21.28 Gy
Reference Point Session Dosage Given: 2.66 Gy
Session Number: 8

## 2022-02-19 ENCOUNTER — Ambulatory Visit
Admission: RE | Admit: 2022-02-19 | Discharge: 2022-02-19 | Disposition: A | Discharge status: Home / Self Care | Payer: Managed Care, Other (non HMO) | Source: Ambulatory Visit | Attending: Radiation Oncology | Admitting: Radiation Oncology

## 2022-02-19 ENCOUNTER — Other Ambulatory Visit: Payer: Self-pay

## 2022-02-19 ENCOUNTER — Ambulatory Visit: Payer: Managed Care, Other (non HMO)

## 2022-02-19 DIAGNOSIS — C50412 Malignant neoplasm of upper-outer quadrant of left female breast: Secondary | ICD-10-CM | POA: Diagnosis not present

## 2022-02-19 LAB — RAD ONC ARIA SESSION SUMMARY
Course Elapsed Days: 14
Plan Fractions Treated to Date: 9
Plan Prescribed Dose Per Fraction: 2.66 Gy
Plan Total Fractions Prescribed: 16
Plan Total Prescribed Dose: 42.56 Gy
Reference Point Dosage Given to Date: 23.94 Gy
Reference Point Session Dosage Given: 2.66 Gy
Session Number: 9

## 2022-02-20 ENCOUNTER — Encounter: Payer: Self-pay | Admitting: *Deleted

## 2022-02-20 ENCOUNTER — Ambulatory Visit
Admission: RE | Admit: 2022-02-20 | Discharge: 2022-02-20 | Disposition: A | Discharge status: Home / Self Care | Payer: Managed Care, Other (non HMO) | Source: Ambulatory Visit | Attending: Radiation Oncology

## 2022-02-20 ENCOUNTER — Ambulatory Visit: Payer: Managed Care, Other (non HMO)

## 2022-02-20 ENCOUNTER — Other Ambulatory Visit: Payer: Self-pay

## 2022-02-20 DIAGNOSIS — C50412 Malignant neoplasm of upper-outer quadrant of left female breast: Secondary | ICD-10-CM

## 2022-02-20 LAB — RAD ONC ARIA SESSION SUMMARY
Course Elapsed Days: 15
Plan Fractions Treated to Date: 10
Plan Prescribed Dose Per Fraction: 2.66 Gy
Plan Total Fractions Prescribed: 16
Plan Total Prescribed Dose: 42.56 Gy
Reference Point Dosage Given to Date: 26.6 Gy
Reference Point Session Dosage Given: 2.66 Gy
Session Number: 10

## 2022-02-21 ENCOUNTER — Ambulatory Visit: Payer: Managed Care, Other (non HMO)

## 2022-02-21 ENCOUNTER — Other Ambulatory Visit: Payer: Self-pay

## 2022-02-21 ENCOUNTER — Ambulatory Visit
Admission: RE | Admit: 2022-02-21 | Discharge: 2022-02-21 | Disposition: A | Discharge status: Home / Self Care | Payer: Managed Care, Other (non HMO) | Source: Ambulatory Visit | Attending: Radiation Oncology | Admitting: Radiation Oncology

## 2022-02-21 DIAGNOSIS — C50412 Malignant neoplasm of upper-outer quadrant of left female breast: Secondary | ICD-10-CM | POA: Diagnosis not present

## 2022-02-21 LAB — RAD ONC ARIA SESSION SUMMARY
Course Elapsed Days: 16
Plan Fractions Treated to Date: 11
Plan Prescribed Dose Per Fraction: 2.66 Gy
Plan Total Fractions Prescribed: 16
Plan Total Prescribed Dose: 42.56 Gy
Reference Point Dosage Given to Date: 29.26 Gy
Reference Point Session Dosage Given: 2.66 Gy
Session Number: 11

## 2022-02-22 ENCOUNTER — Ambulatory Visit
Admission: RE | Admit: 2022-02-22 | Discharge: 2022-02-22 | Disposition: A | Discharge status: Home / Self Care | Payer: Managed Care, Other (non HMO) | Source: Ambulatory Visit | Attending: Radiation Oncology

## 2022-02-22 ENCOUNTER — Other Ambulatory Visit: Payer: Self-pay

## 2022-02-22 ENCOUNTER — Ambulatory Visit: Payer: Managed Care, Other (non HMO) | Admitting: Radiation Oncology

## 2022-02-22 ENCOUNTER — Ambulatory Visit
Admission: RE | Admit: 2022-02-22 | Discharge: 2022-02-22 | Disposition: A | Discharge status: Home / Self Care | Payer: Managed Care, Other (non HMO) | Source: Ambulatory Visit | Attending: Radiation Oncology | Admitting: Radiation Oncology

## 2022-02-22 DIAGNOSIS — Z833 Family history of diabetes mellitus: Secondary | ICD-10-CM | POA: Diagnosis not present

## 2022-02-22 DIAGNOSIS — Z8052 Family history of malignant neoplasm of bladder: Secondary | ICD-10-CM | POA: Diagnosis not present

## 2022-02-22 DIAGNOSIS — Z8249 Family history of ischemic heart disease and other diseases of the circulatory system: Secondary | ICD-10-CM | POA: Diagnosis not present

## 2022-02-22 DIAGNOSIS — Z888 Allergy status to other drugs, medicaments and biological substances status: Secondary | ICD-10-CM | POA: Diagnosis not present

## 2022-02-22 DIAGNOSIS — Z79811 Long term (current) use of aromatase inhibitors: Secondary | ICD-10-CM | POA: Diagnosis not present

## 2022-02-22 DIAGNOSIS — C50412 Malignant neoplasm of upper-outer quadrant of left female breast: Secondary | ICD-10-CM | POA: Insufficient documentation

## 2022-02-22 DIAGNOSIS — Z148 Genetic carrier of other disease: Secondary | ICD-10-CM | POA: Diagnosis not present

## 2022-02-22 DIAGNOSIS — N6489 Other specified disorders of breast: Secondary | ICD-10-CM | POA: Diagnosis not present

## 2022-02-22 DIAGNOSIS — Z79899 Other long term (current) drug therapy: Secondary | ICD-10-CM | POA: Diagnosis not present

## 2022-02-22 DIAGNOSIS — Z803 Family history of malignant neoplasm of breast: Secondary | ICD-10-CM | POA: Diagnosis not present

## 2022-02-22 DIAGNOSIS — Z923 Personal history of irradiation: Secondary | ICD-10-CM | POA: Diagnosis not present

## 2022-02-22 DIAGNOSIS — N858 Other specified noninflammatory disorders of uterus: Secondary | ICD-10-CM | POA: Diagnosis not present

## 2022-02-22 DIAGNOSIS — Z91041 Radiographic dye allergy status: Secondary | ICD-10-CM | POA: Diagnosis not present

## 2022-02-22 DIAGNOSIS — Z808 Family history of malignant neoplasm of other organs or systems: Secondary | ICD-10-CM | POA: Diagnosis not present

## 2022-02-22 DIAGNOSIS — Z170 Estrogen receptor positive status [ER+]: Secondary | ICD-10-CM | POA: Insufficient documentation

## 2022-02-22 DIAGNOSIS — Z809 Family history of malignant neoplasm, unspecified: Secondary | ICD-10-CM | POA: Diagnosis not present

## 2022-02-22 DIAGNOSIS — Z1502 Genetic susceptibility to malignant neoplasm of ovary: Secondary | ICD-10-CM | POA: Diagnosis not present

## 2022-02-22 DIAGNOSIS — Z510 Encounter for antineoplastic radiation therapy: Secondary | ICD-10-CM | POA: Diagnosis present

## 2022-02-22 LAB — RAD ONC ARIA SESSION SUMMARY
Course Elapsed Days: 17
Plan Fractions Treated to Date: 12
Plan Prescribed Dose Per Fraction: 2.66 Gy
Plan Total Fractions Prescribed: 16
Plan Total Prescribed Dose: 42.56 Gy
Reference Point Dosage Given to Date: 31.92 Gy
Reference Point Session Dosage Given: 2.66 Gy
Session Number: 12

## 2022-02-25 ENCOUNTER — Encounter: Payer: Self-pay | Admitting: Hematology and Oncology

## 2022-02-25 ENCOUNTER — Other Ambulatory Visit: Payer: Self-pay

## 2022-02-25 ENCOUNTER — Telehealth: Payer: Self-pay | Admitting: *Deleted

## 2022-02-25 ENCOUNTER — Ambulatory Visit
Admission: RE | Admit: 2022-02-25 | Discharge: 2022-02-25 | Disposition: A | Discharge status: Home / Self Care | Payer: Managed Care, Other (non HMO) | Source: Ambulatory Visit | Attending: Radiation Oncology

## 2022-02-25 ENCOUNTER — Inpatient Hospital Stay: Payer: Managed Care, Other (non HMO) | Attending: Hematology and Oncology | Admitting: Hematology and Oncology

## 2022-02-25 VITALS — BP 154/85 | HR 70 | Temp 97.5°F | Resp 14 | Ht 66.0 in | Wt 141.3 lb

## 2022-02-25 DIAGNOSIS — Z808 Family history of malignant neoplasm of other organs or systems: Secondary | ICD-10-CM | POA: Insufficient documentation

## 2022-02-25 DIAGNOSIS — Z803 Family history of malignant neoplasm of breast: Secondary | ICD-10-CM | POA: Insufficient documentation

## 2022-02-25 DIAGNOSIS — Z148 Genetic carrier of other disease: Secondary | ICD-10-CM | POA: Insufficient documentation

## 2022-02-25 DIAGNOSIS — Z8052 Family history of malignant neoplasm of bladder: Secondary | ICD-10-CM | POA: Insufficient documentation

## 2022-02-25 DIAGNOSIS — Z79899 Other long term (current) drug therapy: Secondary | ICD-10-CM | POA: Insufficient documentation

## 2022-02-25 DIAGNOSIS — C50412 Malignant neoplasm of upper-outer quadrant of left female breast: Secondary | ICD-10-CM | POA: Diagnosis not present

## 2022-02-25 DIAGNOSIS — Z170 Estrogen receptor positive status [ER+]: Secondary | ICD-10-CM | POA: Insufficient documentation

## 2022-02-25 DIAGNOSIS — Z923 Personal history of irradiation: Secondary | ICD-10-CM | POA: Insufficient documentation

## 2022-02-25 DIAGNOSIS — Z888 Allergy status to other drugs, medicaments and biological substances status: Secondary | ICD-10-CM | POA: Insufficient documentation

## 2022-02-25 DIAGNOSIS — Z510 Encounter for antineoplastic radiation therapy: Secondary | ICD-10-CM | POA: Insufficient documentation

## 2022-02-25 DIAGNOSIS — Z79811 Long term (current) use of aromatase inhibitors: Secondary | ICD-10-CM | POA: Insufficient documentation

## 2022-02-25 DIAGNOSIS — Z809 Family history of malignant neoplasm, unspecified: Secondary | ICD-10-CM | POA: Insufficient documentation

## 2022-02-25 DIAGNOSIS — Z8249 Family history of ischemic heart disease and other diseases of the circulatory system: Secondary | ICD-10-CM | POA: Insufficient documentation

## 2022-02-25 DIAGNOSIS — Z833 Family history of diabetes mellitus: Secondary | ICD-10-CM | POA: Insufficient documentation

## 2022-02-25 DIAGNOSIS — N6489 Other specified disorders of breast: Secondary | ICD-10-CM | POA: Insufficient documentation

## 2022-02-25 DIAGNOSIS — Z91041 Radiographic dye allergy status: Secondary | ICD-10-CM | POA: Insufficient documentation

## 2022-02-25 DIAGNOSIS — Z1502 Genetic susceptibility to malignant neoplasm of ovary: Secondary | ICD-10-CM | POA: Insufficient documentation

## 2022-02-25 DIAGNOSIS — N858 Other specified noninflammatory disorders of uterus: Secondary | ICD-10-CM | POA: Insufficient documentation

## 2022-02-25 LAB — RAD ONC ARIA SESSION SUMMARY
Course Elapsed Days: 20
Plan Fractions Treated to Date: 13
Plan Prescribed Dose Per Fraction: 2.66 Gy
Plan Total Fractions Prescribed: 16
Plan Total Prescribed Dose: 42.56 Gy
Reference Point Dosage Given to Date: 34.58 Gy
Reference Point Session Dosage Given: 2.66 Gy
Session Number: 13

## 2022-02-25 MED ORDER — ANASTROZOLE 1 MG PO TABS: 1.0000 mg | ORAL_TABLET | Freq: Every day | ORAL | 3 refills | Status: DC

## 2022-02-25 NOTE — Assessment & Plan Note (Signed)
This is a very pleasant 56 year old female patient with newly diagnosed left breast grade 2 invasive ductal carcinoma, ER/PR positive, HER2 negative by FISH referred to breast [place] for additional recommendations.  We have discussed about proceeding with upfront surgery given the small size of the tumor and node negative as well as strong ER/PR positivity.   Oncotype of 25, no additional benefit from chemotherapy.  She is now undergoing adjuvant radiation.  She is tolerating radiation really well. No concerning findings on exam, some bruising noted around left nipple. We have once again discussed options for anti estrogen therapy today including tamoxifen versus aromatase inhibitors.  At this time she appears to be leaning towards aromatase inhibitors. Baseline bone density screening ordered. She will start anastrozole first week of New Year's and return to clinic in about 4 months from now for survivorship visit as well as toxicity check.

## 2022-02-25 NOTE — Telephone Encounter (Signed)
Faxed prescription for compression sleeve to [facility] @ [phone] per Dr. Rob Hickman  verbal order Fax confirmation received  Patient informed by phone

## 2022-02-25 NOTE — Progress Notes (Signed)
[place] NOTE  Patient Care Team: Alroy Dust, L.Marlou Sa, MD as PCP - General (Family Medicine) Rockwell Germany, RN as Oncology Nurse Navigator Mauro Kaufmann, RN as Oncology Nurse Navigator Stark Klein, MD as Consulting Physician (General Surgery) Benay Pike, MD as Consulting Physician (Hematology and Oncology) Kyung Rudd, MD as Consulting Physician (Radiation Oncology)  CHIEF COMPLAINTS/PURPOSE OF CONSULTATION:  Newly diagnosed breast cancer  HISTORY OF PRESENTING ILLNESS:   Kari Torres 56 y.o. female is here because of recent diagnosis of left breast invasive ductal carcinoma  I reviewed her records extensively and collaborated the history with the patient.  SUMMARY OF ONCOLOGIC HISTORY: Oncology History  Malignant neoplasm of upper-outer quadrant of left breast in female, estrogen receptor positive ([place])  11/06/2021 Mammogram   Bilateral screening mammogram shows left breast asymmetry which is indeterminate.  Additional views with possible ultrasound are recommended.  Left breast unilateral diagnostic mammogram confirmed a 1.6 x 1.1 cm upper outer aspect middle depth asymmetry with no other significant masses or calcifications.   11/12/2021 Pathology Results   Left breast needle core biopsy at 2:00 shows invasive ductal carcinoma, grade 2 prognostics ER 100% positive strong staining PR 50% positive moderate to strong staining Ki-67 of 5% and group 4 HER2 negative   11/19/2021 Initial Diagnosis   Malignant neoplasm of upper-outer quadrant of left breast in female, estrogen receptor positive ([place])   11/20/2021 Cancer Staging   Staging form: Breast, AJCC 8th Edition - Clinical stage from 11/20/2021: Stage IA (cT1c, cN0, cM0, G2, ER+, PR+, HER2-) - Signed by Hayden Pedro, PA-C on 11/20/2021 Method of lymph node assessment: Clinical Histologic grading system: 3 grade system   11/30/2021 Genetic Testing   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  [facility] BRCAPlus report date is 11/30/2021.  [facility] CancerNext-Expanded +RNAinsight report date is 12/06/2021.   The CancerNext-Expanded gene panel offered by [facility] and includes sequencing, rearrangement, and RNA analysis for the following 77 genes: AIP, ALK, APC, ATM, AXIN2, BAP1, BARD1, BLM, BMPR1A, BRCA1, BRCA2, BRIP1, CDC73, CDH1, CDK4, CDKN1B, CDKN2A, CHEK2, CTNNA1, DICER1, FANCC, FH, FLCN, GALNT12, KIF1B, LZTR1, MAX, MEN1, MET, MLH1, MSH2, MSH3, MSH6, MUTYH, NBN, NF1, NF2, NTHL1, PALB2, PHOX2B, PMS2, POT1, PRKAR1A, PTCH1, PTEN, RAD51C, RAD51D, RB1, RECQL, RET, SDHA, SDHAF2, SDHB, SDHC, SDHD, SMAD4, SMARCA4, SMARCB1, SMARCE1, STK11, SUFU, TMEM127, TP53, TSC1, TSC2, VHL and XRCC2 (sequencing and deletion/duplication); EGFR, EGLN1, HOXB13, KIT, MITF, PDGFRA, POLD1, and POLE (sequencing only); EPCAM and GREM1 (deletion/duplication only).     Kari Torres is here for follow up while on adj radiation. She is doing really well overall. She is leaning towards aromatase inhibitor. 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    SURGICAL HISTORY: Past Surgical History:  Procedure Laterality Date   BREAST LUMPECTOMY Left 01/29/2017   Procedure: LEFT BREAST LUMPECTOMY SUBAREOLAR DISSECTION ERAS PATHWAY;  Surgeon: Fanny Skates, MD;  Location: [place];  Service: General;  Laterality: Left;  LMA   BREAST LUMPECTOMY WITH RADIOACTIVE SEED AND SENTINEL LYMPH NODE BIOPSY Left 12/06/2021   Procedure: LEFT BREAST LUMPECTOMY WITH RADIOACTIVE  SEED AND SENTINEL LYMPH NODE BIOPSY;  Surgeon: Stark Klein, MD;   Location: [place];  Service: General;  Laterality: Left;   BREAST LUMPECTOMY WITH RADIOACTIVE SEED LOCALIZATION Left 09/15/2015   Procedure: LEFT BREAST LUMPECTOMY WITH RADIOACTIVE SEED LOCALIZATION;  Surgeon: Fanny Skates, MD;  Location: [facility] [facility];  Service: General;  Laterality: Left;   CESAREAN SECTION     WISDOM TOOTH EXTRACTION      SOCIAL HISTORY: Social History   Socioeconomic History   Marital status: Divorced    Spouse name: Not on file   Number of children: Not on file   Years of education: Not on file   Highest education level: Not on file  Occupational History   Not on file  Tobacco Use   Smoking status: Never   Smokeless tobacco: Never  Vaping Use   Vaping Use: Never used  Substance and Sexual Activity   Alcohol use: Yes    Comment: occasional   Drug use: No   Sexual activity: Not on file  Other Topics Concern   Not on file  Social History Narrative   Not on file   Social Determinants of Health   Financial Resource Strain: Low Risk  (11/21/2021)   Overall Financial Resource Strain (CARDIA)    Difficulty of Paying Living Expenses: Not very hard  Food Insecurity: No Food Insecurity (11/21/2021)   Hunger Vital Sign    Worried About Running Out of Food in the Last Year: Never true    Ran Out of Food in the Last Year: Never true  Transportation Needs: No Transportation Needs (11/21/2021)   PRAPARE - Hydrologist (Medical): No    Lack of Transportation (Non-Medical): No  Physical Activity: Not on file  Stress: Not on file  Social Connections: Not on file  Intimate Partner Violence: Not on file    FAMILY HISTORY: Family History  Problem Relation Age of Onset   Hypertension Mother    Breast cancer Mother 80       met d. 66   Hypertension Father    Diabetes Father    CAD Father    Brain cancer Maternal Aunt        dx 8s; d. 7s   Cancer Maternal Uncle        unknown type; ? lung;  dx 60s    Hypertension Maternal Grandmother    Diabetes Maternal Grandmother    Congestive Heart Failure Maternal Grandmother    Diabetes Paternal Grandmother    Diabetes Paternal Grandfather    Breast cancer Other        MGM's sister with breast cancer or other primary   Brain cancer Cousin        mat female cousin; dx 45s; d. 76s   Bladder Cancer Half-Brother        dx 48s; paternal half brother    ALLERGIES:  is allergic to iodine and shellfish allergy.  MEDICATIONS:  Current Outpatient Medications  Medication Sig Dispense Refill   anastrozole (ARIMIDEX) 1 MG tablet Take 1 tablet (1 mg total) by mouth daily. 90 tablet 3   aspirin 81 MG tablet Take 81 mg by mouth daily.     atenolol (TENORMIN) 100 MG tablet Take 100 mg by mouth daily.     atorvastatin (LIPITOR) 20 MG tablet Take 20 mg by mouth daily.     Collagen-Vitamin C (COLLAGEN PLUS VITAMIN C PO) Take by mouth.     diltiazem (DILACOR XR)  240 MG 24 hr capsule Take 240 mg by mouth daily.     doxycycline (ADOXA) 50 MG tablet Take 50 mg by mouth 2 (two) times daily.     FARXIGA 5 MG TABS tablet Take 5 mg by mouth daily.     hydrochlorothiazide (HYDRODIURIL) 25 MG tablet Take 25 mg by mouth daily.     loratadine (CLARITIN) 10 MG tablet Take 10 mg by mouth daily as needed for allergies.     LUTEIN PO Take by mouth.     metFORMIN (GLUCOPHAGE) 1000 MG tablet Take 1,000 mg by mouth 2 (two) times daily.     metFORMIN (GLUCOPHAGE) 500 MG tablet Take 500 mg by mouth 2 (two) times daily with a meal.     Multiple Vitamins-Minerals (CENTRUM ADULTS PO) Take 1 tablet by mouth daily.     omega-3 acid ethyl esters (LOVAZA) 1 g capsule Take by mouth 2 (two) times daily.     oxyCODONE (OXY IR/ROXICODONE) 5 MG immediate release tablet Take 1 tablet (5 mg total) by mouth every 6 (six) hours as needed for severe pain. 5 tablet 0   TIADYLT ER 240 MG 24 hr capsule Take 240 mg by mouth daily.     No current facility-administered medications for this visit.     REVIEW OF SYSTEMS:   Constitutional: Denies fevers, chills or abnormal night sweats Eyes: Denies blurriness of vision, double vision or watery eyes Ears, nose, mouth, throat, and face: Denies mucositis or sore throat Respiratory: Denies cough, dyspnea or wheezes Cardiovascular: Denies palpitation, chest discomfort or lower extremity swelling Gastrointestinal:  Denies nausea, heartburn or change in bowel habits Skin: Denies abnormal skin rashes Lymphatics: Denies new lymphadenopathy or easy bruising Neurological:Denies numbness, tingling or new weaknesses Behavioral/Psych: Mood is stable, no new changes  Breast: Denies any palpable lumps or discharge All other systems were reviewed with the patient and are negative.  PHYSICAL EXAMINATION: ECOG PERFORMANCE STATUS: 0 - Asymptomatic  Vitals:   02/25/22 0843  BP: (!) 154/85  Pulse: 70  Resp: 14  Temp: (!) 97.5 F (36.4 C)  SpO2: 100%   Filed Weights   02/25/22 0843  Weight: 141 lb 4.8 oz (64.1 kg)    GENERAL:alert, no distress and comfortable Skin, Bruising noted left nipple, Otherwise some post op axillary swelling. No concerns for infection  LABORATORY DATA:  I have reviewed the data as listed Lab Results  Component Value Date   WBC 6.3 11/21/2021   HGB 13.9 11/21/2021   HCT 41.2 11/21/2021   MCV 88.6 11/21/2021   PLT 359 11/21/2021   Lab Results  Component Value Date   NA 136 11/21/2021   K 3.8 11/21/2021   CL 97 (L) 11/21/2021   CO2 31 11/21/2021    RADIOGRAPHIC STUDIES: I have personally reviewed the radiological reports and agreed with the findings in the report.  ASSESSMENT AND PLAN:  Malignant neoplasm of upper-outer quadrant of left breast in female, estrogen receptor positive ([place]) This is a very pleasant 56 year old female patient with newly diagnosed left breast grade 2 invasive ductal carcinoma, ER/PR positive, HER2 negative by FISH referred to [facility] for additional recommendations.  We have  discussed about proceeding with upfront surgery given the small size of the tumor and node negative as well as strong ER/PR positivity.   Oncotype of 25, no additional benefit from chemotherapy.  She is now undergoing adjuvant radiation.  She is tolerating radiation really well. No concerning findings on exam, some bruising noted around left  nipple. We have once again discussed options for anti estrogen therapy today including tamoxifen versus aromatase inhibitors.  At this time she appears to be leaning towards aromatase inhibitors. Baseline bone density screening ordered. She will start anastrozole first week of New Year's and return to clinic in about 4 months from now for survivorship visit as well as toxicity check.  Total time spent: 30 minutes including history, physical exam, review of records, counseling and coordination of care  All questions were answered. The patient knows to call the clinic with any problems, questions or concerns.    Benay Pike, MD 02/25/22

## 2022-02-26 ENCOUNTER — Telehealth: Payer: Self-pay | Admitting: *Deleted

## 2022-02-26 ENCOUNTER — Ambulatory Visit: Payer: Managed Care, Other (non HMO)

## 2022-02-26 ENCOUNTER — Ambulatory Visit
Admission: RE | Admit: 2022-02-26 | Discharge: 2022-02-26 | Disposition: A | Discharge status: Home / Self Care | Payer: Managed Care, Other (non HMO) | Source: Ambulatory Visit | Attending: Radiation Oncology

## 2022-02-26 ENCOUNTER — Other Ambulatory Visit: Payer: Self-pay

## 2022-02-26 DIAGNOSIS — Z510 Encounter for antineoplastic radiation therapy: Secondary | ICD-10-CM | POA: Diagnosis not present

## 2022-02-26 LAB — RAD ONC ARIA SESSION SUMMARY
Course Elapsed Days: 21
Plan Fractions Treated to Date: 14
Plan Prescribed Dose Per Fraction: 2.66 Gy
Plan Total Fractions Prescribed: 16
Plan Total Prescribed Dose: 42.56 Gy
Reference Point Dosage Given to Date: 37.24 Gy
Reference Point Session Dosage Given: 2.66 Gy
Session Number: 14

## 2022-02-26 NOTE — Telephone Encounter (Signed)
LMOM for the patient to call the office back. Patient needs to be scheduled for a new patient appt Dr Berline Lopes on 12/15

## 2022-02-27 ENCOUNTER — Other Ambulatory Visit: Payer: Self-pay

## 2022-02-27 ENCOUNTER — Ambulatory Visit: Payer: Managed Care, Other (non HMO)

## 2022-02-27 ENCOUNTER — Ambulatory Visit
Admission: RE | Admit: 2022-02-27 | Discharge: 2022-02-27 | Disposition: A | Discharge status: Home / Self Care | Payer: Managed Care, Other (non HMO) | Source: Ambulatory Visit | Attending: Radiation Oncology | Admitting: Radiation Oncology

## 2022-02-27 DIAGNOSIS — Z510 Encounter for antineoplastic radiation therapy: Secondary | ICD-10-CM | POA: Diagnosis not present

## 2022-02-27 LAB — RAD ONC ARIA SESSION SUMMARY
Course Elapsed Days: 22
Plan Fractions Treated to Date: 15
Plan Prescribed Dose Per Fraction: 2.66 Gy
Plan Total Fractions Prescribed: 16
Plan Total Prescribed Dose: 42.56 Gy
Reference Point Dosage Given to Date: 39.9 Gy
Reference Point Session Dosage Given: 2.66 Gy
Session Number: 15

## 2022-02-27 NOTE — Telephone Encounter (Signed)
LMOM for the patient to call the office back. Patient needs to be scheduled for a new patient appt Dr Berline Lopes on 12/15

## 2022-02-28 ENCOUNTER — Ambulatory Visit: Payer: Managed Care, Other (non HMO)

## 2022-02-28 ENCOUNTER — Other Ambulatory Visit: Payer: Self-pay

## 2022-02-28 ENCOUNTER — Ambulatory Visit
Admission: RE | Admit: 2022-02-28 | Discharge: 2022-02-28 | Disposition: A | Discharge status: Home / Self Care | Payer: Managed Care, Other (non HMO) | Source: Ambulatory Visit | Attending: Radiation Oncology | Admitting: Radiation Oncology

## 2022-02-28 DIAGNOSIS — Z510 Encounter for antineoplastic radiation therapy: Secondary | ICD-10-CM | POA: Diagnosis not present

## 2022-02-28 LAB — RAD ONC ARIA SESSION SUMMARY
Course Elapsed Days: 23
Plan Fractions Treated to Date: 16
Plan Prescribed Dose Per Fraction: 2.66 Gy
Plan Total Fractions Prescribed: 16
Plan Total Prescribed Dose: 42.56 Gy
Reference Point Dosage Given to Date: 42.56 Gy
Reference Point Session Dosage Given: 2.66 Gy
Session Number: 16

## 2022-02-28 NOTE — Telephone Encounter (Signed)
    LMOM for the patient to call the office back. Patient needs to be scheduled for a new patient appt Dr Berline Lopes on 12/15

## 2022-02-28 NOTE — Telephone Encounter (Signed)
Spoke with the patient regarding the referral to GYN oncology. Patient scheduled as new patient with Dr Berline Lopes on 12/15 at 9:45 am.  Patient given an arrival time of 9 am.  Explained to the patient the the doctor will perform a pelvic exam at this visit. Patient given the policy that no visitors under the 16 yrs are allowed in the [place]. Patient given the address/phone number for the clinic and that the center offers free valet service. Patient aware of the new mask mandate.

## 2022-03-01 ENCOUNTER — Ambulatory Visit
Admission: RE | Admit: 2022-03-01 | Discharge: 2022-03-01 | Disposition: A | Discharge status: Home / Self Care | Payer: Managed Care, Other (non HMO) | Source: Ambulatory Visit | Attending: Radiation Oncology

## 2022-03-01 ENCOUNTER — Ambulatory Visit: Payer: Managed Care, Other (non HMO)

## 2022-03-01 ENCOUNTER — Other Ambulatory Visit: Payer: Self-pay

## 2022-03-01 DIAGNOSIS — Z510 Encounter for antineoplastic radiation therapy: Secondary | ICD-10-CM | POA: Diagnosis not present

## 2022-03-01 DIAGNOSIS — Z170 Estrogen receptor positive status [ER+]: Secondary | ICD-10-CM

## 2022-03-01 LAB — RAD ONC ARIA SESSION SUMMARY
Course Elapsed Days: 24
Plan Fractions Treated to Date: 1
Plan Prescribed Dose Per Fraction: 2 Gy
Plan Total Fractions Prescribed: 4
Plan Total Prescribed Dose: 8 Gy
Reference Point Dosage Given to Date: 2 Gy
Reference Point Session Dosage Given: 2 Gy
Session Number: 17

## 2022-03-01 MED ORDER — RADIAPLEXRX EX GEL: Freq: Once | CUTANEOUS | Status: AC

## 2022-03-04 ENCOUNTER — Other Ambulatory Visit: Payer: Self-pay

## 2022-03-04 ENCOUNTER — Ambulatory Visit
Admission: RE | Admit: 2022-03-04 | Discharge: 2022-03-04 | Disposition: A | Discharge status: Home / Self Care | Payer: Managed Care, Other (non HMO) | Source: Ambulatory Visit | Attending: Radiation Oncology | Admitting: Radiation Oncology

## 2022-03-04 DIAGNOSIS — Z510 Encounter for antineoplastic radiation therapy: Secondary | ICD-10-CM | POA: Diagnosis not present

## 2022-03-04 LAB — RAD ONC ARIA SESSION SUMMARY
Course Elapsed Days: 27
Plan Fractions Treated to Date: 2
Plan Prescribed Dose Per Fraction: 2 Gy
Plan Total Fractions Prescribed: 4
Plan Total Prescribed Dose: 8 Gy
Reference Point Dosage Given to Date: 4 Gy
Reference Point Session Dosage Given: 2 Gy
Session Number: 18

## 2022-03-05 ENCOUNTER — Other Ambulatory Visit: Payer: Self-pay

## 2022-03-05 ENCOUNTER — Ambulatory Visit
Admission: RE | Admit: 2022-03-05 | Discharge: 2022-03-05 | Disposition: A | Discharge status: Home / Self Care | Payer: Managed Care, Other (non HMO) | Source: Ambulatory Visit | Attending: Radiation Oncology | Admitting: Radiation Oncology

## 2022-03-05 DIAGNOSIS — Z510 Encounter for antineoplastic radiation therapy: Secondary | ICD-10-CM | POA: Diagnosis not present

## 2022-03-05 LAB — RAD ONC ARIA SESSION SUMMARY
Course Elapsed Days: 28
Plan Fractions Treated to Date: 3
Plan Prescribed Dose Per Fraction: 2 Gy
Plan Total Fractions Prescribed: 4
Plan Total Prescribed Dose: 8 Gy
Reference Point Dosage Given to Date: 6 Gy
Reference Point Session Dosage Given: 2 Gy
Session Number: 19

## 2022-03-05 NOTE — Progress Notes (Signed)
                                                                                                                                                             Patient Name: Kari Torres MRN: [id number] DOB: [birth year] Referring Physician: Benay Pike Date of Service: 03/06/2022  [facility],                                                         End Of Treatment Note  Diagnoses: C50.412-Malignant neoplasm of upper-outer quadrant of left female breast Z17.0-Estrogen receptor positive status [ER+]  Cancer Staging: Stage IA, pT1cN0M0, grade 2, ER/PR positive invasive ductal carcinoma of the left breast.   Intent: Curative  Radiation Treatment Dates: 02/05/2022 through 03/06/2022 Site Technique Total Dose (Gy) Dose per Fx (Gy) Completed Fx Beam Energies  Breast, Left: Breast_L 3D 42.56/42.56 2.66 16/16 6XFFF  Breast, Left: Breast_L_Bst 3D 8/8 2 4/4 6X, 10X   Narrative: The patient tolerated radiation therapy relatively well. She developed fatigue and anticipated skin changes in the treatment field.   Plan: The patient will receive a call in about one month from the radiation oncology department. She will continue follow up with Dr. Rob Bunting as well.   ________________________________________________    Carola Rhine, [facility]

## 2022-03-06 ENCOUNTER — Encounter: Payer: Self-pay | Admitting: Radiation Oncology

## 2022-03-06 ENCOUNTER — Ambulatory Visit
Admission: RE | Admit: 2022-03-06 | Discharge: 2022-03-06 | Disposition: A | Discharge status: Home / Self Care | Payer: Managed Care, Other (non HMO) | Source: Ambulatory Visit | Attending: Radiation Oncology | Admitting: Radiation Oncology

## 2022-03-06 ENCOUNTER — Other Ambulatory Visit: Payer: Self-pay

## 2022-03-06 ENCOUNTER — Encounter: Payer: Self-pay | Admitting: Gynecologic Oncology

## 2022-03-06 DIAGNOSIS — Z510 Encounter for antineoplastic radiation therapy: Secondary | ICD-10-CM | POA: Diagnosis not present

## 2022-03-06 LAB — RAD ONC ARIA SESSION SUMMARY
Course Elapsed Days: 29
Plan Fractions Treated to Date: 4
Plan Prescribed Dose Per Fraction: 2 Gy
Plan Total Fractions Prescribed: 4
Plan Total Prescribed Dose: 8 Gy
Reference Point Dosage Given to Date: 8 Gy
Reference Point Session Dosage Given: 2 Gy
Session Number: 20

## 2022-03-08 ENCOUNTER — Inpatient Hospital Stay (HOSPITAL_BASED_OUTPATIENT_CLINIC_OR_DEPARTMENT_OTHER): Payer: Managed Care, Other (non HMO) | Admitting: Gynecologic Oncology

## 2022-03-08 ENCOUNTER — Encounter: Payer: Self-pay | Admitting: Gynecologic Oncology

## 2022-03-08 ENCOUNTER — Other Ambulatory Visit: Payer: Self-pay

## 2022-03-08 ENCOUNTER — Telehealth: Payer: Self-pay | Admitting: *Deleted

## 2022-03-08 VITALS — BP 153/90 | HR 66 | Temp 98.2°F | Resp 18 | Ht 66.0 in | Wt 140.0 lb

## 2022-03-08 DIAGNOSIS — C50412 Malignant neoplasm of upper-outer quadrant of left female breast: Secondary | ICD-10-CM

## 2022-03-08 DIAGNOSIS — Z510 Encounter for antineoplastic radiation therapy: Secondary | ICD-10-CM | POA: Diagnosis not present

## 2022-03-08 DIAGNOSIS — Z148 Genetic carrier of other disease: Secondary | ICD-10-CM

## 2022-03-08 DIAGNOSIS — Z170 Estrogen receptor positive status [ER+]: Secondary | ICD-10-CM | POA: Diagnosis not present

## 2022-03-08 DIAGNOSIS — Z1721 Progesterone receptor positive status: Secondary | ICD-10-CM

## 2022-03-08 DIAGNOSIS — C801 Malignant (primary) neoplasm, unspecified: Secondary | ICD-10-CM | POA: Insufficient documentation

## 2022-03-08 DIAGNOSIS — C50919 Malignant neoplasm of unspecified site of unspecified female breast: Secondary | ICD-10-CM

## 2022-03-08 NOTE — Progress Notes (Signed)
GYNECOLOGIC ONCOLOGY NEW PATIENT CONSULTATION   Patient Name: Kari Torres  Patient Age: 56 y.o. Date of Service: 03/08/22 Referring Provider: Dr. Benay Pike  Primary Care Provider: Alroy Dust, Carlean Jews.Marlou Sa, MD Consulting Provider: Jeral Pinch, MD   Assessment/Plan:  Postmenopausal patient recently diagnosed with estrogen receptor positive breast cancer as well as a pathogenic PAL B2 mutation.  Patient is overall doing well.  She has completed adjuvant therapy for her breast cancer and is initiating aromatase inhibitor therapy soon.  She is ultimately planning on double mastectomy in the future.  Discussed her PAL B2 mutation in the increased risk related to ovarian cancer.  It is estimated that her risk of developing ovarian cancer is 3-5%, increased from baseline risk of 1.3%.  Given this increased risk, NCCN recommends consideration of risk-reducing surgery after 43-85 years of age.  We discussed the difficulty with ovarian cancer as we do not have a screening test similar to breast and colon cancer.  We typically use pelvic ultrasound and CA125 although these have not been shown to help [place] diagnose ovarian cancer at an earlier stage and impact survival.  The patient would like to move forward with scheduling surgery but given some changes at work, is wanting to delay this until after the first quarter of next year.  We will tentatively plan for risk-reducing bilateral salpingo-oophorectomy in April.  She will call if she would like to move surgery date up or back based on work.  In the meantime, we will plan to get a pelvic ultrasound.  Will have the patient sign a release of records to send to her OB/GYN to get most recent Pap test results as well as workup of her postmenopausal spotting last year.  These records were received later in the day after the patient had been seen.  Endometrial biopsy (date difficult to determine) revealed scant atrophic endometrium.  Pap smear was performed in  06/2019.  This was negative for intraepithelial lesion or malignancy and high risk HPV negative.  A copy of this note was sent to the patient's referring provider.   50 minutes of total time was spent for this patient encounter, including preparation, face-to-face counseling with the patient and coordination of care, and documentation of the encounter.  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 y.o. female who is seen in consultation at the request of Dr. Chryl Heck for an evaluation of risk-reducing surgery in the setting of a pathogenic PALB2 mutation.  Patient's history is notable for estrogen receptor positive invasive ductal carcinoma of the left breast, diagnosed in August of this year.  She underwent genetic testing in early September that revealed a pathogenic variant in [place].  She just completed adjuvant radiation with plan to initiate aromatase inhibitor.  Patient reports feeling well.  She denies any pelvic or abdominal pain.  She denies any vaginal bleeding or discharge.  She endorses having some minimal spotting in 2022.  Saw her OB/GYN, Dr. Carlis Abbott.  Had workup for this which she reports was negative.  She has had no bleeding or spotting since.  She endorses normal bowel function, denies any urinary symptoms.  She endorses a good  appetite without nausea or emesis.  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     PAST SURGICAL HISTORY:  Past Surgical History:  Procedure Laterality Date   BREAST LUMPECTOMY Left 01/29/2017   Procedure: LEFT BREAST LUMPECTOMY SUBAREOLAR DISSECTION ERAS PATHWAY;  Surgeon: Fanny Skates, MD;  Location: [place];  Service: General;  Laterality: Left;  LMA   BREAST LUMPECTOMY WITH RADIOACTIVE SEED AND SENTINEL LYMPH NODE BIOPSY Left 12/06/2021   Procedure: LEFT BREAST LUMPECTOMY WITH RADIOACTIVE SEED AND SENTINEL LYMPH NODE BIOPSY;  Surgeon: Stark Klein, MD;  Location: [place];  Service: General;  Laterality: Left;   BREAST LUMPECTOMY WITH RADIOACTIVE SEED LOCALIZATION Left 09/15/2015   Procedure: LEFT BREAST LUMPECTOMY WITH RADIOACTIVE SEED LOCALIZATION;  Surgeon: Fanny Skates, MD;  Location: [place];  Service: General;  Laterality: Left;   CESAREAN SECTION     WISDOM TOOTH EXTRACTION      OB/GYN HISTORY:  OB History  Gravida Para Term Preterm AB Living  2 2          SAB IAB Ectopic Multiple Live Births               # Outcome Date GA Lbr Len/2nd Weight Sex Delivery Anes PTL Lv  2 Para           1 Para             No LMP recorded. Patient is postmenopausal.  Age at menarche: 63  Age at menopause: 11 Hx of HRT: denies Hx of STDs: denies Last pap: 2022 History of abnormal pap smears: denies  SCREENING STUDIES:  Last mammogram: 2023  Last colonoscopy: 2022 Last bone mineral density: scheduled for 07/2022  MEDICATIONS: Outpatient Encounter Medications as of 03/08/2022  Medication Sig   anastrozole (ARIMIDEX) 1 MG tablet Take 1 tablet (1 mg total) by mouth daily.   aspirin 81 MG tablet Take 81 mg by mouth daily.   atenolol (TENORMIN) 100 MG tablet Take 100 mg by mouth daily.   atorvastatin (LIPITOR) 20 MG tablet Take 20 mg by mouth daily.   Collagen-Vitamin C (COLLAGEN PLUS VITAMIN  C PO) Take by mouth.   FARXIGA 5 MG TABS tablet Take 5 mg by mouth daily.   hydrochlorothiazide (HYDRODIURIL) 25 MG tablet Take 25 mg by mouth daily.   loratadine (CLARITIN) 10 MG tablet Take 10 mg by mouth daily as needed for allergies.   LUTEIN PO Take by mouth.   metFORMIN (GLUCOPHAGE) 1000 MG tablet Take 1,000 mg by mouth 2 (two) times daily.   Multiple Vitamins-Minerals (CENTRUM ADULTS PO) Take 1 tablet by mouth daily.   omega-3 acid ethyl esters (LOVAZA) 1 g capsule Take by mouth 2 (two) times daily.   TIADYLT ER 240 MG 24 hr capsule Take 240 mg by mouth daily.   triamcinolone ointment (KENALOG) 0.1 % SMARTSIG:1 Topical Daily   [DISCONTINUED] diltiazem (DILACOR XR) 240 MG 24 hr capsule Take 240 mg by mouth daily.   [DISCONTINUED] doxycycline (ADOXA) 50 MG tablet Take 50 mg by mouth 2 (two) times daily.   [DISCONTINUED] metFORMIN (GLUCOPHAGE) 500 MG tablet Take 500 mg by mouth 2 (  two) times daily with a meal.   [DISCONTINUED] oxyCODONE (OXY IR/ROXICODONE) 5 MG immediate release tablet Take 1 tablet (5 mg total) by mouth every 6 (six) hours as needed for severe pain.   No facility-administered encounter medications on file as of 03/08/2022.    ALLERGIES:  Allergies  Allergen Reactions   Iodine Hives   Shellfish Allergy Hives     FAMILY HISTORY:  Family History  Problem Relation Age of Onset   Hypertension Mother    Breast cancer Mother 49       met d. 67   Hypertension Father    Diabetes Father    CAD Father    Brain cancer Maternal Aunt        dx 29s; d. 70s   Cancer Maternal Uncle        unknown type; ? lung;  dx 60s   Hypertension Maternal Grandmother    Diabetes Maternal Grandmother    Congestive Heart Failure Maternal Grandmother    Diabetes Paternal Grandmother    Diabetes Paternal Grandfather    Breast cancer Other        MGM's sister with breast cancer or other primary   Brain cancer Cousin        mat female cousin; dx 9s; d. 9s   Bladder Cancer  Half-Brother        dx 53s; paternal half brother     SOCIAL HISTORY:  Social Connections: Not on file    REVIEW OF SYSTEMS:  Denies appetite changes, fevers, chills, fatigue, unexplained weight changes. Denies hearing loss, neck lumps or masses, mouth sores, ringing in ears or voice changes. Denies cough or wheezing.  Denies shortness of breath. Denies chest pain or palpitations. Denies leg swelling. Denies abdominal distention, pain, blood in stools, constipation, diarrhea, nausea, vomiting, or early satiety. Denies pain with intercourse, dysuria, frequency, hematuria or incontinence. Denies hot flashes, pelvic pain, vaginal bleeding or vaginal discharge.   Denies joint pain, back pain or muscle pain/cramps. Denies itching, rash, or wounds. Denies dizziness, headaches, numbness or seizures. Denies swollen lymph nodes or glands, denies easy bruising or bleeding. Denies anxiety, depression, confusion, or decreased concentration.  Physical Exam:  Vital Signs for this encounter:  Blood pressure (!) 153/90, pulse 66, temperature 98.2 F (36.8 C), temperature source Oral, resp. rate 18, height _0  (1.676 m), weight 140 lb (63.5 kg), SpO2 100 %. Body mass index is 22.6 kg/m. General: Alert, oriented, no acute distress.  HEENT: Normocephalic, atraumatic. Sclera anicteric.  Chest: Clear to auscultation bilaterally. No wheezes, rhonchi, or rales. Cardiovascular: Regular rate and rhythm, no murmurs, rubs, or gallops.  Abdomen: Normoactive bowel sounds. Soft, nondistended, nontender to palpation. No masses or hepatosplenomegaly appreciated. No palpable fluid wave.  Extremities: Grossly normal range of motion. Warm, well perfused. No edema bilaterally.  Skin: No rashes or lesions.  Lymphatics: No cervical, supraclavicular, or inguinal adenopathy.  GU:  Normal external female genitalia. No lesions. No discharge or bleeding.             Bladder/urethra:  No lesions or masses, well supported  bladder             Vagina: Moderately atrophic.  No lesions noted.             Cervix: Normal appearing, no lesions.             Uterus: Small, mobile, no parametrial involvement or nodularity.             Adnexa: No  masses appreciated.  Rectal: Deferred.  LABORATORY AND RADIOLOGIC DATA:  Outside medical records were reviewed to synthesize the above history, along with the history and physical obtained during the visit.   Lab Results  Component Value Date   WBC 6.3 11/21/2021   HGB 13.9 11/21/2021   HCT 41.2 11/21/2021   PLT 359 11/21/2021   GLUCOSE 203 (H) 11/21/2021   ALT 17 11/21/2021   AST 20 11/21/2021   NA 136 11/21/2021   K 3.8 11/21/2021   CL 97 (L) 11/21/2021   CREATININE 0.85 11/21/2021   BUN 18 11/21/2021   CO2 31 11/21/2021

## 2022-03-08 NOTE — Patient Instructions (Addendum)
Surgery dates for April include April 3, 4, 10, 17, 18, and 24. After discussion, we will plan to schedule your surgery for July 03, 2022. This can always be adjusted if needed.  We will plan to see you back in the office closer to the date. You may also receive a phone call from the hospital to make a preop appointment.     Dr. Berline Lopes will contact you with the results of your ultrasound when available.   Once your surgery is scheduled, if you would like to move this to a sooner date, please call the office at [phone].  Please call for any needs or concerns at the above number as well.

## 2022-03-08 NOTE — Telephone Encounter (Signed)
Error

## 2022-03-11 ENCOUNTER — Ambulatory Visit: Payer: Managed Care, Other (non HMO) | Attending: General Surgery

## 2022-03-11 ENCOUNTER — Other Ambulatory Visit: Payer: Self-pay | Admitting: Gynecologic Oncology

## 2022-03-11 VITALS — Wt 141.2 lb

## 2022-03-11 DIAGNOSIS — Z170 Estrogen receptor positive status [ER+]: Secondary | ICD-10-CM

## 2022-03-11 DIAGNOSIS — C50919 Malignant neoplasm of unspecified site of unspecified female breast: Secondary | ICD-10-CM

## 2022-03-11 DIAGNOSIS — Z483 Aftercare following surgery for neoplasm: Secondary | ICD-10-CM | POA: Insufficient documentation

## 2022-03-11 NOTE — Therapy (Signed)
OUTPATIENT PHYSICAL THERAPY SOZO SCREENING NOTE   Patient Name: Kari Torres MRN: [id number] DOB:12/11/1965, 56 y.o., female Today's Date: 03/11/2022  PCP: Mitchell, L.Dean, MD REFERRING PROVIDER: Mitchell, L.Dean, MD   PT End of Session - 03/11/22 1430     Visit Number 7   # unchanged due to screen only   PT Start Time 1428    PT Stop Time 1432    PT Time Calculation (min) 4 min    Activity Tolerance Patient tolerated treatment well    Behavior During Therapy WFL for tasks assessed/performed             Past Medical History:  Diagnosis Date   Allergic rhinitis due to pollen    Bloody discharge from left nipple 01/29/2017   Breast cancer (HCC)    Diabetes mellitus without complication (HCC)    Family history of breast cancer 11/21/2021   Hypercholesterolemia    Hypertension    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   Past Surgical History:  Procedure Laterality Date   BREAST LUMPECTOMY Left 01/29/2017   Procedure: LEFT BREAST LUMPECTOMY SUBAREOLAR DISSECTION ERAS PATHWAY;  Surgeon: Ingram, Haywood, MD;  Location: [facility];  Service: General;  Laterality: Left;  LMA   BREAST LUMPECTOMY WITH RADIOACTIVE SEED AND SENTINEL LYMPH NODE BIOPSY Left 12/06/2021   Procedure: LEFT BREAST LUMPECTOMY WITH RADIOACTIVE SEED AND SENTINEL LYMPH NODE BIOPSY;  Surgeon: Byerly, Faera, MD;  Location: [facility];  Service: General;  Laterality: Left;   BREAST LUMPECTOMY WITH RADIOACTIVE SEED LOCALIZATION Left 09/15/2015   Procedure: LEFT BREAST LUMPECTOMY WITH RADIOACTIVE SEED LOCALIZATION;  Surgeon: Haywood Ingram, MD;  Location: [facility];  Service: General;  Laterality: Left;   CESAREAN SECTION     WISDOM TOOTH EXTRACTION     Patient Active Problem List   Diagnosis Date Noted   ER+ (estrogen receptor positive status) 03/08/2022   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HCC) 03/08/2022   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    Genetic testing 11/30/2021   Family history of breast cancer 11/21/2021   Family history of brain cancer 11/21/2021   Family history of bladder cancer 11/21/2021   Malignant neoplasm of upper-outer quadrant of left breast in female, estrogen receptor positive (HCC) 11/19/2021   Bloody discharge from left nipple 01/29/2017   Uncontrolled 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 It measures 1.6 cm and is located in the upper outer quadrant. It is ER/PR positive and HER2 negative with a Ki67 of 5%. She is s/p left lumpectomy with SLNB on 12/06/2021 with 0/3 LN. She  does not require chemo, but will have radiation.She also has diabetes and hypertension I ddi well with my ROM initially, and lost some when the hematoma happened. I have been doing the exercises.   PRECAUTIONS: left UE Lymphedema risk, None  SUBJECTIVE: Pt returns for her first 3 month L-Dex screen.   PAIN:  Are you having pain? No  SOZO SCREENING: Patient was assessed today using the SOZO machine to determine the lymphedema index score. This was compared to her baseline score. It was determined that she is within the recommended   range when compared to her baseline and no further action is needed at this time. She will continue SOZO screenings. These are done every 3 months for 2 years post operatively followed by every 6 months for 2 years, and then annually.   L-DEX FLOWSHEETS - 03/11/22 1400       L-DEX LYMPHEDEMA SCREENING   Measurement Type Unilateral    L-DEX MEASUREMENT EXTREMITY Upper Extremity    POSITION  Standing     DOMINANT SIDE Right    At Risk Side Left    BASELINE SCORE (UNILATERAL) 0.6    L-DEX SCORE (UNILATERAL) 0.1    VALUE CHANGE (UNILAT) -0.5               Otelia Limes, PTA 03/11/2022, 2:32 PM

## 2022-03-13 ENCOUNTER — Ambulatory Visit (HOSPITAL_COMMUNITY)
Admission: RE | Admit: 2022-03-13 | Discharge: 2022-03-13 | Disposition: A | Discharge status: Home / Self Care | Payer: Managed Care, Other (non HMO) | Source: Ambulatory Visit | Attending: Gynecologic Oncology | Admitting: Gynecologic Oncology

## 2022-03-13 DIAGNOSIS — C50919 Malignant neoplasm of unspecified site of unspecified female breast: Secondary | ICD-10-CM | POA: Diagnosis present

## 2022-04-19 ENCOUNTER — Encounter: Payer: Self-pay | Admitting: *Deleted

## 2022-06-06 ENCOUNTER — Encounter: Payer: Self-pay | Admitting: Gynecologic Oncology

## 2022-06-06 ENCOUNTER — Inpatient Hospital Stay: Payer: Managed Care, Other (non HMO) | Attending: Hematology and Oncology | Admitting: Gynecologic Oncology

## 2022-06-06 ENCOUNTER — Inpatient Hospital Stay (HOSPITAL_BASED_OUTPATIENT_CLINIC_OR_DEPARTMENT_OTHER): Payer: Managed Care, Other (non HMO) | Admitting: Gynecologic Oncology

## 2022-06-06 VITALS — BP 149/88 | HR 73 | Temp 98.6°F | Wt 142.4 lb

## 2022-06-06 DIAGNOSIS — C50412 Malignant neoplasm of upper-outer quadrant of left female breast: Secondary | ICD-10-CM | POA: Diagnosis present

## 2022-06-06 DIAGNOSIS — Z1501 Genetic susceptibility to malignant neoplasm of breast: Secondary | ICD-10-CM | POA: Diagnosis not present

## 2022-06-06 DIAGNOSIS — Z7189 Other specified counseling: Secondary | ICD-10-CM

## 2022-06-06 DIAGNOSIS — Z148 Genetic carrier of other disease: Secondary | ICD-10-CM | POA: Diagnosis present

## 2022-06-06 DIAGNOSIS — Z170 Estrogen receptor positive status [ER+]: Secondary | ICD-10-CM | POA: Diagnosis not present

## 2022-06-06 DIAGNOSIS — C50919 Malignant neoplasm of unspecified site of unspecified female breast: Secondary | ICD-10-CM

## 2022-06-06 DIAGNOSIS — Z1509 Genetic susceptibility to other malignant neoplasm: Secondary | ICD-10-CM | POA: Diagnosis not present

## 2022-06-06 DIAGNOSIS — Z1502 Genetic susceptibility to malignant neoplasm of ovary: Secondary | ICD-10-CM | POA: Diagnosis not present

## 2022-06-06 MED ORDER — IBUPROFEN 800 MG PO TABS: 800.0000 mg | ORAL_TABLET | Freq: Three times a day (TID) | ORAL | 0 refills | Status: DC | PRN

## 2022-06-06 MED ORDER — SENNOSIDES-DOCUSATE SODIUM 8.6-50 MG PO TABS: 2.0000 | ORAL_TABLET | Freq: Every day | ORAL | 0 refills | Status: DC

## 2022-06-06 MED ORDER — TRAMADOL HCL 50 MG PO TABS: 50.0000 mg | ORAL_TABLET | Freq: Four times a day (QID) | ORAL | 0 refills | Status: DC | PRN

## 2022-06-06 NOTE — Progress Notes (Signed)
Gynecologic Oncology Return Clinic Visit  06/06/22  Reason for Visit: treatment planning  Treatment History: Oncology History  Malignant neoplasm of upper-outer quadrant of left breast in female, estrogen receptor positive ([place])  11/06/2021 Mammogram   Bilateral screening mammogram shows left breast asymmetry which is indeterminate.  Additional views with possible ultrasound are recommended.  Left breast unilateral diagnostic mammogram confirmed a 1.6 x 1.1 cm upper outer aspect middle depth asymmetry with no other significant masses or calcifications.   11/12/2021 Pathology Results   Left breast needle core biopsy at 2:00 shows invasive ductal carcinoma, grade 2 prognostics ER 100% positive strong staining PR 50% positive moderate to strong staining Ki-67 of 5% and group 4 HER2 negative   11/19/2021 Initial Diagnosis   Malignant neoplasm of upper-outer quadrant of left breast in female, estrogen receptor positive ([place])   11/20/2021 Cancer Staging   Staging form: Breast, AJCC 8th Edition - Clinical stage from 11/20/2021: Stage IA (cT1c, cN0, cM0, G2, ER+, PR+, HER2-) - Signed by Hayden Pedro, PA-C on 11/20/2021 Method of lymph node assessment: Clinical Histologic grading system: 3 grade system   11/30/2021 Genetic Testing   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  [facility] BRCAPlus report date is 11/30/2021.  [facility] CancerNext-Expanded +RNAinsight report date is 12/06/2021.   The CancerNext-Expanded gene panel offered by [facility] and includes sequencing, rearrangement, and RNA analysis for the following 77 genes: AIP, ALK, APC, ATM, AXIN2, BAP1, BARD1, BLM, BMPR1A, BRCA1, BRCA2, BRIP1, CDC73, CDH1, CDK4, CDKN1B, CDKN2A, CHEK2, CTNNA1, DICER1, FANCC, FH, FLCN, GALNT12, KIF1B, LZTR1, MAX, MEN1, MET, MLH1, MSH2, MSH3, MSH6, MUTYH, NBN, NF1, NF2, NTHL1, PALB2, PHOX2B, PMS2, POT1, PRKAR1A, PTCH1, PTEN, RAD51C, RAD51D, RB1, RECQL, RET, SDHA, SDHAF2, SDHB, SDHC, SDHD,  SMAD4, SMARCA4, SMARCB1, SMARCE1, STK11, SUFU, TMEM127, TP53, TSC1, TSC2, VHL and XRCC2 (sequencing and deletion/duplication); EGFR, EGLN1, HOXB13, KIT, MITF, PDGFRA, POLD1, and POLE (sequencing only); EPCAM and GREM1 (deletion/duplication only).     Endometrial biopsy (date difficult to determine) revealed scant atrophic endometrium. Pap smear was performed in 06/2019. This was negative for intraepithelial lesion or malignancy and high risk HPV negative.   Interval History: Doing well since her last visit with me.  Continues to experience menopausal symptoms.  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    Past Surgical History:  Procedure Laterality Date   BREAST LUMPECTOMY Left 01/29/2017   Procedure: LEFT BREAST LUMPECTOMY SUBAREOLAR DISSECTION ERAS PATHWAY;  Surgeon: Fanny Skates, MD;  Location: [place];  Service: General;  Laterality: Left;  LMA   BREAST LUMPECTOMY WITH RADIOACTIVE SEED AND SENTINEL LYMPH NODE BIOPSY Left 12/06/2021   Procedure: LEFT BREAST LUMPECTOMY WITH RADIOACTIVE SEED AND SENTINEL LYMPH NODE BIOPSY;  Surgeon: Stark Klein, MD;  Location: [facility];  Service: General;  Laterality: Left;   BREAST LUMPECTOMY WITH RADIOACTIVE SEED LOCALIZATION Left 09/15/2015   Procedure: LEFT BREAST LUMPECTOMY WITH RADIOACTIVE SEED LOCALIZATION;  Surgeon: Fanny Skates, MD;  Location: [place];  Service: General;  Laterality: Left;   CESAREAN SECTION     WISDOM TOOTH EXTRACTION      Family History  Problem Relation Age of Onset  Hypertension Mother    Breast cancer Mother 34       met d. 71   Hypertension Father     Diabetes Father    CAD Father    Brain cancer Maternal Aunt        dx 33s; d. 37s   Cancer Maternal Uncle        unknown type; ? lung;  dx 60s   Hypertension Maternal Grandmother    Diabetes Maternal Grandmother    Congestive Heart Failure Maternal Grandmother    Diabetes Paternal Grandmother    Diabetes Paternal Grandfather    Breast cancer Other        MGM's sister with breast cancer or other primary   Brain cancer Cousin        mat female cousin; dx 63s; d. 91s   Bladder Cancer Half-Brother        dx 3s; paternal half brother    Social History   Socioeconomic History   Marital status: Divorced    Spouse name: Not on file   Number of children: Not on file   Years of education: Not on file   Highest education level: Not on file  Occupational History   Occupation: works as an Optometrist  Tobacco Use   Smoking status: Never   Smokeless tobacco: Never  Scientific laboratory technician Use: Never used  Substance and Sexual Activity   Alcohol use: Yes    Comment: occasional   Drug use: No   Sexual activity: Not Currently  Other Topics Concern   Not on file  Social History Narrative   Not on file   Social Determinants of Health   Financial Resource Strain: Low Risk  (11/21/2021)   Overall Financial Resource Strain (CARDIA)    Difficulty of Paying Living Expenses: Not very hard  Food Insecurity: No Food Insecurity (11/21/2021)   Hunger Vital Sign    Worried About Running Out of Food in the Last Year: Never true    Ran Out of Food in the Last Year: Never true  Transportation Needs: No Transportation Needs (11/21/2021)   PRAPARE - Hydrologist (Medical): No    Lack of Transportation (Non-Medical): No  Physical Activity: Not on file  Stress: Not on file  Social Connections: Not on file    Current Medications:  Current Outpatient Medications:    ibuprofen (ADVIL) 800 MG tablet, Take 1 tablet (800 mg total) by mouth every 8 (eight) hours as needed  for moderate pain. For AFTER surgery only, Disp: 30 tablet, Rfl: 0   senna-docusate (SENOKOT-S) 8.6-50 MG tablet, Take 2 tablets by mouth at bedtime. For AFTER surgery, do not take if having diarrhea, Disp: 30 tablet, Rfl: 0   traMADol (ULTRAM) 50 MG tablet, Take 1 tablet (50 mg total) by mouth every 6 (six) hours as needed for severe pain. For AFTER surgery only, do not take and drive, Disp: 10 tablet, Rfl: 0   anastrozole (ARIMIDEX) 1 MG tablet, Take 1 tablet (1 mg total) by mouth daily., Disp: 90 tablet, Rfl: 3   aspirin 81 MG tablet, Take 81 mg by mouth daily., Disp: , Rfl:    atenolol (TENORMIN) 100 MG tablet, Take 100 mg by mouth daily., Disp: , Rfl:    atorvastatin (LIPITOR) 20 MG tablet, Take 20 mg by mouth daily., Disp: , Rfl:    Collagen-Vitamin C (COLLAGEN PLUS VITAMIN C PO), Take by mouth., Disp: , Rfl:    FARXIGA 5 MG  TABS tablet, Take 5 mg by mouth daily., Disp: , Rfl:    hydrochlorothiazide (HYDRODIURIL) 25 MG tablet, Take 25 mg by mouth daily., Disp: , Rfl:    loratadine (CLARITIN) 10 MG tablet, Take 10 mg by mouth daily as needed for allergies., Disp: , Rfl:    LUTEIN PO, Take by mouth., Disp: , Rfl:    metFORMIN (GLUCOPHAGE) 1000 MG tablet, Take 1,000 mg by mouth 2 (two) times daily., Disp: , Rfl:    Multiple Vitamins-Minerals (CENTRUM ADULTS PO), Take 1 tablet by mouth daily., Disp: , Rfl:    omega-3 acid ethyl esters (LOVAZA) 1 g capsule, Take by mouth 2 (two) times daily., Disp: , Rfl:    TIADYLT ER 240 MG 24 hr capsule, Take 240 mg by mouth daily., Disp: , Rfl:    triamcinolone ointment (KENALOG) 0.1 %, SMARTSIG:1 Topical Daily, Disp: , Rfl:   Review of Systems: + back pain, hot flashes Denies appetite changes, fevers, chills, fatigue, unexplained weight changes. Denies hearing loss, neck lumps or masses, mouth sores, ringing in ears or voice changes. Denies cough or wheezing.  Denies shortness of breath. Denies chest pain or palpitations. Denies leg swelling. Denies  abdominal distention, pain, blood in stools, constipation, diarrhea, nausea, vomiting, or early satiety. Denies pain with intercourse, dysuria, frequency, hematuria or incontinence. Denies pelvic pain, vaginal bleeding or vaginal discharge.   Denies joint pain or muscle pain/cramps. Denies itching, rash, or wounds. Denies dizziness, headaches, numbness or seizures. Denies swollen lymph nodes or glands, denies easy bruising or bleeding. Denies anxiety, depression, confusion, or decreased concentration.  Physical Exam: BP (!) 149/88 (BP Location: Right Arm, Patient Position: Sitting)   Pulse 73   Temp 98.6 F (37 C) (Oral)   Wt 142 lb 6.4 oz (64.6 kg)   SpO2 94%   BMI 22.98 kg/m  General: Alert, oriented, no acute distress. HEENT: Normocephalic, atraumatic, sclera anicteric. Chest: Unlabored breathing on room air.  Laboratory & Radiologic Studies: Pelvic ultrasound 03/13/22: IMPRESSION: 1. There is a 2.4 x 2.3 x 2.2 cm fibroid in the posterior left uterine fundus. 2. The endometrium is normal in thickness. 3. The left ovary is normal in appearance. The right ovary was not visualized. 4. No other abnormalities.  Assessment & Plan: Kari Torres is a 57 y.o. woman with estrogen receptor positive breast cancer as well as a pathogenic PALB2 mutation.   Patient is overall doing well.  She is scheduled for surgery in April and ready to proceed.  Please see my note for previous counseling.  Given her estrogen receptor positive breast cancer as well as her genetic mutation that increases her risk of ovarian cancer, we had previously discussed risk reducing surgery.  We will plan to proceed with robotic BSO.  I discussed plan at the time of surgery.  Given her genetic mutation, I will survey the abdomen and pelvis for any abnormal findings.  Additionally, fluid will be collected at the start of surgery to test for any free-floating cancer cells.  If either tube or ovary is abnormal in  appearance, it will be sent to pathology for frozen section.  In the unlikely event that a malignancy is diagnosed at that time, then we discussed staging surgery which would include total hysterectomy, peritoneal biopsies, omentectomy, and lymph node sampling.  If an occult cancer is found on final pathology, then we will discuss need for any additional surgery at her follow-up visit.  Most likely, there will be no abnormal findings at the time  of surgery and I will proceed with removal of bilateral tubes and ovaries.  We discussed the option of concurrent hysterectomy.  From a cancer reduction standpoint, there is not an indication to remove her uterus at the time of her surgery.  Additionally, the patient is already on an aromatase inhibitor for her breast cancer.  Ultimately has decided that unless needed by findings at the time of surgery, she would like to proceed with BSO only.  I discussed the role of robotic assistance to help facilitate staging surgery if necessary.  We reviewed the plan for a robotic assisted bilateral salpingo-oophorectomy, possible staging including hysterectomy, possible laparotomy. The risks of surgery were discussed in detail and she understands these to include infection; wound separation; hernia; vaginal cuff separation, injury to adjacent organs such as bowel, bladder, blood vessels, ureters and nerves; bleeding which may require blood transfusion; anesthesia risk; thromboembolic events; possible death; unforeseen complications; possible need for re-exploration; medical complications such as heart attack, stroke, pleural effusion and pneumonia; and, if full lymphadenectomy is performed the risk of lymphedema and lymphocyst. The patient will receive DVT and antibiotic prophylaxis as indicated. She voiced a clear understanding. She had the opportunity to ask questions. Perioperative instructions were reviewed with her. Prescriptions for post-op medications were sent to her  pharmacy of choice.  36 minutes of total time was spent for this patient encounter, including preparation, face-to-face counseling with the patient and coordination of care, and documentation of the encounter.  Jeral Pinch, MD  Division of Gynecologic Oncology  Department of Obstetrics and Gynecology  [facility]

## 2022-06-06 NOTE — Progress Notes (Signed)
Patient here for follow up with Dr. Berline Lopes and for a pre-operative appointment prior to her scheduled surgery on July 03, 2022. She is scheduled for robotic assisted laparoscopic bilateral salpingo-oophorectomy, possible staging. She has her pre-admission testing appointment scheduled at the end of the month at [facility].  The surgery was discussed in detail.  See after visit summary for additional details. Visual aids used to discuss items related to surgery.   Discussed post-op pain management in detail including the aspects of the enhanced recovery pathway.  Advised her that a new prescription would be sent in for tramadol and it is only to be used for after her upcoming surgery.  We discussed the use of tylenol post-op and to monitor for a maximum of 4,000 mg in a 24 hour period.  Also prescribed sennakot to be used after surgery and to hold if having loose stools.  Discussed bowel regimen in detail.     Discussed the use of SCDs and measures to take at home to prevent DVT including frequent mobility.  Reportable signs and symptoms of DVT discussed. Post-operative instructions discussed and expectations for after surgery. Incisional care discussed as well including reportable signs and symptoms including erythema, drainage, wound separation.     10 minutes spent preparing information and with the patient.  Verbalizing understanding of material discussed. No needs or concerns voiced at the end of the visit.   Advised patient to call for any needs.  Advised that her post-operative medications had been prescribed and could be picked up at any time.    This appointment is included in the global surgical bundle as pre-operative teaching and has no charge.

## 2022-06-06 NOTE — H&P (View-Only) (Signed)
Gynecologic Oncology Return Clinic Visit  06/06/22  Reason for Visit: treatment planning  Treatment History: Oncology History  Malignant neoplasm of upper-outer quadrant of left breast in female, estrogen receptor positive (HCC)  11/06/2021 Mammogram   Bilateral screening mammogram shows left breast asymmetry which is indeterminate.  Additional views with possible ultrasound are recommended.  Left breast unilateral diagnostic mammogram confirmed a 1.6 x 1.1 cm upper outer aspect middle depth asymmetry with no other significant masses or calcifications.   11/12/2021 Pathology Results   Left breast needle core biopsy at 2:00 shows invasive ductal carcinoma, grade 2 prognostics ER 100% positive strong staining PR 50% positive moderate to strong staining Ki-67 of 5% and group 4 HER2 negative   11/19/2021 Initial Diagnosis   Malignant neoplasm of upper-outer quadrant of left breast in female, estrogen receptor positive (HCC)   11/20/2021 Cancer Staging   Staging form: Breast, AJCC 8th Edition - Clinical stage from 11/20/2021: Stage IA (cT1c, cN0, cM0, G2, ER+, PR+, HER2-) - Signed by Perkins, Alison Claire, PA-C on 11/20/2021 Method of lymph node assessment: Clinical Histologic grading system: 3 grade system   11/30/2021 Genetic Testing   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  [facility] report date is 11/30/2021.  [facility] CancerNext-Expanded +RNAinsight report date is 12/06/2021.   The CancerNext-Expanded gene panel offered by [facility] and includes sequencing, rearrangement, and RNA analysis for the following 77 genes: AIP, ALK, APC, ATM, AXIN2, BAP1, BARD1, BLM, BMPR1A, BRCA1, BRCA2, BRIP1, CDC73, CDH1, CDK4, CDKN1B, CDKN2A, CHEK2, CTNNA1, DICER1, FANCC, FH, FLCN, GALNT12, KIF1B, LZTR1, MAX, MEN1, MET, MLH1, MSH2, MSH3, MSH6, MUTYH, NBN, NF1, NF2, NTHL1, PALB2, PHOX2B, PMS2, POT1, PRKAR1A, PTCH1, PTEN, RAD51C, RAD51D, RB1, RECQL, RET, SDHA, SDHAF2, SDHB, SDHC, SDHD,  SMAD4, SMARCA4, SMARCB1, SMARCE1, STK11, SUFU, TMEM127, TP53, TSC1, TSC2, VHL and XRCC2 (sequencing and deletion/duplication); EGFR, EGLN1, HOXB13, KIT, MITF, PDGFRA, POLD1, and POLE (sequencing only); EPCAM and GREM1 (deletion/duplication only).     Endometrial biopsy (date difficult to determine) revealed scant atrophic endometrium. Pap smear was performed in 06/2019. This was negative for intraepithelial lesion or malignancy and high risk HPV negative.   Interval History: Doing well since her last visit with me.  Continues to experience menopausal symptoms.  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    Past Surgical History:  Procedure Laterality Date   BREAST LUMPECTOMY Left 01/29/2017   Procedure: LEFT BREAST LUMPECTOMY SUBAREOLAR DISSECTION ERAS PATHWAY;  Surgeon: Ingram, Haywood, MD;  Location: [facility];  Service: General;  Laterality: Left;  LMA   BREAST LUMPECTOMY WITH RADIOACTIVE SEED AND SENTINEL LYMPH NODE BIOPSY Left 12/06/2021   Procedure: LEFT BREAST LUMPECTOMY WITH RADIOACTIVE SEED AND SENTINEL LYMPH NODE BIOPSY;  Surgeon: Byerly, Faera, MD;  Location: [facility];  Service: General;  Laterality: Left;   BREAST LUMPECTOMY WITH RADIOACTIVE SEED LOCALIZATION Left 09/15/2015   Procedure: LEFT BREAST LUMPECTOMY WITH RADIOACTIVE SEED LOCALIZATION;  Surgeon: Haywood Ingram, MD;  Location:  SURGERY CENTER;  Service: General;  Laterality: Left;   CESAREAN SECTION     WISDOM TOOTH EXTRACTION      Family History  Problem Relation Age of Onset     Hypertension Mother    Breast cancer Mother 59       met d. 60   Hypertension Father     Diabetes Father    CAD Father    Brain cancer Maternal Aunt        dx 60s; d. 60s   Cancer Maternal Uncle        unknown type; ? lung;  dx 60s   Hypertension Maternal Grandmother    Diabetes Maternal Grandmother    Congestive Heart Failure Maternal Grandmother    Diabetes Paternal Grandmother    Diabetes Paternal Grandfather    Breast cancer Other        MGM's sister with breast cancer or other primary   Brain cancer Cousin        mat female cousin; dx 20s; d. 20s   Bladder Cancer Half-Brother        dx 50s; paternal half brother    Social History   Socioeconomic History   Marital status: Divorced    Spouse name: Not on file   Number of children: Not on file   Years of education: Not on file   Highest education level: Not on file  Occupational History   Occupation: works as an accountant  Tobacco Use   Smoking status: Never   Smokeless tobacco: Never  Vaping Use   Vaping Use: Never used  Substance and Sexual Activity   Alcohol use: Yes    Comment: occasional   Drug use: No   Sexual activity: Not Currently  Other Topics Concern   Not on file  Social History Narrative   Not on file   Social Determinants of Health   Financial Resource Strain: Low Risk  (11/21/2021)   Overall Financial Resource Strain (CARDIA)    Difficulty of Paying Living Expenses: Not very hard  Food Insecurity: No Food Insecurity (11/21/2021)   Hunger Vital Sign    Worried About Running Out of Food in the Last Year: Never true    Ran Out of Food in the Last Year: Never true  Transportation Needs: No Transportation Needs (11/21/2021)   PRAPARE - Transportation    Lack of Transportation (Medical): No    Lack of Transportation (Non-Medical): No  Physical Activity: Not on file  Stress: Not on file  Social Connections: Not on file    Current Medications:  Current Outpatient Medications:    ibuprofen (ADVIL) 800 MG tablet, Take 1 tablet (800 mg total) by mouth every 8 (eight) hours as needed  for moderate pain. For AFTER surgery only, Disp: 30 tablet, Rfl: 0   senna-docusate (SENOKOT-S) 8.6-50 MG tablet, Take 2 tablets by mouth at bedtime. For AFTER surgery, do not take if having diarrhea, Disp: 30 tablet, Rfl: 0   traMADol (ULTRAM) 50 MG tablet, Take 1 tablet (50 mg total) by mouth every 6 (six) hours as needed for severe pain. For AFTER surgery only, do not take and drive, Disp: 10 tablet, Rfl: 0   anastrozole (ARIMIDEX) 1 MG tablet, Take 1 tablet (1 mg total) by mouth daily., Disp: 90 tablet, Rfl: 3   aspirin 81 MG tablet, Take 81 mg by mouth daily., Disp: , Rfl:    atenolol (TENORMIN) 100 MG tablet, Take 100 mg by mouth daily., Disp: , Rfl:    atorvastatin (LIPITOR) 20 MG tablet, Take 20 mg by mouth daily., Disp: , Rfl:    Collagen-Vitamin C (COLLAGEN PLUS VITAMIN C PO), Take by mouth., Disp: , Rfl:    FARXIGA 5 MG   TABS tablet, Take 5 mg by mouth daily., Disp: , Rfl:    hydrochlorothiazide (HYDRODIURIL) 25 MG tablet, Take 25 mg by mouth daily., Disp: , Rfl:    loratadine (CLARITIN) 10 MG tablet, Take 10 mg by mouth daily as needed for allergies., Disp: , Rfl:    LUTEIN PO, Take by mouth., Disp: , Rfl:    metFORMIN (GLUCOPHAGE) 1000 MG tablet, Take 1,000 mg by mouth 2 (two) times daily., Disp: , Rfl:    Multiple Vitamins-Minerals (CENTRUM ADULTS PO), Take 1 tablet by mouth daily., Disp: , Rfl:    omega-3 acid ethyl esters (LOVAZA) 1 g capsule, Take by mouth 2 (two) times daily., Disp: , Rfl:    TIADYLT ER 240 MG 24 hr capsule, Take 240 mg by mouth daily., Disp: , Rfl:    triamcinolone ointment (KENALOG) 0.1 %, SMARTSIG:1 Topical Daily, Disp: , Rfl:   Review of Systems: + back pain, hot flashes Denies appetite changes, fevers, chills, fatigue, unexplained weight changes. Denies hearing loss, neck lumps or masses, mouth sores, ringing in ears or voice changes. Denies cough or wheezing.  Denies shortness of breath. Denies chest pain or palpitations. Denies leg swelling. Denies  abdominal distention, pain, blood in stools, constipation, diarrhea, nausea, vomiting, or early satiety. Denies pain with intercourse, dysuria, frequency, hematuria or incontinence. Denies pelvic pain, vaginal bleeding or vaginal discharge.   Denies joint pain or muscle pain/cramps. Denies itching, rash, or wounds. Denies dizziness, headaches, numbness or seizures. Denies swollen lymph nodes or glands, denies easy bruising or bleeding. Denies anxiety, depression, confusion, or decreased concentration.  Physical Exam: BP (!) 149/88 (BP Location: Right Arm, Patient Position: Sitting)   Pulse 73   Temp 98.6 F (37 C) (Oral)   Wt 142 lb 6.4 oz (64.6 kg)   SpO2 94%   BMI 22.98 kg/m  General: Alert, oriented, no acute distress. HEENT: Normocephalic, atraumatic, sclera anicteric. Chest: Unlabored breathing on room air.  Laboratory & Radiologic Studies: Pelvic ultrasound 03/13/22: IMPRESSION: 1. There is a 2.4 x 2.3 x 2.2 cm fibroid in the posterior left uterine fundus. 2. The endometrium is normal in thickness. 3. The left ovary is normal in appearance. The right ovary was not visualized. 4. No other abnormalities.  Assessment & Plan: Kari Torres is a 56 y.o. woman with estrogen receptor positive breast cancer as well as a pathogenic PALB2 mutation.   Patient is overall doing well.  She is scheduled for surgery in April and ready to proceed.  Please see my note for previous counseling.  Given her estrogen receptor positive breast cancer as well as her genetic mutation that increases her risk of ovarian cancer, we had previously discussed risk reducing surgery.  We will plan to proceed with robotic BSO.  I discussed plan at the time of surgery.  Given her genetic mutation, I will survey the abdomen and pelvis for any abnormal findings.  Additionally, fluid will be collected at the start of surgery to test for any free-floating cancer cells.  If either tube or ovary is abnormal in  appearance, it will be sent to pathology for frozen section.  In the unlikely event that a malignancy is diagnosed at that time, then we discussed staging surgery which would include total hysterectomy, peritoneal biopsies, omentectomy, and lymph node sampling.  If an occult cancer is found on final pathology, then we will discuss need for any additional surgery at her follow-up visit.  Most likely, there will be no abnormal findings at the time   of surgery and I will proceed with removal of bilateral tubes and ovaries.  We discussed the option of concurrent hysterectomy.  From a cancer reduction standpoint, there is not an indication to remove her uterus at the time of her surgery.  Additionally, the patient is already on an aromatase inhibitor for her breast cancer.  Ultimately has decided that unless needed by findings at the time of surgery, she would like to proceed with BSO only.  I discussed the role of robotic assistance to help facilitate staging surgery if necessary.  We reviewed the plan for a robotic assisted bilateral salpingo-oophorectomy, possible staging including hysterectomy, possible laparotomy. The risks of surgery were discussed in detail and she understands these to include infection; wound separation; hernia; vaginal cuff separation, injury to adjacent organs such as bowel, bladder, blood vessels, ureters and nerves; bleeding which may require blood transfusion; anesthesia risk; thromboembolic events; possible death; unforeseen complications; possible need for re-exploration; medical complications such as heart attack, stroke, pleural effusion and pneumonia; and, if full lymphadenectomy is performed the risk of lymphedema and lymphocyst. The patient will receive DVT and antibiotic prophylaxis as indicated. She voiced a clear understanding. She had the opportunity to ask questions. Perioperative instructions were reviewed with her. Prescriptions for post-op medications were sent to her  pharmacy of choice.  36 minutes of total time was spent for this patient encounter, including preparation, face-to-face counseling with the patient and coordination of care, and documentation of the encounter.  Valentine Barney, MD  Division of Gynecologic Oncology  Department of Obstetrics and Gynecology  [facility]   

## 2022-06-06 NOTE — Patient Instructions (Signed)
Preparing for your Surgery   Plan for surgery on July 03, 2022 with Dr. Jeral Pinch at [facility] will be scheduled for robotic assisted laparoscopic bilateral salpingo-oophorectomy (removal of both ovaries and fallopian tubes), possible staging.    Pre-operative Testing -You will receive a phone call from presurgical testing at [facility] to arrange for a pre-operative appointment and lab work.   -Bring your insurance card, copy of an advanced directive if applicable, medication list   -At that visit, you will be asked to sign a consent for a possible blood transfusion in case a transfusion becomes necessary during surgery.  The need for a blood transfusion is rare but having consent is a necessary part of your care.      -Do not take supplements such as fish oil (omega 3), red yeast rice, turmeric before your surgery. You want to avoid medications with aspirin in them including headache powders such as BC or Goody's), Excedrin migraine. STOP FISH OIL AT LEAST 10 DAYS BEFORE SURGERY.   YOU WILL NEED TO HOLD YOUR FARXIGA 3 DAYS BEFORE SURGERY. YOU CAN CONTINUE TAKING YOUR BABY ASPIRIN BUT DO NOT TAKE THE MORNING OF SURGERY.   Day Before Surgery at [place] will be asked to take in a light diet the day before surgery. You will be advised you can have clear liquids up until 3 hours before your surgery.     Eat a light diet the day before surgery.  Examples including soups, broths, toast, yogurt, mashed potatoes.  AVOID GAS PRODUCING FOODS AND BEVERAGES. Things to avoid include carbonated beverages (fizzy beverages, sodas), raw fruits and raw vegetables (uncooked), or beans.    If your bowels are filled with gas, your surgeon will have difficulty visualizing your pelvic organs which increases your surgical risks.   Your role in recovery Your role is to become active as soon as directed by your doctor, while still giving yourself time to heal.  Rest when you feel  tired. You will be asked to do the following in order to speed your recovery:   - Cough and breathe deeply. This helps to clear and expand your lungs and can prevent pneumonia after surgery.  - Victor. Do mild physical activity. Walking or moving your legs help your circulation and body functions return to normal. Do not try to get up or walk alone the first time after surgery.   -If you develop swelling on one leg or the other, pain in the back of your leg, redness/warmth in one of your legs, please call the office or go to the Emergency Room to have a doppler to rule out a blood clot. For shortness of breath, chest pain-seek care in the Emergency Room as soon as possible. - Actively manage your pain. Managing your pain lets you move in comfort. We will ask you to rate your pain on a scale of zero to 10. It is your responsibility to tell your doctor or nurse where and how much you hurt so your pain can be treated.   Special Considerations -If you are diabetic, you may be placed on insulin after surgery to have closer control over your blood sugars to promote healing and recovery.  This does not mean that you will be discharged on insulin.  If applicable, your oral antidiabetics will be resumed when you are tolerating a solid diet.   -Your final pathology results from surgery should be available around one week  after surgery and the results will be relayed to you when available.   -Dr. Lahoma Crocker is the surgeon that assists your GYN Oncologist with surgery.  If you end up staying the night, the next day after your surgery you will either see Dr. Berline Lopes, Dr. Ernestina Patches, or Dr. Lahoma Crocker.   -FMLA forms can be faxed to [phone] and please allow 5-7 business days for completion.   Pain Management After Surgery -You have been prescribed your pain medication and bowel regimen medications before surgery so that you can have these available when you are discharged  from the hospital. The pain medication is for use ONLY AFTER surgery and a new prescription will not be given.    -Make sure that you have Tylenol and Ibuprofen IF YOU ARE ABLE TO TAKE THESE MEDICATIONS at home to use on a regular basis after surgery for pain control. We recommend alternating the medications every hour to six hours since they work differently and are processed in the body differently for pain relief.   -Review the attached handout on narcotic use and their risks and side effects.    Bowel Regimen -You have been prescribed Sennakot-S to take nightly to prevent constipation especially if you are taking the narcotic pain medication intermittently.  It is important to prevent constipation and drink adequate amounts of liquids. You can stop taking this medication when you are not taking pain medication and you are back on your normal bowel routine.   Risks of Surgery Risks of surgery are low but include bleeding, infection, damage to surrounding structures, re-operation, blood clots, and very rarely death.     Blood Transfusion Information (For the consent to be signed before surgery)   We will be checking your blood type before surgery so in case of emergencies, we will know what type of blood you would need.                                             WHAT IS A BLOOD TRANSFUSION?   A transfusion is the replacement of blood or some of its parts. Blood is made up of multiple cells which provide different functions. Red blood cells carry oxygen and are used for blood loss replacement. White blood cells fight against infection. Platelets control bleeding. Plasma helps clot blood. Other blood products are available for specialized needs, such as hemophilia or other clotting disorders. BEFORE THE TRANSFUSION  Who gives blood for transfusions?  You may be able to donate blood to be used at a later date on yourself (autologous donation). Relatives can be asked to donate blood. This  is generally not any safer than if you have received blood from a stranger. The same precautions are taken to ensure safety when a relative's blood is donated. Healthy volunteers who are fully evaluated to make sure their blood is safe. This is blood bank blood. Transfusion therapy is the safest it has ever been in the practice of medicine. Before blood is taken from a donor, a complete history is taken to make sure that person has no history of diseases nor engages in risky social behavior (examples are intravenous drug use or sexual activity with multiple partners). The donor's travel history is screened to minimize risk of transmitting infections, such as malaria. The donated blood is tested for signs of infectious diseases, such as HIV and hepatitis. The  blood is then tested to be sure it is compatible with you in order to minimize the chance of a transfusion reaction. If you or a relative donates blood, this is often done in anticipation of surgery and is not appropriate for emergency situations. It takes many days to process the donated blood. RISKS AND COMPLICATIONS Although transfusion therapy is very safe and saves many lives, the main dangers of transfusion include:  Getting an infectious disease. Developing a transfusion reaction. This is an allergic reaction to something in the blood you were given. Every precaution is taken to prevent this. The decision to have a blood transfusion has been considered carefully by your caregiver before blood is given. Blood is not given unless the benefits outweigh the risks.   AFTER SURGERY INSTRUCTIONS   Return to work: 4-6 weeks if applicable   Activity: 1. Be up and out of the bed during the day.  Take a nap if needed.  You may walk up steps but be careful and use the hand rail.  Stair climbing will tire you more than you think, you may need to stop part way and rest.    2. No lifting or straining for 6 weeks over 10 pounds. No pushing, pulling,  straining for 6 weeks.   3. No driving for around 1 week(s).  Do not drive if you are taking narcotic pain medicine and make sure that your reaction time has returned.    4. You can shower as soon as the next day after surgery. Shower daily.  Use your regular soap and water (not directly on the incision) and pat your incision(s) dry afterwards; don't rub.  No tub baths or submerging your body in water until cleared by your surgeon. If you have the soap that was given to you by pre-surgical testing that was used before surgery, you do not need to use it afterwards because this can irritate your incisions.    5. No sexual activity and nothing in the vagina for 4 weeks.   6. You may experience a small amount of clear drainage from your incisions, which is normal.  If the drainage persists, increases, or changes color please call the office.   7. Do not use creams, lotions, or ointments such as neosporin on your incisions after surgery until advised by your surgeon because they can cause removal of the dermabond glue on your incisions.     8. You may experience vaginal spotting after surgery.  The spotting is normal but if you experience heavy bleeding, call our office.   9. Take Tylenol or ibuprofen first for pain if you are able to take these medications and only use narcotic pain medication for severe pain not relieved by the Tylenol or Ibuprofen.  Monitor your Tylenol intake to a max of 4,000 mg in a 24 hour period. You can alternate these medications after surgery.   Diet: 1. Low sodium Heart Healthy Diet is recommended but you are cleared to resume your normal (before surgery) diet after your procedure.   2. It is safe to use a laxative, such as Miralax or Colace, if you have difficulty moving your bowels. You have been prescribed Sennakot-S to take at bedtime every evening after surgery to keep bowel movements regular and to prevent constipation.     Wound Care: 1. Keep clean and dry.   Shower daily.   Reasons to call the Doctor: Fever - Oral temperature greater than 100.4 degrees Fahrenheit Foul-smelling vaginal discharge Difficulty urinating  Nausea and vomiting Increased pain at the site of the incision that is unrelieved with pain medicine. Difficulty breathing with or without chest pain New calf pain especially if only on one side Sudden, continuing increased vaginal bleeding with or without clots.   Contacts: For questions or concerns you should contact:   Dr. Jeral Pinch at [phone]   Joylene John, NP at [phone]   After Hours: call [phone] and have the GYN Oncologist paged/contacted (after 5 pm or on the weekends). You will speak with an after hours RN and let he or she know you have had surgery.   Messages sent via mychart are for non-urgent matters and are not responded to after hours so for urgent needs, please call the after hours number.

## 2022-06-06 NOTE — Patient Instructions (Addendum)
Preparing for your Surgery  Plan for surgery on July 03, 2022 with Dr. Jeral Pinch at [place] will be scheduled for robotic assisted laparoscopic bilateral salpingo-oophorectomy (removal of both ovaries and fallopian tubes), possible staging.   Pre-operative Testing -You will receive a phone call from presurgical testing at [facility] to arrange for a pre-operative appointment and lab work.  -Bring your insurance card, copy of an advanced directive if applicable, medication list  -At that visit, you will be asked to sign a consent for a possible blood transfusion in case a transfusion becomes necessary during surgery.  The need for a blood transfusion is rare but having consent is a necessary part of your care.     -Do not take supplements such as fish oil (omega 3), red yeast rice, turmeric before your surgery. You want to avoid medications with aspirin in them including headache powders such as BC or Goody's), Excedrin migraine. STOP FISH OIL AT LEAST 10 DAYS BEFORE SURGERY.  YOU WILL NEED TO HOLD YOUR FARXIGA 3 DAYS BEFORE SURGERY. YOU CAN CONTINUE TAKING YOUR BABY ASPIRIN BUT DO NOT TAKE THE MORNING OF SURGERY.  Day Before Surgery at [place] will be asked to take in a light diet the day before surgery. You will be advised you can have clear liquids up until 3 hours before your surgery.    Eat a light diet the day before surgery.  Examples including soups, broths, toast, yogurt, mashed potatoes.  AVOID GAS PRODUCING FOODS AND BEVERAGES. Things to avoid include carbonated beverages (fizzy beverages, sodas), raw fruits and raw vegetables (uncooked), or beans.   If your bowels are filled with gas, your surgeon will have difficulty visualizing your pelvic organs which increases your surgical risks.  Your role in recovery Your role is to become active as soon as directed by your doctor, while still giving yourself time to heal.  Rest when you feel tired. You  will be asked to do the following in order to speed your recovery:  - Cough and breathe deeply. This helps to clear and expand your lungs and can prevent pneumonia after surgery.  - [place]. Do mild physical activity. Walking or moving your legs help your circulation and body functions return to normal. Do not try to get up or walk alone the first time after surgery.   -If you develop swelling on one leg or the other, pain in the back of your leg, redness/warmth in one of your legs, please call the office or go to the Emergency Room to have a doppler to rule out a blood clot. For shortness of breath, chest pain-seek care in the Emergency Room as soon as possible. - Actively manage your pain. Managing your pain lets you move in comfort. We will ask you to rate your pain on a scale of zero to 10. It is your responsibility to tell your doctor or nurse where and how much you hurt so your pain can be treated.  Special Considerations -If you are diabetic, you may be placed on insulin after surgery to have closer control over your blood sugars to promote healing and recovery.  This does not mean that you will be discharged on insulin.  If applicable, your oral antidiabetics will be resumed when you are tolerating a solid diet.  -Your final pathology results from surgery should be available around one week after surgery and the results will be relayed to you when available.  -  Dr. Lahoma Crocker is the surgeon that assists your GYN Oncologist with surgery.  If you end up staying the night, the next day after your surgery you will either see Dr. Berline Lopes, Dr. Ernestina Patches, or Dr. Lahoma Crocker.  -FMLA forms can be faxed to [phone] and please allow 5-7 business days for completion.  Pain Management After Surgery -You have been prescribed your pain medication and bowel regimen medications before surgery so that you can have these available when you are discharged from the hospital.  The pain medication is for use ONLY AFTER surgery and a new prescription will not be given.   -Make sure that you have Tylenol and Ibuprofen IF YOU ARE ABLE TO TAKE THESE MEDICATIONS at home to use on a regular basis after surgery for pain control. We recommend alternating the medications every hour to six hours since they work differently and are processed in the body differently for pain relief.  -Review the attached handout on narcotic use and their risks and side effects.   Bowel Regimen -You have been prescribed Sennakot-S to take nightly to prevent constipation especially if you are taking the narcotic pain medication intermittently.  It is important to prevent constipation and drink adequate amounts of liquids. You can stop taking this medication when you are not taking pain medication and you are back on your normal bowel routine.  Risks of Surgery Risks of surgery are low but include bleeding, infection, damage to surrounding structures, re-operation, blood clots, and very rarely death.   Blood Transfusion Information (For the consent to be signed before surgery)  We will be checking your blood type before surgery so in case of emergencies, we will know what type of blood you would need.                                            WHAT IS A BLOOD TRANSFUSION?  A transfusion is the replacement of blood or some of its parts. Blood is made up of multiple cells which provide different functions. Red blood cells carry oxygen and are used for blood loss replacement. White blood cells fight against infection. Platelets control bleeding. Plasma helps clot blood. Other blood products are available for specialized needs, such as hemophilia or other clotting disorders. BEFORE THE TRANSFUSION  Who gives blood for transfusions?  You may be able to donate blood to be used at a later date on yourself (autologous donation). Relatives can be asked to donate blood. This is generally not any safer  than if you have received blood from a stranger. The same precautions are taken to ensure safety when a relative's blood is donated. Healthy volunteers who are fully evaluated to make sure their blood is safe. This is blood bank blood. Transfusion therapy is the safest it has ever been in the practice of medicine. Before blood is taken from a donor, a complete history is taken to make sure that person has no history of diseases nor engages in risky social behavior (examples are intravenous drug use or sexual activity with multiple partners). The donor's travel history is screened to minimize risk of transmitting infections, such as malaria. The donated blood is tested for signs of infectious diseases, such as HIV and hepatitis. The blood is then tested to be sure it is compatible with you in order to minimize the chance of a transfusion reaction. If you or  a relative donates blood, this is often done in anticipation of surgery and is not appropriate for emergency situations. It takes many days to process the donated blood. RISKS AND COMPLICATIONS Although transfusion therapy is very safe and saves many lives, the main dangers of transfusion include:  Getting an infectious disease. Developing a transfusion reaction. This is an allergic reaction to something in the blood you were given. Every precaution is taken to prevent this. The decision to have a blood transfusion has been considered carefully by your caregiver before blood is given. Blood is not given unless the benefits outweigh the risks.  AFTER SURGERY INSTRUCTIONS  Return to work: 4-6 weeks if applicable  Activity: 1. Be up and out of the bed during the day.  Take a nap if needed.  You may walk up steps but be careful and use the hand rail.  Stair climbing will tire you more than you think, you may need to stop part way and rest.   2. No lifting or straining for 6 weeks over 10 pounds. No pushing, pulling, straining for 6 weeks.  3. No  driving for around 1 week(s).  Do not drive if you are taking narcotic pain medicine and make sure that your reaction time has returned.   4. You can shower as soon as the next day after surgery. Shower daily.  Use your regular soap and water (not directly on the incision) and pat your incision(s) dry afterwards; don't rub.  No tub baths or submerging your body in water until cleared by your surgeon. If you have the soap that was given to you by pre-surgical testing that was used before surgery, you do not need to use it afterwards because this can irritate your incisions.   5. No sexual activity and nothing in the vagina for 4 weeks.  6. You may experience a small amount of clear drainage from your incisions, which is normal.  If the drainage persists, increases, or changes color please call the office.  7. Do not use creams, lotions, or ointments such as neosporin on your incisions after surgery until advised by your surgeon because they can cause removal of the dermabond glue on your incisions.    8. You may experience vaginal spotting after surgery.  The spotting is normal but if you experience heavy bleeding, call our office.  9. Take Tylenol or ibuprofen first for pain if you are able to take these medications and only use narcotic pain medication for severe pain not relieved by the Tylenol or Ibuprofen.  Monitor your Tylenol intake to a max of 4,000 mg in a 24 hour period. You can alternate these medications after surgery.  Diet: 1. Low sodium Heart Healthy Diet is recommended but you are cleared to resume your normal (before surgery) diet after your procedure.  2. It is safe to use a laxative, such as Miralax or Colace, if you have difficulty moving your bowels. You have been prescribed Sennakot-S to take at bedtime every evening after surgery to keep bowel movements regular and to prevent constipation.    Wound Care: 1. Keep clean and dry.  Shower daily.  Reasons to call the  Doctor: Fever - Oral temperature greater than 100.4 degrees Fahrenheit Foul-smelling vaginal discharge Difficulty urinating Nausea and vomiting Increased pain at the site of the incision that is unrelieved with pain medicine. Difficulty breathing with or without chest pain New calf pain especially if only on one side Sudden, continuing increased vaginal bleeding with or  without clots.   Contacts: For questions or concerns you should contact:  Dr. Jeral Pinch at [phone]  Joylene John, NP at [phone]  After Hours: call [phone] and have the GYN Oncologist paged/contacted (after 5 pm or on the weekends). You will speak with an after hours RN and let he or she know you have had surgery.  Messages sent via mychart are for non-urgent matters and are not responded to after hours so for urgent needs, please call the after hours number.

## 2022-06-11 ENCOUNTER — Telehealth: Payer: Self-pay | Admitting: Adult Health

## 2022-06-11 NOTE — Telephone Encounter (Signed)
Reached out to reschedule patient, per IB left voicemail.

## 2022-06-17 ENCOUNTER — Ambulatory Visit: Payer: Managed Care, Other (non HMO) | Attending: General Surgery

## 2022-06-17 VITALS — Wt 145.5 lb

## 2022-06-17 DIAGNOSIS — Z483 Aftercare following surgery for neoplasm: Secondary | ICD-10-CM | POA: Insufficient documentation

## 2022-06-17 NOTE — Therapy (Signed)
OUTPATIENT PHYSICAL THERAPY SOZO SCREENING NOTE   Patient Name: Kari Torres MRN: [id number] DOB:17-Jun-1965, 57 y.o., female Today's Date: 06/17/2022  PCP: Alroy Dust, L.Marlou Sa, MD REFERRING PROVIDER: Stark Klein, MD   PT End of Session - 06/17/22 1544     Visit Number 7   # unchanged due to screen only   PT Start Time [id number]    PT Stop Time 1546    PT Time Calculation (min) 5 min    Activity Tolerance Patient tolerated treatment well    Behavior During Therapy 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   Past Surgical History:  Procedure Laterality Date   BREAST LUMPECTOMY Left 01/29/2017   Procedure: LEFT BREAST LUMPECTOMY SUBAREOLAR DISSECTION ERAS PATHWAY;  Surgeon: Fanny Skates, MD;  Location: [place];  Service: General;  Laterality: Left;  LMA   BREAST LUMPECTOMY WITH RADIOACTIVE SEED AND SENTINEL LYMPH NODE BIOPSY Left 12/06/2021   Procedure: LEFT BREAST LUMPECTOMY WITH RADIOACTIVE SEED AND SENTINEL LYMPH NODE BIOPSY;  Surgeon: Stark Klein, MD;  Location: [place];  Service: General;  Laterality: Left;   BREAST LUMPECTOMY WITH RADIOACTIVE SEED LOCALIZATION Left 09/15/2015   Procedure: LEFT BREAST LUMPECTOMY WITH RADIOACTIVE SEED LOCALIZATION;  Surgeon: Fanny Skates, MD;  Location: [place];  Service: General;  Laterality: Left;   [place] EXTRACTION     Patient Active Problem List   Diagnosis Date Noted   ER+ (estrogen receptor positive status) 03/08/2022   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[place]) [id number]   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    Genetic testing 11/30/2021   Family history of breast cancer 11/21/2021   Family history of brain cancer 11/21/2021   Family history of bladder cancer 11/21/2021   Malignant neoplasm of upper-outer quadrant of left breast in female, estrogen receptor positive ([place]) 11/19/2021   Bloody discharge from left nipple 01/29/2017   Uncontrolled diabetes mellitus type 2 without complications [id number]   Benign essential hypertension 06/22/2015   Hypercholesterolemia 06/22/2015   Allergic rhinitis due to pollen 06/22/2015   Chest pain 06/22/2015    REFERRING DIAG: left breast cancer at risk for lymphedema  THERAPY DIAG: Aftercare following surgery for neoplasm  PERTINENT HISTORY: Patient was diagnosed on 11/12/2021 with left grade 2 invasive ductal carcinoma breast cancer. It measures 1.6 cm and is located in the upper outer quadrant. It is ER/PR positive and HER2 negative with a Ki67 of 5%. She is s/p left lumpectomy with SLNB on 12/06/2021 with 0/3 LN. She  does not require chemo, but will have radiation.She also has diabetes and hypertension I ddi well with my ROM initially, and lost some when the hematoma happened. I have been doing the exercises.   PRECAUTIONS: left UE Lymphedema risk, None  SUBJECTIVE: Pt returns for her 3 month L-Dex screen.   PAIN:  Are you having pain? No  SOZO SCREENING: Patient was assessed today using the SOZO machine to determine the lymphedema index score. This was compared to her baseline score. It was determined that she is within the recommended range  when compared to her baseline and no further action is needed at this time. She will continue SOZO screenings. These are done every 3 months for 2 years post operatively followed by every 6 months for 2 years, and then annually.   L-DEX FLOWSHEETS - 06/17/22 1500       L-DEX LYMPHEDEMA SCREENING   Measurement Type Unilateral    L-DEX MEASUREMENT EXTREMITY Upper Extremity    POSITION  Standing    DOMINANT SIDE  Right    At Risk Side Left    BASELINE SCORE (UNILATERAL) 0.6    L-DEX SCORE (UNILATERAL) -0.3    VALUE CHANGE (UNILAT) -0.9               Otelia Limes, PTA 06/17/2022, 3:46 PM

## 2022-06-18 NOTE — Progress Notes (Addendum)
Anesthesia Review:  PCP: DR Donnie Coffin - Requested most recent ov note  They are to fax. OV note of 02/01/22 on chart.  Cardiologist : none  Chest x-ray : EKG : 12/04/21  Echo : Stress test: 2017  Cardiac Cath :  Activity level: can do a flgiht of stairs without difficutly  Sleep Study/ CPAP : none  Fasting Blood Sugar :      / Checks Blood Sugar -- times a day:   Blood Thinner/ Instructions /Last Dose: ASA / Instructions/ Last Dose :   81 mg aspirin    DM- type2- checks glucose in the am 2 times per week  Hgb A1c-  06/20/22-  6.9  Farxiga - last dose on 4//08/2022    PT was 10 minutes late for preop appt.

## 2022-06-19 NOTE — Patient Instructions (Addendum)
SURGICAL WAITING ROOM VISITATION  Patients having surgery or a procedure may have no more than 2 support people in the waiting area - these visitors may rotate.    Children under the age of 35 must have an adult with them who is not the patient.  Due to an increase in RSV and influenza rates and associated hospitalizations, children ages 81 and under may not visit patients in [place].  If the patient needs to stay at the hospital during part of their recovery, the visitor guidelines for inpatient rooms apply. Pre-op nurse will coordinate an appropriate time for 1 support person to accompany patient in pre-op.  This support person may not rotate.    Please refer to the [facility] website for the visitor guidelines for Inpatients (after your surgery is over and you are in a regular room).       Your procedure is scheduled on:  07/03/2022    Report to [facility] Main Entrance    Report to admitting at   [phone]   Call this number if you have problems the morning of surgery [phone]   Do not eat food :After Midnight.            Eat a light diet the day before surgery.              Avoid gas producing foods    After Midnight you may have the following liquids until __ 0530____ AM  DAY OF SURGERY  Water Non-Citrus Juices (without pulp, NO RED-Apple, White grape, White cranberry) Black Coffee (NO MILK/CREAM OR CREAMERS, sugar ok)  Clear Tea (NO MILK/CREAM OR CREAMERS, sugar ok) regular and decaf                             Plain Jell-O (NO RED)                                           Fruit ices (not with fruit pulp, NO RED)                                     Popsicles (NO RED)                                                               Sports drinks like Gatorade (NO RED)                             If you have questions, please contact your surgeon's office.       Oral Hygiene is also important to reduce your risk of infection.                                     Remember - BRUSH YOUR TEETH THE MORNING OF SURGERY WITH YOUR REGULAR TOOTHPASTE  DENTURES WILL BE REMOVED PRIOR TO SURGERY PLEASE DO NOT APPLY "Poly grip" OR ADHESIVES!!!   Do NOT  smoke after Midnight   Take these medicines the morning of surgery with A SIP OF WATER:  Atenolol, claritin if needed, Diltiazem   DO NOT TAKE ANY ORAL DIABETIC MEDICATIONS DAY OF YOUR SURGERY  Bring CPAP mask and tubing day of surgery.                              You may not have any metal on your body including hair pins, jewelry, and body piercing             Do not wear make-up, lotions, powders, perfumes/cologne, or deodorant  Do not wear nail polish including gel and S&S, artificial/acrylic nails, or any other type of covering on natural nails including finger and toenails. If you have artificial nails, gel coating, etc. that needs to be removed by a nail salon please have this removed prior to surgery or surgery may need to be canceled/ delayed if the surgeon/ anesthesia feels like they are unable to be safely monitored.   Do not shave  48 hours prior to surgery.               Men may shave face and neck.   Do not bring valuables to the hospital. [place].   Contacts, glasses, dentures or bridgework may not be worn into surgery.   Bring small overnight bag day of surgery.   DO NOT [place]. PHARMACY WILL DISPENSE MEDICATIONS LISTED ON YOUR MEDICATION LIST TO YOU DURING YOUR ADMISSION [place]!    Patients discharged on the day of surgery will not be allowed to drive home.  Someone NEEDS to stay with you for the first 24 hours after anesthesia.   Special Instructions: Bring a copy of your healthcare power of attorney and living will documents the day of surgery if you haven't scanned them before.              Please read over the following fact sheets you were given: IF [place] [phone]   If you received a COVID test during your pre-op visit  it is requested that you wear a mask when out in public, stay away from anyone that may not be feeling well and notify your surgeon if you develop symptoms. If you test positive for Covid or have been in contact with anyone that has tested positive in the last 10 days please notify you surgeon.    [place] - Preparing for Surgery Before surgery, you can play an important role.  Because skin is not sterile, your skin needs to be as free of germs as possible.  You can reduce the number of germs on your skin by washing with CHG (chlorahexidine gluconate) soap before surgery.  CHG is an antiseptic cleaner which kills germs and bonds with the skin to continue killing germs even after washing. Please DO NOT use if you have an allergy to CHG or antibacterial soaps.  If your skin becomes reddened/irritated stop using the CHG and inform your nurse when you arrive at Short Stay. Do not shave (including legs and underarms) for at least 48 hours prior to the first CHG shower.  You may shave your face/neck. Please follow these instructions carefully:  1.  Shower with CHG Soap the  night before surgery and the  morning of Surgery.  2.  If you choose to wash your hair, wash your hair first as usual with your  normal  shampoo.  3.  After you shampoo, rinse your hair and body thoroughly to remove the  shampoo.                           4.  Use CHG as you would any other liquid soap.  You can apply chg directly  to the skin and wash                       Gently with a scrungie or clean washcloth.  5.  Apply the CHG Soap to your body ONLY FROM THE NECK DOWN.   Do not use on face/ open                           Wound or open sores. Avoid contact with eyes, ears mouth and genitals (private parts).                       Wash face,  Genitals (private parts) with your normal soap.             6.  Wash  thoroughly, paying special attention to the area where your surgery  will be performed.  7.  Thoroughly rinse your body with warm water from the neck down.  8.  DO NOT shower/wash with your normal soap after using and rinsing off  the CHG Soap.                9.  Pat yourself dry with a clean towel.            10.  Wear clean pajamas.            11.  Place clean sheets on your bed the night of your first shower and do not  sleep with pets. Day of Surgery : Do not apply any lotions/deodorants the morning of surgery.  Please wear clean clothes to the hospital/surgery center.  FAILURE TO FOLLOW THESE INSTRUCTIONS MAY RESULT IN THE CANCELLATION OF YOUR SURGERY PATIENT SIGNATURE_________________________________  NURSE SIGNATURE__________________________________  ________________________________________________________________________

## 2022-06-20 ENCOUNTER — Encounter (HOSPITAL_COMMUNITY): Payer: Self-pay

## 2022-06-20 ENCOUNTER — Encounter (HOSPITAL_COMMUNITY)
Admission: RE | Admit: 2022-06-20 | Discharge: 2022-06-20 | Disposition: A | Discharge status: Home / Self Care | Payer: Managed Care, Other (non HMO) | Source: Ambulatory Visit | Attending: Gynecologic Oncology | Admitting: Gynecologic Oncology

## 2022-06-20 ENCOUNTER — Other Ambulatory Visit: Payer: Self-pay

## 2022-06-20 VITALS — BP 147/89 | HR 73 | Temp 98.4°F | Resp 16 | Ht 66.0 in | Wt 139.0 lb

## 2022-06-20 DIAGNOSIS — C50412 Malignant neoplasm of upper-outer quadrant of left female breast: Secondary | ICD-10-CM | POA: Diagnosis not present

## 2022-06-20 DIAGNOSIS — Z1501 Genetic susceptibility to malignant neoplasm of breast: Secondary | ICD-10-CM | POA: Diagnosis not present

## 2022-06-20 DIAGNOSIS — Z170 Estrogen receptor positive status [ER+]: Secondary | ICD-10-CM | POA: Diagnosis not present

## 2022-06-20 DIAGNOSIS — Z01818 Encounter for other preprocedural examination: Secondary | ICD-10-CM | POA: Insufficient documentation

## 2022-06-20 HISTORY — DX: Cardiac murmur, unspecified: R01.1

## 2022-06-20 LAB — COMPREHENSIVE METABOLIC PANEL
ALT: 22 U/L (ref 0–44)
AST: 22 U/L (ref 15–41)
Albumin: 4.5 g/dL (ref 3.5–5.0)
Alkaline Phosphatase: 56 U/L (ref 38–126)
Anion gap: 9 (ref 5–15)
BUN: 22 mg/dL — ABNORMAL HIGH (ref 6–20)
CO2: 26 mmol/L (ref 22–32)
Calcium: 9.8 mg/dL (ref 8.9–10.3)
Chloride: 99 mmol/L (ref 98–111)
Creatinine, Ser: 0.77 mg/dL (ref 0.44–1.00)
GFR, Estimated: >60 mL/min (ref >60)
Glucose, Bld: 165 mg/dL — ABNORMAL HIGH (ref 70–99)
Potassium: 4.3 mmol/L (ref 3.5–5.1)
Sodium: 134 mmol/L — ABNORMAL LOW (ref 135–145)
Total Bilirubin: 0.8 mg/dL (ref 0.3–1.2)
Total Protein: 7.7 g/dL (ref 6.5–8.1)

## 2022-06-20 LAB — CBC
HCT: 41.7 % (ref 36.0–46.0)
Hemoglobin: 13.3 g/dL (ref 12.0–15.0)
MCH: 29.1 pg (ref 26.0–34.0)
MCHC: 31.9 g/dL (ref 30.0–36.0)
MCV: 91.2 fL (ref 80.0–100.0)
Platelets: 315 10*3/uL (ref 150–400)
RBC: 4.57 MIL/uL (ref 3.87–5.11)
RDW: 12.8 % (ref 11.5–15.5)
WBC: 4.2 10*3/uL (ref 4.0–10.5)
nRBC: 0 % (ref 0.0–0.2)

## 2022-06-20 LAB — TYPE AND SCREEN
ABO/RH(D): O POS
Antibody Screen: NEGATIVE

## 2022-06-20 LAB — GLUCOSE, CAPILLARY: Glucose-Capillary: 164 mg/dL — ABNORMAL HIGH (ref 70–99)

## 2022-06-21 LAB — HEMOGLOBIN A1C
Hgb A1c MFr Bld: 6.9 % — ABNORMAL HIGH (ref 4.8–5.6)
Mean Plasma Glucose: 151 mg/dL

## 2022-06-25 ENCOUNTER — Telehealth: Payer: Self-pay | Admitting: *Deleted

## 2022-06-25 ENCOUNTER — Telehealth: Payer: Self-pay | Admitting: Adult Health

## 2022-06-25 NOTE — Telephone Encounter (Signed)
Patient called to reschedule 4/9 appointment. Patient rescheduled and notified.

## 2022-06-25 NOTE — Telephone Encounter (Signed)
Patient called and stated "I wanted to ask Melissa APP instead of getting miralax for before surgery; can I just use the senokot-s that was prescribed?" Explained that the message would be given to [facility] APP

## 2022-06-25 NOTE — Telephone Encounter (Signed)
Per Joylene John APP [facility] that the patient cn take the senokot-s

## 2022-07-02 ENCOUNTER — Telehealth: Payer: Self-pay

## 2022-07-02 ENCOUNTER — Inpatient Hospital Stay: Payer: Managed Care, Other (non HMO) | Admitting: Adult Health

## 2022-07-02 NOTE — Telephone Encounter (Signed)
Another attempt to reach patient regarding pre-op call for surgery tomorrow 4/10

## 2022-07-02 NOTE — Telephone Encounter (Signed)
Attempted to reach patient to check in with her pre-operatively. Unable to reach patient. Left message requesting return call.   ?

## 2022-07-02 NOTE — Anesthesia Preprocedure Evaluation (Signed)
Anesthesia Evaluation  Patient identified by MRN, date of birth, ID band Patient awake    Reviewed: Allergy & Precautions, H&P , NPO status , Patient's Chart, lab work & pertinent test results  Airway Mallampati: II  TM Distance: >3 FB Neck ROM: Full    Dental no notable dental hx. (+) Teeth Intact, Dental Advisory Given   Pulmonary neg pulmonary ROS   Pulmonary exam normal breath sounds clear to auscultation       Cardiovascular hypertension, Pt. on home beta blockers and Pt. on medications negative cardio ROS Normal cardiovascular exam Rhythm:Regular Rate:Normal     Neuro/Psych negative neurological ROS  negative psych ROS   GI/Hepatic negative GI ROS, Neg liver ROS,,,Lab Results      Component                Value               Date                      CREATININE               0.77                06/20/2022                BUN                      22 (H)              06/20/2022                NA                       134 (L)             06/20/2022                K                        4.3                 06/20/2022                   Endo/Other  negative endocrine ROSdiabetes, Type 2    Renal/GU negative Renal ROS  negative genitourinary   Musculoskeletal negative musculoskeletal ROS (+)    Abdominal   Peds negative pediatric ROS (+)  Hematology negative hematology ROS (+) Lab Results      Component                Value               Date                      WBC                      4.2                 06/20/2022                HGB                      13.3                06/20/2022  HCT                      41.7                06/20/2022                       PLT                      315                 06/20/2022              Anesthesia Other Findings All: iodine, shellfish  L breast CA  Reproductive/Obstetrics negative OB ROS                              Anesthesia Physical Anesthesia Plan  ASA: 3  Anesthesia Plan: General   Post-op Pain Management: Lidocaine infusion*, Ketamine IV* and Tylenol PO (pre-op)*   Induction: Intravenous  PONV Risk Score and Plan: 4 or greater and Midazolam, Ondansetron, Treatment may vary due to age or medical condition and Dexamethasone  Airway Management Planned: Oral ETT  Additional Equipment: None  Intra-op Plan:   Post-operative Plan: Extubation in OR  Informed Consent: I have reviewed the patients History and Physical, chart, labs and discussed the procedure including the risks, benefits and alternatives for the proposed anesthesia with the patient or authorized representative who has indicated his/her understanding and acceptance.     Dental advisory given  Plan Discussed with:   Anesthesia Plan Comments:         Anesthesia Quick Evaluation

## 2022-07-03 ENCOUNTER — Encounter (HOSPITAL_COMMUNITY)
Admission: RE | Disposition: A | Discharge status: Home / Self Care | Payer: Self-pay | Source: Home / Self Care | Attending: Gynecologic Oncology

## 2022-07-03 ENCOUNTER — Ambulatory Visit (HOSPITAL_BASED_OUTPATIENT_CLINIC_OR_DEPARTMENT_OTHER): Payer: Managed Care, Other (non HMO) | Admitting: Anesthesiology

## 2022-07-03 ENCOUNTER — Ambulatory Visit (HOSPITAL_COMMUNITY): Payer: Managed Care, Other (non HMO) | Admitting: Physician Assistant

## 2022-07-03 ENCOUNTER — Other Ambulatory Visit: Payer: Self-pay

## 2022-07-03 ENCOUNTER — Ambulatory Visit (HOSPITAL_COMMUNITY)
Admission: RE | Admit: 2022-07-03 | Discharge: 2022-07-03 | Disposition: A | Discharge status: Home / Self Care | Payer: Managed Care, Other (non HMO) | Attending: Gynecologic Oncology | Admitting: Gynecologic Oncology

## 2022-07-03 ENCOUNTER — Encounter (HOSPITAL_COMMUNITY): Payer: Self-pay | Admitting: Gynecologic Oncology

## 2022-07-03 DIAGNOSIS — C50412 Malignant neoplasm of upper-outer quadrant of left female breast: Secondary | ICD-10-CM | POA: Diagnosis not present

## 2022-07-03 DIAGNOSIS — Z170 Estrogen receptor positive status [ER+]: Secondary | ICD-10-CM

## 2022-07-03 DIAGNOSIS — Z4002 Encounter for prophylactic removal of ovary: Secondary | ICD-10-CM

## 2022-07-03 DIAGNOSIS — Z7984 Long term (current) use of oral hypoglycemic drugs: Secondary | ICD-10-CM | POA: Insufficient documentation

## 2022-07-03 DIAGNOSIS — Z1501 Genetic susceptibility to malignant neoplasm of breast: Secondary | ICD-10-CM

## 2022-07-03 DIAGNOSIS — Z1509 Genetic susceptibility to other malignant neoplasm: Secondary | ICD-10-CM

## 2022-07-03 DIAGNOSIS — Z1502 Genetic susceptibility to malignant neoplasm of ovary: Secondary | ICD-10-CM

## 2022-07-03 DIAGNOSIS — C50919 Malignant neoplasm of unspecified site of unspecified female breast: Secondary | ICD-10-CM

## 2022-07-03 DIAGNOSIS — Z79899 Other long term (current) drug therapy: Secondary | ICD-10-CM | POA: Diagnosis not present

## 2022-07-03 DIAGNOSIS — I10 Essential (primary) hypertension: Secondary | ICD-10-CM

## 2022-07-03 DIAGNOSIS — Z148 Genetic carrier of other disease: Secondary | ICD-10-CM | POA: Diagnosis not present

## 2022-07-03 DIAGNOSIS — E119 Type 2 diabetes mellitus without complications: Secondary | ICD-10-CM | POA: Insufficient documentation

## 2022-07-03 DIAGNOSIS — Z01818 Encounter for other preprocedural examination: Secondary | ICD-10-CM

## 2022-07-03 HISTORY — PX: ROBOTIC ASSISTED SALPINGO OOPHERECTOMY: SHX6082

## 2022-07-03 LAB — GLUCOSE, CAPILLARY
Glucose-Capillary: 180 mg/dL — ABNORMAL HIGH (ref 70–99)
Glucose-Capillary: 165 mg/dL — ABNORMAL HIGH (ref 70–99)

## 2022-07-03 LAB — ABO/RH: ABO/RH(D): O POS

## 2022-07-03 SURGERY — SALPINGO-OOPHORECTOMY, ROBOT-ASSISTED
Anesthesia: General | Laterality: Bilateral

## 2022-07-03 MED ORDER — SUGAMMADEX SODIUM 500 MG/5ML IV SOLN
INTRAVENOUS | Status: DC | PRN
  Administered 2022-07-03: 400 mg via INTRAVENOUS

## 2022-07-03 MED ORDER — LIDOCAINE HCL (PF) 2 % IJ SOLN
INTRAMUSCULAR | Status: DC | PRN
  Administered 2022-07-03: .5 mg/kg/h

## 2022-07-03 MED ORDER — LIDOCAINE HCL (CARDIAC) PF 100 MG/5ML IV SOSY
PREFILLED_SYRINGE | INTRAVENOUS | Status: DC | PRN
  Administered 2022-07-03: 100 mg via INTRAVENOUS

## 2022-07-03 MED ORDER — LACTATED RINGERS IR SOLN
Status: DC | PRN
  Administered 2022-07-03: 1000 mL

## 2022-07-03 MED ORDER — SCOPOLAMINE 1 MG/3DAYS TD PT72
1.0000 | MEDICATED_PATCH | TRANSDERMAL | Status: DC
  Administered 2022-07-03: 1.5 mg via TRANSDERMAL
  Filled 2022-07-03: qty 1

## 2022-07-03 MED ORDER — CHLORHEXIDINE GLUCONATE 0.12 % MT SOLN
15.0000 mL | Freq: Once | OROMUCOSAL | Status: AC
  Administered 2022-07-03: 15 mL via OROMUCOSAL

## 2022-07-03 MED ORDER — FENTANYL CITRATE (PF) 250 MCG/5ML IJ SOLN
INTRAMUSCULAR | Status: AC
  Filled 2022-07-03: qty 5

## 2022-07-03 MED ORDER — HYDROMORPHONE HCL 1 MG/ML IJ SOLN
INTRAMUSCULAR | Status: AC
  Administered 2022-07-03: 0.5 mg via INTRAVENOUS
  Filled 2022-07-03: qty 1

## 2022-07-03 MED ORDER — OXYCODONE HCL 5 MG PO TABS: 5.0000 mg | ORAL_TABLET | Freq: Once | ORAL | Status: DC | PRN

## 2022-07-03 MED ORDER — LACTATED RINGERS IV SOLN: INTRAVENOUS | Status: DC

## 2022-07-03 MED ORDER — KETAMINE HCL 10 MG/ML IJ SOLN
INTRAMUSCULAR | Status: DC | PRN
  Administered 2022-07-03: 20 mg via INTRAVENOUS

## 2022-07-03 MED ORDER — STERILE WATER FOR IRRIGATION IR SOLN
Status: DC | PRN
  Administered 2022-07-03: 1000 mL

## 2022-07-03 MED ORDER — FENTANYL CITRATE (PF) 100 MCG/2ML IJ SOLN
INTRAMUSCULAR | Status: DC | PRN
  Administered 2022-07-03 (×2): 100 ug via INTRAVENOUS

## 2022-07-03 MED ORDER — OXYCODONE HCL 5 MG/5ML PO SOLN: 5.0000 mg | Freq: Once | ORAL | Status: DC | PRN

## 2022-07-03 MED ORDER — KETAMINE HCL 50 MG/5ML IJ SOSY
PREFILLED_SYRINGE | INTRAMUSCULAR | Status: AC
  Filled 2022-07-03: qty 5

## 2022-07-03 MED ORDER — GABAPENTIN 300 MG PO CAPS
300.0000 mg | ORAL_CAPSULE | ORAL | Status: AC
  Administered 2022-07-03: 300 mg via ORAL
  Filled 2022-07-03: qty 1

## 2022-07-03 MED ORDER — PROPOFOL 10 MG/ML IV BOLUS
INTRAVENOUS | Status: AC
  Filled 2022-07-03: qty 20

## 2022-07-03 MED ORDER — HYDROMORPHONE HCL 1 MG/ML IJ SOLN
0.2500 mg | INTRAMUSCULAR | Status: DC | PRN
  Administered 2022-07-03: 0.5 mg via INTRAVENOUS

## 2022-07-03 MED ORDER — ORAL CARE MOUTH RINSE: 15.0000 mL | Freq: Once | OROMUCOSAL | Status: AC

## 2022-07-03 MED ORDER — MIDAZOLAM HCL 5 MG/5ML IJ SOLN
INTRAMUSCULAR | Status: DC | PRN
  Administered 2022-07-03: 2 mg via INTRAVENOUS

## 2022-07-03 MED ORDER — ACETAMINOPHEN 500 MG PO TABS
1000.0000 mg | ORAL_TABLET | ORAL | Status: AC
  Administered 2022-07-03: 1000 mg via ORAL
  Filled 2022-07-03: qty 2

## 2022-07-03 MED ORDER — ONDANSETRON HCL 4 MG/2ML IJ SOLN
INTRAMUSCULAR | Status: DC | PRN
  Administered 2022-07-03: 4 mg via INTRAVENOUS

## 2022-07-03 MED ORDER — BUPIVACAINE LIPOSOME 1.3 % IJ SUSP
INTRAMUSCULAR | Status: AC
  Filled 2022-07-03: qty 20

## 2022-07-03 MED ORDER — BUPIVACAINE HCL 0.25 % IJ SOLN
INTRAMUSCULAR | Status: DC | PRN
  Administered 2022-07-03: 31 mL

## 2022-07-03 MED ORDER — ROCURONIUM BROMIDE 10 MG/ML (PF) SYRINGE
PREFILLED_SYRINGE | INTRAVENOUS | Status: AC
  Filled 2022-07-03: qty 10

## 2022-07-03 MED ORDER — LACTATED RINGERS IV SOLN: INTRAVENOUS | Status: DC | PRN

## 2022-07-03 MED ORDER — ONDANSETRON HCL 4 MG/2ML IJ SOLN: 4.0000 mg | Freq: Once | INTRAMUSCULAR | Status: DC | PRN

## 2022-07-03 MED ORDER — KETOROLAC TROMETHAMINE 30 MG/ML IJ SOLN: 30.0000 mg | Freq: Once | INTRAMUSCULAR | Status: DC | PRN

## 2022-07-03 MED ORDER — MIDAZOLAM HCL 2 MG/2ML IJ SOLN
INTRAMUSCULAR | Status: AC
  Filled 2022-07-03: qty 2

## 2022-07-03 MED ORDER — DEXAMETHASONE SODIUM PHOSPHATE 4 MG/ML IJ SOLN: 4.0000 mg | INTRAMUSCULAR | Status: DC

## 2022-07-03 MED ORDER — CELECOXIB 200 MG PO CAPS
200.0000 mg | ORAL_CAPSULE | ORAL | Status: AC
  Administered 2022-07-03: 200 mg via ORAL
  Filled 2022-07-03: qty 1

## 2022-07-03 MED ORDER — DEXAMETHASONE SODIUM PHOSPHATE 10 MG/ML IJ SOLN
INTRAMUSCULAR | Status: DC | PRN
  Administered 2022-07-03: 6 mg via INTRAVENOUS

## 2022-07-03 MED ORDER — DEXAMETHASONE SODIUM PHOSPHATE 10 MG/ML IJ SOLN
INTRAMUSCULAR | Status: AC
  Filled 2022-07-03: qty 1

## 2022-07-03 MED ORDER — LIDOCAINE HCL (PF) 2 % IJ SOLN
INTRAMUSCULAR | Status: AC
  Filled 2022-07-03: qty 15

## 2022-07-03 MED ORDER — HEPARIN SODIUM (PORCINE) 5000 UNIT/ML IJ SOLN
5000.0000 [IU] | INTRAMUSCULAR | Status: AC
  Administered 2022-07-03: 5000 [IU] via SUBCUTANEOUS
  Filled 2022-07-03: qty 1

## 2022-07-03 MED ORDER — PROPOFOL 10 MG/ML IV BOLUS
INTRAVENOUS | Status: DC | PRN
  Administered 2022-07-03: 150 mg via INTRAVENOUS

## 2022-07-03 MED ORDER — BUPIVACAINE HCL 0.25 % IJ SOLN
INTRAMUSCULAR | Status: AC
  Filled 2022-07-03: qty 1

## 2022-07-03 MED ORDER — ONDANSETRON HCL 4 MG/2ML IJ SOLN
INTRAMUSCULAR | Status: AC
  Filled 2022-07-03: qty 2

## 2022-07-03 MED ORDER — ROCURONIUM BROMIDE 100 MG/10ML IV SOLN
INTRAVENOUS | Status: DC | PRN
  Administered 2022-07-03: 50 mg via INTRAVENOUS

## 2022-07-03 SURGICAL SUPPLY — 90 items
ADH SKN CLS APL DERMABOND .7 (GAUZE/BANDAGES/DRESSINGS) ×1
AGENT HMST KT MTR STRL THRMB (HEMOSTASIS)
APL ESCP 34 STRL LF DISP (HEMOSTASIS)
APPLICATOR SURGIFLO ENDO (HEMOSTASIS) IMPLANT
BAG COUNTER SPONGE SURGICOUNT (BAG) IMPLANT
BAG LAPAROSCOPIC 12 15 PORT 16 (BASKET) IMPLANT
BAG RETRIEVAL 12/15 (BASKET)
BAG SPNG CNTER NS LX DISP (BAG)
BLADE SURG SZ10 CARB STEEL (BLADE) IMPLANT
COVER BACK TABLE 60X90IN (DRAPES) ×1 IMPLANT
COVER TIP SHEARS 8 DVNC (MISCELLANEOUS) ×1 IMPLANT
COVER TIP SHEARS 8MM DA VINCI (MISCELLANEOUS) ×1
DERMABOND ADVANCED .7 DNX12 (GAUZE/BANDAGES/DRESSINGS) ×1 IMPLANT
DRAPE ARM DVNC X/XI (DISPOSABLE) ×4 IMPLANT
DRAPE COLUMN DVNC XI (DISPOSABLE) ×1 IMPLANT
DRAPE DA VINCI XI ARM (DISPOSABLE) ×4
DRAPE DA VINCI XI COLUMN (DISPOSABLE) ×1
DRAPE SHEET LG 3/4 BI-LAMINATE (DRAPES) ×1 IMPLANT
DRAPE SURG IRRIG POUCH 19X23 (DRAPES) ×1 IMPLANT
DRIVER NDL MEGA 8 DVNC XI (INSTRUMENTS) ×2 IMPLANT
DRIVER NDLE MEGA DVNC XI (INSTRUMENTS) IMPLANT
DRIVER NEEDLE MEGA DA VINCI XI (INSTRUMENTS)
DRSG OPSITE POSTOP 4X6 (GAUZE/BANDAGES/DRESSINGS) IMPLANT
DRSG OPSITE POSTOP 4X8 (GAUZE/BANDAGES/DRESSINGS) IMPLANT
ELECT PENCIL ROCKER SW 15FT (MISCELLANEOUS) IMPLANT
ELECT REM PT RETURN 15FT ADLT (MISCELLANEOUS) ×1 IMPLANT
FORCEPS BIPOLAR XI FENES DVNC (FORCEP) ×1
FORCEPS BPLR FENES DVNC XI (FORCEP) ×1 IMPLANT
FORCEPS PROGRASP DVNC XI (FORCEP) ×1 IMPLANT
FORCEPS XI PROGRASP DA VINCI (FORCEP)
GAUZE 4X4 16PLY ~~LOC~~+RFID DBL (SPONGE) ×2 IMPLANT
GLOVE BIO SURGEON STRL SZ 6 (GLOVE) ×4 IMPLANT
GLOVE BIO SURGEON STRL SZ 6.5 (GLOVE) ×1 IMPLANT
GOWN STRL REUS W/ TWL LRG LVL3 (GOWN DISPOSABLE) ×4 IMPLANT
GOWN STRL REUS W/TWL LRG LVL3 (GOWN DISPOSABLE) ×4
GRASPER SUT TROCAR 14GX15 (MISCELLANEOUS) IMPLANT
HIBICLENS CHG 4% 4OZ BTL (MISCELLANEOUS) ×2 IMPLANT
HOLDER FOLEY CATH W/STRAP (MISCELLANEOUS) IMPLANT
IRRIG SUCT STRYKERFLOW 2 WTIP (MISCELLANEOUS) ×1
IRRIGATION SUCT STRKRFLW 2 WTP (MISCELLANEOUS) ×1 IMPLANT
KIT PROCEDURE DA VINCI SI (MISCELLANEOUS)
KIT PROCEDURE DVNC SI (MISCELLANEOUS) IMPLANT
KIT TURNOVER KIT A (KITS) IMPLANT
LIGASURE IMPACT 36 18CM CVD LR (INSTRUMENTS) IMPLANT
MANIPULATOR ADVINCU DEL 3.0 PL (MISCELLANEOUS) IMPLANT
MANIPULATOR ADVINCU DEL 3.5 PL (MISCELLANEOUS) IMPLANT
MANIPULATOR UTERINE 4.5 ZUMI (MISCELLANEOUS) IMPLANT
NDL HYPO 21X1.5 SAFETY (NEEDLE) ×1 IMPLANT
NDL SPNL 18GX3.5 QUINCKE PK (NEEDLE) IMPLANT
NEEDLE HYPO 21X1.5 SAFETY (NEEDLE) ×1 IMPLANT
NEEDLE SPNL 18GX3.5 QUINCKE PK (NEEDLE) IMPLANT
OBTURATOR OPTICAL STANDARD 8MM (TROCAR) ×1
OBTURATOR OPTICAL STND 8 DVNC (TROCAR) ×1
OBTURATOR OPTICALSTD 8 DVNC (TROCAR) ×1 IMPLANT
PACK ROBOT GYN CUSTOM WL (TRAY / TRAY PROCEDURE) ×1 IMPLANT
PAD POSITIONING PINK XL (MISCELLANEOUS) ×1 IMPLANT
PORT ACCESS TROCAR AIRSEAL 12 (TROCAR) IMPLANT
SCISSORS MNPLR CVD DVNC XI (INSTRUMENTS) ×1 IMPLANT
SCISSORS XI MNPLR CVD DVNC (INSTRUMENTS) ×1
SEAL UNIV 5-12 XI (MISCELLANEOUS) ×3 IMPLANT
SEAL XI UNIVERSAL 5-12 (MISCELLANEOUS) ×3
SET TRI-LUMEN FLTR TB AIRSEAL (TUBING) ×1 IMPLANT
SPIKE FLUID TRANSFER (MISCELLANEOUS) ×1 IMPLANT
SPONGE T-LAP 18X18 ~~LOC~~+RFID (SPONGE) IMPLANT
SURGIFLO W/THROMBIN 8M KIT (HEMOSTASIS) IMPLANT
SUT MNCRL AB 4-0 PS2 18 (SUTURE) IMPLANT
SUT PDS AB 1 TP1 96 (SUTURE) IMPLANT
SUT V-LOC 180 0-0 GS22 (SUTURE) IMPLANT
SUT VIC AB 0 CT1 27 (SUTURE)
SUT VIC AB 0 CT1 27XBRD ANTBC (SUTURE) IMPLANT
SUT VIC AB 2-0 CT1 27 (SUTURE)
SUT VIC AB 2-0 CT1 TAPERPNT 27 (SUTURE) IMPLANT
SUT VICRYL 0 27 CT2 27 ABS (SUTURE) ×1 IMPLANT
SUT VICRYL 4-0 PS2 18IN ABS (SUTURE) ×2 IMPLANT
SUT VLOC 180 0 9IN  GS21 (SUTURE)
SUT VLOC 180 0 9IN GS21 (SUTURE) IMPLANT
SYR 10ML LL (SYRINGE) IMPLANT
SYS BAG RETRIEVAL 10MM (BASKET)
SYS RETRIEVAL 5MM INZII UNIV (BASKET) ×1
SYS WOUND ALEXIS 18CM MED (MISCELLANEOUS)
SYSTEM BAG RETRIEVAL 10MM (BASKET) IMPLANT
SYSTEM RETRIEVL 5MM INZII UNIV (BASKET) IMPLANT
SYSTEM WOUND ALEXIS 18CM MED (MISCELLANEOUS) IMPLANT
TOWEL OR NON WOVEN STRL DISP B (DISPOSABLE) IMPLANT
TRAP SPECIMEN MUCUS 40CC (MISCELLANEOUS) IMPLANT
TRAY FOLEY MTR SLVR 16FR STAT (SET/KITS/TRAYS/PACK) ×1 IMPLANT
TROCAR PORT AIRSEAL 5X120 (TROCAR) IMPLANT
UNDERPAD 30X36 HEAVY ABSORB (UNDERPADS AND DIAPERS) ×2 IMPLANT
WATER STERILE IRR 1000ML POUR (IV SOLUTION) ×1 IMPLANT
YANKAUER SUCT BULB TIP 10FT TU (MISCELLANEOUS) IMPLANT

## 2022-07-03 NOTE — Anesthesia Procedure Notes (Signed)
Procedure Name: Intubation Date/Time: 07/03/2022 9:22 AM  Performed by: Shanon Payor, CRNAPre-anesthesia Checklist: Patient identified, Emergency Drugs available, Suction available, Patient being monitored and Timeout performed Patient Re-evaluated:Patient Re-evaluated prior to induction Oxygen Delivery Method: Circle system utilized Preoxygenation: Pre-oxygenation with 100% oxygen Induction Type: IV induction Ventilation: Mask ventilation without difficulty Laryngoscope Size: Mac and 3 Grade View: Grade I Tube type: Oral Tube size: 7.0 mm Number of attempts: 1 Airway Equipment and Method: Stylet Placement Confirmation: ETT inserted through vocal cords under direct vision, positive ETCO2, CO2 detector and breath sounds checked- equal and bilateral Secured at: 22 cm Tube secured with: Tape Dental Injury: Teeth and Oropharynx as per pre-operative assessment

## 2022-07-03 NOTE — Transfer of Care (Signed)
Immediate Anesthesia Transfer of Care Note  Patient: Kari Torres  Procedure(s) Performed: XI ROBOTIC ASSISTED SALPINGO OOPHORECTOMY (Bilateral)  Patient Location: PACU  Anesthesia Type:General  Level of Consciousness: awake, drowsy, and patient cooperative  Airway & Oxygen Therapy: Patient Spontanous Breathing and Patient connected to face mask oxygen  Post-op Assessment: Report given to RN, Post -op Vital signs reviewed and stable, and Patient moving all extremities X 4  Post vital signs: Reviewed and stable  Last Vitals:  Vitals Value Taken Time  BP 151/84 07/03/22 1053  Temp    Pulse 61 07/03/22 1056  Resp 17 07/03/22 1056  SpO2 100 % 07/03/22 1056  Vitals shown include unvalidated device data.  Last Pain:  Vitals:   07/03/22 0705  TempSrc:   PainSc: 0-No pain         Complications: No notable events documented.

## 2022-07-03 NOTE — Discharge Instructions (Signed)
AFTER SURGERY INSTRUCTIONS   Return to work: 4-6 weeks if applicable   Activity: 1. Be up and out of the bed during the day.  Take a nap if needed.  You may walk up steps but be careful and use the hand rail.  Stair climbing will tire you more than you think, you may need to stop part way and rest.    2. No lifting or straining for 6 weeks over 10 pounds. No pushing, pulling, straining for 6 weeks.   3. No driving for around 1 week(s).  Do not drive if you are taking narcotic pain medicine and make sure that your reaction time has returned.    4. You can shower as soon as the next day after surgery. Shower daily.  Use your regular soap and water (not directly on the incision) and pat your incision(s) dry afterwards; don't rub.  No tub baths or submerging your body in water until cleared by your surgeon. If you have the soap that was given to you by pre-surgical testing that was used before surgery, you do not need to use it afterwards because this can irritate your incisions.    5. No sexual activity and nothing in the vagina for 4 weeks.   6. You may experience a small amount of clear drainage from your incisions, which is normal.  If the drainage persists, increases, or changes color please call the office.   7. Do not use creams, lotions, or ointments such as neosporin on your incisions after surgery until advised by your surgeon because they can cause removal of the dermabond glue on your incisions.     8. You may experience vaginal spotting after surgery.  The spotting is normal but if you experience heavy bleeding, call our office.   9. Take Tylenol or ibuprofen first for pain if you are able to take these medications and only use narcotic pain medication for severe pain not relieved by the Tylenol or Ibuprofen.  Monitor your Tylenol intake to a max of 4,000 mg in a 24 hour period. You can alternate these medications after surgery.   Diet: 1. Low sodium Heart Healthy Diet is recommended  but you are cleared to resume your normal (before surgery) diet after your procedure.   2. It is safe to use a laxative, such as Miralax or Colace, if you have difficulty moving your bowels. You have been prescribed Sennakot-S to take at bedtime every evening after surgery to keep bowel movements regular and to prevent constipation.     Wound Care: 1. Keep clean and dry.  Shower daily.   Reasons to call the Doctor: Fever - Oral temperature greater than 100.4 degrees Fahrenheit Foul-smelling vaginal discharge Difficulty urinating Nausea and vomiting Increased pain at the site of the incision that is unrelieved with pain medicine. Difficulty breathing with or without chest pain New calf pain especially if only on one side Sudden, continuing increased vaginal bleeding with or without clots.   Contacts: For questions or concerns you should contact:   Dr. Eugene Garnet at [phone]   Warner Mccreedy, NP at [phone]   After Hours: call [phone] and have the GYN Oncologist paged/contacted (after 5 pm or on the weekends). You will speak with an after hours RN and let he or she know you have had surgery.   Messages sent via mychart are for non-urgent matters and are not responded to after hours so for urgent needs, please call the after hours number.

## 2022-07-03 NOTE — Op Note (Signed)
OPERATIVE NOTE  Pre-operative Diagnosis: PALB2 mutation, ER+ breast cancer  Post-operative Diagnosis: same  Operation: Robotic-assisted laparoscopic bilateral salpingo-oophorectomy   Surgeon: Eugene Garnet MD  Assistant Surgeon: Antionette Char MD (an MD assistant was necessary for tissue manipulation, management of robotic instrumentation, retraction and positioning due to the complexity of the case and hospital policies).   Anesthesia: GET  Urine Output: 50 cc  Operative Findings:  Small mobile uterus on EUA. On intra-abdominal entry, normal upper abdominal survey. Normal omentum, small and large bowel. Uterus 6-8 cm, normal in appearance. Normal appearing bilateral tubes and ovaries. No ascites. No peritoneal lesions noted.  Estimated Blood Loss:  25 cc      Total IV Fluids: see I&O flowsheet         Specimens: bilateral tubes and ovaries, pelvic washings         Complications:  None apparent; patient tolerated the procedure well.         Disposition: PACU - hemodynamically stable.  Procedure Details  The patient was seen in the Holding Room. The risks, benefits, complications, treatment options, and expected outcomes were discussed with the patient.  The patient concurred with the proposed plan, giving informed consent.  The site of surgery properly noted/marked. The patient was identified as Kari Torres and the procedure verified as a Robotic-assisted bilateral salpingo-oophorectomy with any other indicated procedures.   After induction of anesthesia, the patient was draped and prepped in the usual sterile manner. Patient was placed in supine position after anesthesia and draped and prepped in the usual sterile manner as follows: Her arms were tucked to her side with all appropriate precautions.  The patient was secured to the bed with padding and tape across her chest.  The patient was placed in the semi-lithotomy position in [place] stirrups.  The perineum and vagina were  prepped with ChloraPrep . The patient's abdomen was prepped with ChloraPrep and then she was draped after the prep had been allowed to dry for 3 minutes.  A Time Out was held and the above information confirmed.  The urethra was prepped with ChloraPrep . Foley catheter was placed.   A sterile speculum was placed in the vagina.  The cervix was grasped with a single-tooth tenaculum. A Hulka manipulator was placed.  A pneum occluder balloon was placed over the manipulator.  OG tube placement was confirmed and to suction.   Next, a 5 mm skin incision was made 1 cm below the subcostal margin in the midclavicular line.  The 5 mm Optiview port and scope was used for direct entry.  Opening pressure was under 10 mm CO2.  The abdomen was insufflated and the findings were noted as above.   At this point and all points during the procedure, the patient's intra-abdominal pressure did not exceed 15 mmHg. Next, an 8 mm skin incision was made superior to the umbilicus and a right and left port were placed about 8 cm lateral to the robot port on the right and left side.  The 5 mm assist trocar was exchanged for a 8 mm airseal port. All ports were placed under direct visualization.  The patient was placed in steep Trendelenburg.  Bowel was folded away into the upper abdomen.  The robot was docked in the normal manner.  The right and left peritoneum were opened parallel to the IP ligament to open the retroperitoneal spaces bilaterally. The round ligaments were preserved. The ureter was noted to be on the medial leaf of the broad  ligament.  The peritoneum above the ureter was incised and stretched and the infundibulopelvic ligament was skeletonized, cauterized and cut.  The utero-ovarian ligament and fallopian tube were skeletonized, cauterized and transected just lateral to the uterine fundus, freeing the adnexa. Both adnexa were placed in an endocatch bag.  Irrigation was used and excellent hemostasis was achieved.  At this  point in the procedure was completed.  Robotic instruments were removed under direct visulaization.  The robot was undocked. The endocatch bag was removed along with bilateral adnexa. The fascia at the 8 mm suprapubic port (used for bag insertion and specimen removal) was closed with 0 Vicryl using a PMI fascial closure device.  The subcuticular tissue was closed with 4-0 Vicryl and the skin was closed with 4-0 Monocryl in a subcuticular manner.  Dermabond was applied.    The vagina was swabbed with minimal bleeding noted. Foley catheter was removed.  All sponge, lap and needle counts were correct x  3.   The patient was transferred to the recovery room in stable condition.  Eugene Garnet, MD

## 2022-07-03 NOTE — Anesthesia Postprocedure Evaluation (Signed)
Anesthesia Post Note  Patient: Kari Torres  Procedure(s) Performed: XI ROBOTIC ASSISTED SALPINGO OOPHORECTOMY (Bilateral)     Patient location during evaluation: PACU Anesthesia Type: General Level of consciousness: awake and alert Pain management: pain level controlled Vital Signs Assessment: post-procedure vital signs reviewed and stable Respiratory status: spontaneous breathing, nonlabored ventilation, respiratory function stable and patient connected to nasal cannula oxygen Cardiovascular status: blood pressure returned to baseline and stable Postop Assessment: no apparent nausea or vomiting Anesthetic complications: no  No notable events documented.  Last Vitals:  Vitals:   07/03/22 1200 07/03/22 1203  BP: 129/80   Pulse: (!) 57   Resp: 11   Temp:  (!) 36.4 C  SpO2: 99%     Last Pain:  Vitals:   07/03/22 1140  TempSrc:   PainSc: 5                  Trevor Iha

## 2022-07-03 NOTE — Interval H&P Note (Signed)
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  The various methods of treatment have been discussed with the patient and family. After consideration of risks, benefits and other options for treatment, the patient has consented to  Procedure(s): XI ROBOTIC ASSISTED SALPINGO OOPHORECTOMY (Bilateral) as a surgical intervention.  The patient's history has been reviewed, patient examined, no change in status, stable for surgery.  I have reviewed the patient's chart and labs.  Questions were answered to the patient's satisfaction.     Carver Fila

## 2022-07-04 ENCOUNTER — Telehealth: Payer: Self-pay

## 2022-07-04 ENCOUNTER — Encounter (HOSPITAL_COMMUNITY): Payer: Self-pay | Admitting: Gynecologic Oncology

## 2022-07-04 NOTE — Telephone Encounter (Addendum)
LM for patient to call back to the office to discuss how she is doing after her surgery yesterday. 

## 2022-07-05 LAB — SURGICAL PATHOLOGY

## 2022-07-05 LAB — CYTOLOGY - NON PAP

## 2022-07-05 NOTE — Telephone Encounter (Signed)
LM for patient to call back to the office to discuss how she is doing after her surgery 07-03-22

## 2022-08-01 ENCOUNTER — Encounter: Payer: Self-pay | Admitting: Gynecologic Oncology

## 2022-08-01 ENCOUNTER — Inpatient Hospital Stay: Payer: Managed Care, Other (non HMO) | Attending: Hematology and Oncology | Admitting: Gynecologic Oncology

## 2022-08-01 VITALS — BP 140/86 | HR 72 | Temp 98.5°F | Ht 65.75 in | Wt 143.0 lb

## 2022-08-01 DIAGNOSIS — Z7189 Other specified counseling: Secondary | ICD-10-CM

## 2022-08-01 DIAGNOSIS — N952 Postmenopausal atrophic vaginitis: Secondary | ICD-10-CM

## 2022-08-01 DIAGNOSIS — Z90710 Acquired absence of both cervix and uterus: Secondary | ICD-10-CM

## 2022-08-01 DIAGNOSIS — C50919 Malignant neoplasm of unspecified site of unspecified female breast: Secondary | ICD-10-CM

## 2022-08-01 DIAGNOSIS — Z148 Genetic carrier of other disease: Secondary | ICD-10-CM

## 2022-08-01 DIAGNOSIS — Z170 Estrogen receptor positive status [ER+]: Secondary | ICD-10-CM

## 2022-08-01 DIAGNOSIS — Z90722 Acquired absence of ovaries, bilateral: Secondary | ICD-10-CM

## 2022-08-01 MED ORDER — ESTROGENS CONJUGATED 0.625 MG/GM VA CREA
1.0000 | TOPICAL_CREAM | VAGINAL | 1 refills | Status: AC
Start: 2022-08-02 — End: ?

## 2022-08-01 NOTE — Progress Notes (Signed)
Gynecologic Oncology Return Clinic Visit  08/01/22  Reason for Visit: follow-up after surgery  Treatment History: Oncology History  Malignant neoplasm of upper-outer quadrant of left breast in female, estrogen receptor positive (HCC)  11/06/2021 Mammogram   Bilateral screening mammogram shows left breast asymmetry which is indeterminate.  Additional views with possible ultrasound are recommended.  Left breast unilateral diagnostic mammogram confirmed a 1.6 x 1.1 cm upper outer aspect middle depth asymmetry with no other significant masses or calcifications.   11/12/2021 Pathology Results   Left breast needle core biopsy at 2:00 shows invasive ductal carcinoma, grade 2 prognostics ER 100% positive strong staining PR 50% positive moderate to strong staining Ki-67 of 5% and group 4 HER2 negative   11/19/2021 Initial Diagnosis   Malignant neoplasm of upper-outer quadrant of left breast in female, estrogen receptor positive (HCC)   11/20/2021 Cancer Staging   Staging form: Breast, AJCC 8th Edition - Clinical stage from 11/20/2021: Stage IA (cT1c, cN0, cM0, G2, ER+, PR+, HER2-) - Signed by Ronny Bacon, PA-C on 11/20/2021 Method of lymph node assessment: Clinical Histologic grading system: 3 grade system   11/30/2021 Genetic Testing   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  [facility] BRCAP[facility]us report date is 11/30/2021.  [facility] CancerNext-Expanded +RNAinsight report date is 12/06/2021.   The CancerNext-Expanded gene panel offered by [facility] and includes sequencing, rearrangement, and RNA analysis for the following 77 genes: AIP, ALK, APC, ATM, AXIN2, BAP1, BARD1, BLM, BMPR1A, BRCA1, BRCA2, BRIP1, CDC73, CDH1, CDK4, CDKN1B, CDKN2A, CHEK2, CTNNA1, DICER1, FANCC, FH, FLCN, GALNT12, KIF1B, LZTR1, MAX, MEN1, MET, MLH1, MSH2, MSH3, MSH6, MUTYH, NBN, NF1, NF2, NTHL1, PALB2, PHOX2B, PMS2, POT1, PRKAR1A, PTCH1, PTEN, RAD51C, RAD51D, RB1, RECQL, RET, SDHA, SDHAF2, SDHB, SDHC, SDHD,  SMAD4, SMARCA4, SMARCB1, SMARCE1, STK11, SUFU, TMEM127, TP53, TSC1, TSC2, VHL and XRCC2 (sequencing and deletion/duplication); EGFR, EGLN1, HOXB13, KIT, MITF, PDGFRA, POLD1, and POLE (sequencing only); EPCAM and GREM1 (deletion/duplication only).      Interval History: Patient is doing very well.  Denies any abdominal or pelvic pain.  Reports baseline bowel and bladder function.  Had minimal spotting for 1-2 days after surgery, denies any further bleeding.  Is having some hot flashes, very bothered by vaginal dryness.  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    Past Surgical History:  Procedure Laterality Date   BREAST LUMPECTOMY Left 01/29/2017   Procedure: LEFT BREAST LUMPECTOMY SUBAREOLAR DISSECTION ERAS PATHWAY;  Surgeon: Claud Kelp, MD;  Location: [facility];  Service: General;  Laterality: Left;  LMA   BREAST LUMPECTOMY WITH RADIOACTIVE SEED AND SENTINEL LYMPH NODE BIOPSY Left 12/06/2021   Procedure: LEFT BREAST LUMPECTOMY WITH RADIOACTIVE SEED AND SENTINEL LYMPH NODE BIOPSY;  Surgeon: Almond Lint, MD;  Location: [facility];  Service: General;  Laterality: Left;   BREAST LUMPECTOMY WITH RADIOACTIVE SEED LOCALIZATION Left 09/15/2015   Procedure: LEFT BREAST LUMPECTOMY WITH RADIOACTIVE SEED LOCALIZATION;  Surgeon: Claud Kelp, MD;  Location: [facility];  Service: General;  Laterality: Left;   CESAREAN SECTION     ROBOTIC ASSISTED SALPINGO OOPHERECTOMY Bilateral 07/03/2022   Procedure: XI ROBOTIC ASSISTED SALPINGO  OOPHORECTOMY;  Surgeon: Carver Fila, MD;  Location: [facility];  Service: Gynecology;   Laterality: Bilateral;   WISDOM TOOTH EXTRACTION      Family History  Problem Relation Age of Onset   Hypertension Mother    Breast cancer Mother 56       met d. 81   Hypertension Father    Diabetes Father    CAD Father    Brain cancer Maternal Aunt        dx 18s; d. 42s   Cancer Maternal Uncle        unknown type; ? lung;  dx 60s   Hypertension Maternal Grandmother    Diabetes Maternal Grandmother    Congestive Heart Failure Maternal Grandmother    Diabetes Paternal Grandmother    Diabetes Paternal Grandfather    Breast cancer Other        MGM's sister with breast cancer or other primary   Brain cancer Cousin        mat female cousin; dx 27s; d. 85s   Bladder Cancer Half-Brother        dx 62s; paternal half brother    Social History   Socioeconomic History   Marital status: Divorced    Spouse name: Not on file   Number of children: Not on file   Years of education: Not on file   Highest education level: Not on file  Occupational History   Occupation: works as an Airline pilot  Tobacco Use   Smoking status: Never   Smokeless tobacco: Never  Building services engineer Use: Never used  Substance and Sexual Activity   Alcohol use: Yes    Comment: occasional   Drug use: No   Sexual activity: Not Currently  Other Topics Concern   Not on file  Social History Narrative   Not on file   Social Determinants of Health   Financial Resource Strain: Low Risk  (11/21/2021)   Overall Financial Resource Strain (CARDIA)    Difficulty of Paying Living Expenses: Not very hard  Food Insecurity: No Food Insecurity (11/21/2021)   Hunger Vital Sign    Worried About Running Out of Food in the Last Year: Never true    Ran Out of Food in the Last Year: Never true  Transportation Needs: No Transportation Needs (11/21/2021)   PRAPARE - Administrator, Civil Service (Medical): No    Lack of Transportation (Non-Medical): No  Physical Activity: Not on file  Stress: Not on file   Social Connections: Not on file    Current Medications:  Current Outpatient Medications:    [START ON 08/02/2022] conjugated estrogens (PREMARIN) vaginal cream, Place 1 Applicatorful vaginally 3 (three) times a week. Throw out applicator. Place small amount of cream on your finger tip and insert into the vagina 3 times a week at night., Disp: 42.5 g, Rfl: 1   anastrozole (ARIMIDEX) 1 MG tablet, Take 1 tablet (1 mg total) by mouth daily., Disp: 90 tablet, Rfl: 3   aspirin 81 MG tablet, Take 81 mg by mouth 2 (two) times a week., Disp: , Rfl:    atenolol (TENORMIN) 100 MG tablet, Take 100 mg by mouth daily., Disp: , Rfl:    atorvastatin (LIPITOR) 20 MG tablet, Take 20 mg by mouth daily., Disp: , Rfl:    Collagen-Vitamin C (COLLAGEN PLUS VITAMIN C PO), Take by mouth., Disp: , Rfl:    dapagliflozin propanediol (FARXIGA) 5 MG TABS tablet, Take 5 mg by mouth daily., Disp: ,  Rfl:    hydrochlorothiazide (HYDRODIURIL) 25 MG tablet, Take 25 mg by mouth daily., Disp: , Rfl:    ibuprofen (ADVIL) 800 MG tablet, Take 1 tablet (800 mg total) by mouth every 8 (eight) hours as needed for moderate pain. For AFTER surgery only, Disp: 30 tablet, Rfl: 0   loratadine (CLARITIN) 10 MG tablet, Take 10 mg by mouth daily as needed for allergies., Disp: , Rfl:    LUTEIN PO, Take 20 mg by mouth daily., Disp: , Rfl:    metFORMIN (GLUCOPHAGE) 1000 MG tablet, Take 1,000 mg by mouth 2 (two) times daily., Disp: , Rfl:    Multiple Vitamins-Minerals (CENTRUM ADULTS PO), Take 1 tablet by mouth daily., Disp: , Rfl:    Omega 3-6-9 Fatty Acids (OMEGA 3-6-9 COMPLEX PO), Take 840 mg by mouth daily., Disp: , Rfl:    Probiotic Product (PROBIOTIC DAILY PO), Take 1 capsule by mouth daily., Disp: , Rfl:    senna-docusate (SENOKOT-S) 8.6-50 MG tablet, Take 2 tablets by mouth at bedtime. For AFTER surgery, do not take if having diarrhea, Disp: 30 tablet, Rfl: 0   TIADYLT ER 240 MG 24 hr capsule, Take 240 mg by mouth daily., Disp: , Rfl:     traMADol (ULTRAM) 50 MG tablet, Take 1 tablet (50 mg total) by mouth every 6 (six) hours as needed for severe pain. For AFTER surgery only, do not take and drive, Disp: 10 tablet, Rfl: 0   triamcinolone ointment (KENALOG) 0.1 %, Apply 1 Application topically 2 (two) times daily as needed (vaginal irritation)., Disp: , Rfl:    Vaginal Lubricant (REVAREE) SUPP, Place 1 suppository vaginally 2 (two) times a week., Disp: , Rfl:   Review of Systems: Denies appetite changes, fevers, chills, fatigue, unexplained weight changes. Denies hearing loss, neck lumps or masses, mouth sores, ringing in ears or voice changes. Denies cough or wheezing.  Denies shortness of breath. Denies chest pain or palpitations. Denies leg swelling. Denies abdominal distention, pain, blood in stools, constipation, diarrhea, nausea, vomiting, or early satiety. Denies pain with intercourse, dysuria, frequency, hematuria or incontinence. Denies hot flashes, pelvic pain, vaginal bleeding or vaginal discharge.   Denies joint pain, back pain or muscle pain/cramps. Denies itching, rash, or wounds. Denies dizziness, headaches, numbness or seizures. Denies swollen lymph nodes or glands, denies easy bruising or bleeding. Denies anxiety, depression, confusion, or decreased concentration.  Physical Exam: BP (!) 140/86 (BP Location: Right Arm, Patient Position: Sitting)   Pulse 72   Temp 98.5 F (36.9 C) (Oral)   Ht 5' 5.75" (1.67 m)   Wt 143 lb (64.9 kg)   SpO2 100%   BMI 23.26 kg/m  General: Alert, oriented, no acute distress. HEENT: Normocephalic, atraumatic, sclera anicteric. Chest: Unlabored breathing on room air. Abdomen: soft, nontender.  Normoactive bowel sounds.  No masses or hepatosplenomegaly appreciated.  Well-healed incisions.  Laboratory & Radiologic Studies: A.  RIGHT AND LEFT FALLOPIAN TUBE AND OVARY, BILATERAL  SALPINGO-OOPHORECTOMY:  Benign fallopian tubes and ovaries  Negative for malignancy   FINAL  MICROSCOPIC DIAGNOSIS:  - No malignant cells identified  - Benign mesothelial cells and mesothelium   Assessment & Plan: Kari Torres is a 57 y.o. woman with Er/PR+ breast cancer now on aromatase inhibitor status post risk-reducing BSO in the setting of PALB2 mutation.  The patient is overall doing very well and meeting postoperative milestones.  Discussed continued expectations and restrictions.  Patient was given a copy of her pathology report from surgery.  She is quite bothered  by vaginal dryness.  We discussed that while vaginal estrogen appears to minimally raise circulating estrogen levels if at all, I typically reach out to a patient's medical oncologist before starting this medication.  I was able to speak with her medical oncologist shortly after her visit today.  She is in agreement with the patient being able to start vaginal estrogen. Prescription sent to her pharmacy and my chart message sent to let patient know of my discussion with Dr. Al Pimple.  18 minutes of total time was spent for this patient encounter, including preparation, face-to-face counseling with the patient and coordination of care, and documentation of the encounter.  Eugene Garnet, MD  Division of Gynecologic Oncology  Department of Obstetrics and Gynecology  [facility]

## 2022-08-01 NOTE — Patient Instructions (Signed)
It was great to see you today.  You are healing very well from surgery.  You are cleared to return to physical activity although no heavy lifting until 6 weeks after surgery.  Please let me know if you need anything in the future.

## 2022-08-14 ENCOUNTER — Ambulatory Visit
Admission: RE | Admit: 2022-08-14 | Discharge: 2022-08-14 | Disposition: A | Discharge status: Home / Self Care | Payer: Managed Care, Other (non HMO) | Source: Ambulatory Visit | Attending: Hematology and Oncology

## 2022-08-14 DIAGNOSIS — Z170 Estrogen receptor positive status [ER+]: Secondary | ICD-10-CM

## 2022-09-16 ENCOUNTER — Ambulatory Visit: Payer: Managed Care, Other (non HMO) | Attending: General Surgery

## 2022-09-16 VITALS — Wt 139.4 lb

## 2022-09-16 DIAGNOSIS — Z483 Aftercare following surgery for neoplasm: Secondary | ICD-10-CM | POA: Insufficient documentation

## 2022-09-16 NOTE — Therapy (Signed)
OUTPATIENT PHYSICAL THERAPY SOZO SCREENING NOTE   Patient Name: Kari Torres MRN: [id number] DOB:1965-11-27, 57 y.o., female Today's Date: 09/16/2022  PCP: Clovis Riley, L.August Saucer, MD REFERRING PROVIDER: Almond Lint, MD   PT End of Session - 09/16/22 1618     Visit Number 7   # unchanged due to screen only   PT Start Time 1616    PT Stop Time 1620    PT Time Calculation (min) 4 min    Activity Tolerance Patient tolerated treatment well    Behavior During Therapy [facility] for tasks assessed/performed             Past Medical History:  Diagnosis Date   Allergic rhinitis due to pollen    Bloody discharge from left nipple 01/29/2017   Breast cancer (HCC)    Diabetes mellitus without complication (HCC)    Family history of breast cancer 11/21/2021   Heart murmur    Hypercholesterolemia    Hypertension    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   Past Surgical History:  Procedure Laterality Date   BREAST LUMPECTOMY Left 01/29/2017   Procedure: LEFT BREAST LUMPECTOMY SUBAREOLAR DISSECTION ERAS PATHWAY;  Surgeon: Claud Kelp, MD;  Location: [facility];  Service: General;  Laterality: Left;  LMA   BREAST LUMPECTOMY WITH RADIOACTIVE SEED AND SENTINEL LYMPH NODE BIOPSY Left 12/06/2021   Procedure: LEFT BREAST LUMPECTOMY WITH RADIOACTIVE SEED AND SENTINEL LYMPH NODE BIOPSY;  Surgeon: Almond Lint, MD;  Location: [facility];  Service: General;  Laterality: Left;   BREAST LUMPECTOMY WITH RADIOACTIVE SEED LOCALIZATION Left 09/15/2015   Procedure: LEFT BREAST LUMPECTOMY WITH RADIOACTIVE SEED LOCALIZATION;  Surgeon: Claud Kelp, MD;  [facility];  Service: General;  Laterality: Left;   CESAREAN SECTION     ROBOTIC ASSISTED SALPINGO OOPHERECTOMY Bilateral 07/03/2022   Procedure: XI ROBOTIC ASSISTED SALPINGO OOPHORECTOMY;  Surgeon: Carver Fila, MD;  Location: [facility];  Service: Gynecology;  Laterality: Bilateral;   WISDOM  TOOTH EXTRACTION     Patient Active Problem List   Diagnosis Date Noted   ER+ (estrogen receptor positive status) 03/08/2022   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HCC) 03/08/2022   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   Genetic testing 11/30/2021   Family history of breast cancer 11/21/2021   Family history of brain cancer 11/21/2021   Family history of bladder cancer 11/21/2021   Malignant neoplasm of upper-outer quadrant of left breast in female, estrogen receptor positive (HCC) 11/19/2021   Bloody discharge from left nipple 01/29/2017   Uncontrolled 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 It measures 1.6 cm and is located in the upper outer quadrant. It is ER/PR positive and HER2 negative with a Ki67 of 5%. She is s/p left lumpectomy with SLNB on 12/06/2021 with 0/3 LN. She  does not require chemo, but will have radiation.She also has diabetes and hypertension I ddi well with my ROM initially, and lost some when the hematoma happened. I have been doing the exercises.   PRECAUTIONS: left UE Lymphedema risk, None  SUBJECTIVE: Pt returns for her 3 month L-Dex screen.   PAIN:  Are you  having pain? No  SOZO SCREENING: Patient was assessed today using the SOZO machine to determine the lymphedema index score. This was compared to her baseline score. It was determined that she is within the recommended range when compared to her baseline and no further action is needed at this time. She will continue SOZO screenings. These are done every 3 months for 2 years post operatively followed by  every 6 months for 2 years, and then annually.   L-DEX FLOWSHEETS - 09/16/22 1600       L-DEX LYMPHEDEMA SCREENING   Measurement Type Unilateral    L-DEX MEASUREMENT EXTREMITY Upper Extremity    DOMINANT SIDE Right    At Risk Side Left    BASELINE SCORE (UNILATERAL) 0.6    L-DEX SCORE (UNILATERAL) 0    VALUE CHANGE (UNILAT) -0.6               Hermenia Bers, PTA 09/16/2022, 4:19 PM

## 2022-09-17 ENCOUNTER — Other Ambulatory Visit: Payer: Self-pay

## 2022-09-17 ENCOUNTER — Encounter: Payer: Self-pay | Admitting: Adult Health

## 2022-09-17 ENCOUNTER — Inpatient Hospital Stay: Payer: Managed Care, Other (non HMO) | Attending: Hematology and Oncology | Admitting: Adult Health

## 2022-09-17 VITALS — BP 138/75 | HR 70 | Temp 98.2°F | Resp 18 | Wt 140.1 lb

## 2022-09-17 DIAGNOSIS — Z833 Family history of diabetes mellitus: Secondary | ICD-10-CM | POA: Diagnosis not present

## 2022-09-17 DIAGNOSIS — Z79899 Other long term (current) drug therapy: Secondary | ICD-10-CM | POA: Insufficient documentation

## 2022-09-17 DIAGNOSIS — Z888 Allergy status to other drugs, medicaments and biological substances status: Secondary | ICD-10-CM | POA: Diagnosis not present

## 2022-09-17 DIAGNOSIS — Z1589 Genetic susceptibility to other disease: Secondary | ICD-10-CM

## 2022-09-17 DIAGNOSIS — Z8249 Family history of ischemic heart disease and other diseases of the circulatory system: Secondary | ICD-10-CM | POA: Insufficient documentation

## 2022-09-17 DIAGNOSIS — Z1509 Genetic susceptibility to other malignant neoplasm: Secondary | ICD-10-CM | POA: Diagnosis not present

## 2022-09-17 DIAGNOSIS — Z148 Genetic carrier of other disease: Secondary | ICD-10-CM | POA: Insufficient documentation

## 2022-09-17 DIAGNOSIS — R232 Flushing: Secondary | ICD-10-CM | POA: Insufficient documentation

## 2022-09-17 DIAGNOSIS — Z808 Family history of malignant neoplasm of other organs or systems: Secondary | ICD-10-CM | POA: Diagnosis not present

## 2022-09-17 DIAGNOSIS — M2550 Pain in unspecified joint: Secondary | ICD-10-CM | POA: Insufficient documentation

## 2022-09-17 DIAGNOSIS — E119 Type 2 diabetes mellitus without complications: Secondary | ICD-10-CM | POA: Insufficient documentation

## 2022-09-17 DIAGNOSIS — Z90722 Acquired absence of ovaries, bilateral: Secondary | ICD-10-CM | POA: Insufficient documentation

## 2022-09-17 DIAGNOSIS — Z79811 Long term (current) use of aromatase inhibitors: Secondary | ICD-10-CM | POA: Insufficient documentation

## 2022-09-17 DIAGNOSIS — C50412 Malignant neoplasm of upper-outer quadrant of left female breast: Secondary | ICD-10-CM

## 2022-09-17 DIAGNOSIS — Z8052 Family history of malignant neoplasm of bladder: Secondary | ICD-10-CM | POA: Diagnosis not present

## 2022-09-17 DIAGNOSIS — Z803 Family history of malignant neoplasm of breast: Secondary | ICD-10-CM | POA: Insufficient documentation

## 2022-09-17 DIAGNOSIS — Z170 Estrogen receptor positive status [ER+]: Secondary | ICD-10-CM | POA: Diagnosis not present

## 2022-09-17 DIAGNOSIS — Z1501 Genetic susceptibility to malignant neoplasm of breast: Secondary | ICD-10-CM

## 2022-09-17 NOTE — Progress Notes (Signed)
SURVIVORSHIP VISIT:  BRIEF ONCOLOGIC HISTORY:  Oncology History  Malignant neoplasm of upper-outer quadrant of left breast in female, estrogen receptor positive (HCC)  11/06/2021 Mammogram   Bilateral screening mammogram shows left breast asymmetry which is indeterminate.  Additional views with possible ultrasound are recommended.  Left breast unilateral diagnostic mammogram confirmed a 1.6 x 1.1 cm upper outer aspect middle depth asymmetry with no other significant masses or calcifications.   11/12/2021 Pathology Results   Left breast needle core biopsy at 2:00 shows invasive ductal carcinoma, grade 2 prognostics ER 100% positive strong staining PR 50% positive moderate to strong staining Ki-67 of 5% and group 4 HER2 negative   11/19/2021 Initial Diagnosis   Malignant neoplasm of upper-outer quadrant of left breast in female, estrogen receptor positive (HCC)   11/20/2021 Cancer Staging   Staging form: Breast, AJCC 8th Edition - Clinical stage from 11/20/2021: Stage IA (cT1c, cN0, cM0, G2, ER+, PR+, HER2-) - Signed by Ronny Bacon, PA-C on 11/20/2021 Method of lymph node assessment: Clinical Histologic grading system: 3 grade system   11/30/2021 Genetic Testing   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  [facility] BRCAPlus report date is 11/30/2021.  [facility] CancerNext-Expanded +RNAinsight report date is 12/06/2021.   The CancerNext-Expanded gene panel offered by [facility] and includes sequencing, rearrangement, and RNA analysis for the following 77 genes: AIP, ALK, APC, ATM, AXIN2, BAP1, BARD1, BLM, BMPR1A, BRCA1, BRCA2, BRIP1, CDC73, CDH1, CDK4, CDKN1B, CDKN2A, CHEK2, CTNNA1, DICER1, FANCC, FH, FLCN, GALNT12, KIF1B, LZTR1, MAX, MEN1, MET, MLH1, MSH2, MSH3, MSH6, MUTYH, NBN, NF1, NF2, NTHL1, PALB2, PHOX2B, PMS2, POT1, PRKAR1A, PTCH1, PTEN, RAD51C, RAD51D, RB1, RECQL, RET, SDHA, SDHAF2, SDHB, SDHC, SDHD, SMAD4, SMARCA4, SMARCB1, SMARCE1, STK11, SUFU, TMEM127, TP53, TSC1,  TSC2, VHL and XRCC2 (sequencing and deletion/duplication); EGFR, EGLN1, HOXB13, KIT, MITF, PDGFRA, POLD1, and POLE (sequencing only); EPCAM and GREM1 (deletion/duplication only).    02/05/2022 - 03/06/2022 Radiation Therapy   Site Technique Total Dose (Gy) Dose per Fx (Gy) Completed Fx Beam Energies  Breast, Left: Breast_L 3D 42.56/42.56 2.66 16/16 6XFFF  Breast, Left: Breast_L_Bst 3D 8/8 2 4/4 6X, 10X     03/2022 -  Anti-estrogen oral therapy   Anastrozole     INTERVAL HISTORY:  [name] to review her survivorship care plan detailing her treatment course for breast cancer, as well as monitoring long-term side effects of that treatment, education regarding health maintenance, screening, and overall wellness and health promotion.     Overall, Kari Torres reports feeling quite well.  She is taking anastrozole daily and has occasional hot flashes and arthralgias that are not particularly bothersome or significant.  REVIEW OF SYSTEMS:  Review of Systems  Constitutional:  Negative for appetite change, chills, fatigue, fever and unexpected weight change.  HENT:   Negative for hearing loss, lump/mass and trouble swallowing.   Eyes:  Negative for eye problems and icterus.  Respiratory:  Negative for chest tightness, cough and shortness of breath.   Cardiovascular:  Negative for chest pain, leg swelling and palpitations.  Gastrointestinal:  Negative for abdominal distention, abdominal pain, constipation, diarrhea, nausea and vomiting.  Endocrine: Negative for hot flashes.  Genitourinary:  Negative for difficulty urinating.   Musculoskeletal:  Positive for arthralgias.  Skin:  Negative for itching and rash.  Neurological:  Negative for dizziness, extremity weakness, headaches and numbness.  Hematological:  Negative for adenopathy. Does not bruise/bleed easily.  Psychiatric/Behavioral:  Negative for depression. The patient is not nervous/anxious.    Breast: Denies any  new nodularity, masses,  tenderness, nipple changes, or nipple discharge.       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   Past Surgical History:  Procedure Laterality Date   BREAST LUMPECTOMY Left 01/29/2017   Procedure: LEFT BREAST LUMPECTOMY SUBAREOLAR DISSECTION ERAS PATHWAY;  Surgeon: Claud Kelp, MD;  Location: [facility];  Service: General;  Laterality: Left;  LMA   BREAST LUMPECTOMY WITH RADIOACTIVE SEED AND SENTINEL LYMPH NODE BIOPSY Left 12/06/2021   Procedure: LEFT BREAST LUMPECTOMY WITH RADIOACTIVE SEED AND SENTINEL LYMPH NODE BIOPSY;  Surgeon: Almond Lint, MD;  Location: [facility];  Service: General;  Laterality: Left;   BREAST LUMPECTOMY WITH RADIOACTIVE SEED LOCALIZATION Left 09/15/2015   Procedure: LEFT BREAST LUMPECTOMY WITH RADIOACTIVE SEED LOCALIZATION;  Surgeon: Claud Kelp, MD;  Location: [facility];  Service: General;  Laterality: Left;   CESAREAN SECTION     ROBOTIC ASSISTED SALPINGO OOPHERECTOMY Bilateral 07/03/2022   Procedure: XI ROBOTIC ASSISTED SALPINGO OOPHORECTOMY;  Surgeon: Carver Fila, MD;  Location: [facility];  Service: Gynecology;  Laterality: Bilateral;   WISDOM TOOTH EXTRACTION       ALLERGIES:  Allergies  Allergen Reactions   Iodine Hives   Shellfish Allergy Hives     CURRENT MEDICATIONS:  Outpatient Encounter Medications as of 09/17/2022  Medication Sig Note   anastrozole (ARIMIDEX) 1 MG tablet Take 1 tablet (1 mg total) by mouth daily.    aspirin 81 MG tablet Take 81 mg by mouth 2 (two) times a week.    atenolol (TENORMIN) 100 MG tablet Take 100 mg by mouth daily.     atorvastatin (LIPITOR) 20 MG tablet Take 20 mg by mouth daily.    Collagen-Vitamin C (COLLAGEN PLUS VITAMIN C PO) Take by mouth.    conjugated estrogens (PREMARIN) vaginal cream Place 1 Applicatorful vaginally 3 (three) times a week. Throw out applicator. Place small amount of cream on your finger tip and insert into the vagina 3 times a week at night.    dapagliflozin propanediol (FARXIGA) 5 MG TABS tablet Take 5 mg by mouth daily.    hydrochlorothiazide (HYDRODIURIL) 25 MG tablet Take 25 mg by mouth daily.    loratadine (CLARITIN) 10 MG tablet Take 10 mg by mouth daily as needed for allergies.    LUTEIN PO Take 20 mg by mouth daily.    metFORMIN (GLUCOPHAGE) 1000 MG tablet Take 1,000 mg by mouth 2 (two) times daily.    Multiple Vitamins-Minerals (CENTRUM ADULTS PO) Take 1 tablet by mouth daily.    Omega 3-6-9 Fatty Acids (OMEGA 3-6-9 COMPLEX PO) Take 840 mg by mouth daily.    Probiotic Product (PROBIOTIC DAILY PO) Take 1 capsule by mouth daily.    TIADYLT ER 240 MG 24 hr capsule Take 240 mg by mouth daily. 06/17/2022: Diltiazem    triamcinolone ointment (KENALOG) 0.1 % Apply 1 Application topically 2 (two) times daily as needed (vaginal irritation).    Vaginal Lubricant (REVAREE) SUPP Place 1 suppository vaginally 2 (two) times a week.    ibuprofen (ADVIL) 800 MG tablet Take 1 tablet (800 mg total) by mouth every 8 (  eight) hours as needed for moderate pain. For AFTER surgery only 06/17/2022: For after procedure 07/03/22   senna-docusate (SENOKOT-S) 8.6-50 MG tablet Take 2 tablets by mouth at bedtime. For AFTER surgery, do not take if having diarrhea 06/17/2022: For after procedure 07/03/22   traMADol (ULTRAM) 50 MG tablet Take 1 tablet (50 mg total) by mouth every 6 (six) hours as needed for severe pain. For AFTER surgery only, do not take and drive 08/20/4130: For after procedure 07/03/22   No facility-administered encounter medications on file as of 09/17/2022.     ONCOLOGIC FAMILY HISTORY:   Family History  Problem Relation Age of Onset   Hypertension Mother    Breast cancer Mother 54       met d. 29   Hypertension Father    Diabetes Father    CAD Father    Brain cancer Maternal Aunt        dx 67s; d. 11s   Cancer Maternal Uncle        unknown type; ? lung;  dx 60s   Hypertension Maternal Grandmother    Diabetes Maternal Grandmother    Congestive Heart Failure Maternal Grandmother    Diabetes Paternal Grandmother    Diabetes Paternal Grandfather    Breast cancer Other        MGM's sister with breast cancer or other primary   Brain cancer Cousin        mat female cousin; dx 16s; d. 88s   Bladder Cancer Half-Brother        dx 34s; paternal half brother     SOCIAL HISTORY:  Social History   Socioeconomic History   Marital status: Divorced    Spouse name: Not on file   Number of children: Not on file   Years of education: Not on file   Highest education level: Not on file  Occupational History   Occupation: works as an Airline pilot  Tobacco Use   Smoking status: Never   Smokeless tobacco: Never  Building services engineer Use: Never used  Substance and Sexual Activity   Alcohol use: Yes    Comment: occasional   Drug use: No   Sexual activity: Not Currently  Other Topics Concern   Not on file  Social History Narrative   Not on file   Social Determinants of Health   Financial Resource Strain: Low Risk  (11/21/2021)   Overall Financial Resource Strain (CARDIA)    Difficulty of Paying Living Expenses: Not very hard  Food Insecurity: No Food Insecurity (11/21/2021)   Hunger Vital Sign    Worried About Running Out of Food in the Last Year: Never true    Ran Out of Food in the Last Year: Never true  Transportation Needs: No Transportation Needs (11/21/2021)   PRAPARE - Administrator, Civil Service (Medical): No    Lack of Transportation (Non-Medical): No  Physical Activity: Not on file  Stress: Not on file  Social Connections: Not on file   Intimate Partner Violence: Not on file     OBSERVATIONS/OBJECTIVE:  BP 138/75 (BP Location: Right Arm, Patient Position: Sitting)   Pulse 70   Temp 98.2 F (36.8 C) (Oral)   Resp 18   Wt 140 lb 1.6 oz (63.5 kg)   SpO2 100%   BMI 22.79 kg/m  GENERAL: Patient is a well appearing female in no acute distress HEENT:  Sclerae anicteric.  Oropharynx clear and moist. No ulcerations or evidence of oropharyngeal candidiasis. Neck is  supple.  NODES:  No cervical, supraclavicular, or axillary lymphadenopathy palpated.  BREAST EXAM: Left breast status postlumpectomy and radiation no sign of local recurrence right breast is benign. LUNGS:  Clear to auscultation bilaterally.  No wheezes or rhonchi. HEART:  Regular rate and rhythm. No murmur appreciated. ABDOMEN:  Soft, nontender.  Positive, normoactive bowel sounds. No organomegaly palpated. MSK:  No focal spinal tenderness to palpation. Full range of motion bilaterally in the upper extremities. EXTREMITIES:  No peripheral edema.   SKIN:  Clear with no obvious rashes or skin changes. No nail dyscrasia. NEURO:  Nonfocal. Well oriented.  Appropriate affect.   LABORATORY DATA:  None for this visit.  DIAGNOSTIC IMAGING:  None for this visit.      ASSESSMENT AND PLAN:  Ms.. Kari Torres is a pleasant 57 y.o. female with Stage IA left breast invasive ductal carcinoma, ER+/PR+/HER2-, diagnosed in 10/2021, treated with lumpectomy, adjuvant radiation therapy, and anti-estrogen therapy with Anastrozole beginning in 03/2022.  She presents to the Survivorship Clinic for our initial meeting and routine follow-up post-completion of treatment for breast cancer.    1. Stage IA left breast cancer:  Ms. Marmo is continuing to recover from definitive treatment for breast cancer. She will follow-up with her medical oncologist, Dr.  Al Pimple in 6 months with history and physical exam per surveillance protocol.  She will continue her anti-estrogen therapy with Anastrozole.  Thus far, she is tolerating the Anastrozole well, with minimal side effects. Her mammogram is due 10/2022; orders placed today.   Today, a comprehensive survivorship care plan and treatment summary was reviewed with the patient today detailing her breast cancer diagnosis, treatment course, potential late/long-term effects of treatment, appropriate follow-up care with recommendations for the future, and patient education resources.  A copy of this summary, along with a letter will be sent to the patient's primary care provider via mail/fax/In Basket message after today's visit.    2. 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  She does not have a family history of pancreatic cancer therefore MRCP is not recommended.  We discussed mammogram alternating with breast MRI.  She will undergo bilateral breast diagnostic mammogram in August 2024.  Orders have been placed for bilateral breast MRI for February 2025.  3. Bone health:  Given Ms. Suares's age/history of breast cancer and her current treatment regimen including anti-estrogen therapy with anastrozole, she is at risk for bone demineralization.  Her most recent bone density occurred in May 2024 and was normal.  She was recommended to repeat bone density testing in May 2026. She was given education on specific activities to promote bone health.  4. Cancer screening:  Due to Ms. Savino's history and her age, she should receive screening for skin cancers, colon cancer, and gynecologic cancers.  The information and recommendations are listed on the patient's comprehensive care plan/treatment summary and were reviewed in detail with the patient.    5. Health maintenance and wellness promotion: Ms. Leopard was encouraged to consume 5-7 servings of fruits and vegetables per day. We reviewed the "Nutrition Rainbow" handout.  She was also encouraged to engage in moderate to vigorous exercise for 30 minutes per day most days of the week.  She was instructed to limit her  alcohol consumption and continue to abstain from tobacco use.     6. Support services/counseling: It is not uncommon for this period of the patient's cancer care trajectory to be one of many emotions and stressors.   She was given information regarding our  available services and encouraged to contact me with any questions or for help enrolling in any of our support group/programs.    Follow up instructions:    -Return to cancer center in 6 months for f/u with Dr. Al Pimple  -Mammogram due in 10/2022 -Breast MRI 04/2023 -Bone density in 07/2024 -She is welcome to return back to the Survivorship Clinic at any time; no additional follow-up needed at this time.  -Consider referral back to survivorship as a long-term survivor for continued surveillance  The patient was provided an opportunity to ask questions and all were answered. The patient agreed with the plan and demonstrated an understanding of the instructions.   Total encounter time:50 minutes*in face-to-face visit time, chart review, lab review, care coordination, order entry, and documentation of the encounter time.    Lillard Anes, NP 09/17/22 10:02 AM Medical Oncology and Hematology [facility] [address] [place], [place] [address] Tel. [phone]    Fax. [phone]  *Total Encounter Time as defined by the Centers for Medicare and Medicaid Services includes, in addition to the face-to-face time of a patient visit (documented in the note above) non-face-to-face time: obtaining and reviewing outside history, ordering and reviewing medications, tests or procedures, care coordination (communications with other health care professionals or caregivers) and documentation in the medical record.

## 2022-09-20 ENCOUNTER — Telehealth: Payer: Self-pay | Admitting: Adult Health

## 2022-09-20 NOTE — Telephone Encounter (Signed)
Scheduled appointment per 6/25 los. Left voicemail.

## 2022-11-18 ENCOUNTER — Ambulatory Visit: Payer: Self-pay

## 2022-12-05 ENCOUNTER — Encounter: Payer: Self-pay | Admitting: Hematology and Oncology

## 2022-12-16 ENCOUNTER — Ambulatory Visit: Payer: Managed Care, Other (non HMO) | Attending: General Surgery

## 2022-12-16 VITALS — Wt 138.5 lb

## 2022-12-16 DIAGNOSIS — Z483 Aftercare following surgery for neoplasm: Secondary | ICD-10-CM | POA: Insufficient documentation

## 2022-12-16 NOTE — Therapy (Signed)
OUTPATIENT PHYSICAL THERAPY SOZO SCREENING NOTE   Patient Name: Kari Torres MRN: [id number] DOB:1965-10-02, 57 y.o., female Today's Date: 12/16/2022  PCP: Clovis Riley, L.August Saucer, MD REFERRING PROVIDER: Almond Lint, MD   PT End of Session - 12/16/22 1619     Visit Number 7   # unchanged due to screen only   PT Start Time 1617    PT Stop Time 1621    PT Time Calculation (min) 4 min    Activity Tolerance Patient tolerated treatment well    Behavior During Therapy [facility] for tasks assessed/performed             Past Medical History:  Diagnosis Date   Allergic rhinitis due to pollen    Bloody discharge from left nipple 01/29/2017   Breast cancer (HCC)    Diabetes mellitus without complication (HCC)    Family history of breast cancer 11/21/2021   Heart murmur    Hypercholesterolemia    Hypertension    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   Past Surgical History:  Procedure Laterality Date   BREAST LUMPECTOMY Left 01/29/2017   Procedure: LEFT BREAST LUMPECTOMY SUBAREOLAR DISSECTION ERAS PATHWAY;  Surgeon: Claud Kelp, MD;  Location: [facility];  Service: General;  Laterality: Left;  LMA   BREAST LUMPECTOMY WITH RADIOACTIVE SEED AND SENTINEL LYMPH NODE BIOPSY Left 12/06/2021   Procedure: LEFT BREAST LUMPECTOMY WITH RADIOACTIVE SEED AND SENTINEL LYMPH NODE BIOPSY;  Surgeon: Almond Lint, MD;  Location: [facility];  Service: General;  Laterality: Left;   BREAST LUMPECTOMY WITH RADIOACTIVE SEED LOCALIZATION Left 09/15/2015   Procedure: LEFT BREAST LUMPECTOMY WITH RADIOACTIVE SEED LOCALIZATION;  Surgeon: Claud Kelp, MD;  Location: [facility];  Service: General;  Laterality: Left;   CESAREAN SECTION     ROBOTIC ASSISTED SALPINGO OOPHERECTOMY Bilateral 07/03/2022   Procedure: XI ROBOTIC ASSISTED SALPINGO OOPHORECTOMY;  Surgeon: Carver Fila, MD;  Location: [facility];  Service: Gynecology;  Laterality: Bilateral;   WISDOM  TOOTH EXTRACTION     Patient Active Problem List   Diagnosis Date Noted   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   Genetic testing 11/30/2021   Family history of breast cancer 11/21/2021   Family history of brain cancer 11/21/2021   Family history of bladder cancer 11/21/2021   Malignant neoplasm of upper-outer quadrant of left breast in female, estrogen receptor positive (HCC) 11/19/2021   Hypertensive disorder 07/12/2021   Diabetes mellitus (HCC) 07/12/2021   Bloody discharge from left nipple 01/29/2017   Uncontrolled 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 It measures 1.6 cm and is located in the upper outer quadrant. It is ER/PR positive and HER2 negative with a Ki67 of 5%. She is s/p left lumpectomy with SLNB on 12/06/2021 with 0/3 LN. She  does not require chemo, but will have radiation.She also has diabetes and hypertension I ddi well with my ROM initially, and lost some when the hematoma happened. I have been doing the exercises.   PRECAUTIONS: left UE Lymphedema risk, None  SUBJECTIVE: Pt returns for her 3 month L-Dex screen.   PAIN:  Are you having pain? No  SOZO  SCREENING: Patient was assessed today using the SOZO machine to determine the lymphedema index score. This was compared to her baseline score. It was determined that she is within the recommended range when compared to her baseline and no further action is needed at this time. She will continue SOZO screenings. These are done every 3 months for 2 years post operatively followed by every 6 months for 2 years, and then  annually.   L-DEX FLOWSHEETS - 12/16/22 1600       L-DEX LYMPHEDEMA SCREENING   Measurement Type Unilateral    L-DEX MEASUREMENT EXTREMITY Upper Extremity    POSITION  Standing    DOMINANT SIDE Right    At Risk Side Left    BASELINE SCORE (UNILATERAL) 0.6    L-DEX SCORE (UNILATERAL) -1.4    VALUE CHANGE (UNILAT) -2               Hermenia Bers, PTA 12/16/2022, 4:20 PM

## 2023-02-17 ENCOUNTER — Ambulatory Visit (INDEPENDENT_AMBULATORY_CARE_PROVIDER_SITE_OTHER): Payer: Managed Care, Other (non HMO) | Admitting: Allergy

## 2023-02-17 ENCOUNTER — Encounter: Payer: Self-pay | Admitting: Allergy

## 2023-02-17 ENCOUNTER — Other Ambulatory Visit: Payer: Self-pay

## 2023-02-17 VITALS — BP 122/82 | HR 65 | Temp 98.3°F | Resp 12 | Ht 66.14 in | Wt 137.2 lb

## 2023-02-17 DIAGNOSIS — J3089 Other allergic rhinitis: Secondary | ICD-10-CM

## 2023-02-17 DIAGNOSIS — L509 Urticaria, unspecified: Secondary | ICD-10-CM

## 2023-02-17 DIAGNOSIS — T781XXD Other adverse food reactions, not elsewhere classified, subsequent encounter: Secondary | ICD-10-CM | POA: Diagnosis not present

## 2023-02-17 NOTE — Patient Instructions (Addendum)
  If you break out again - take pictures and write down what you had done or eaten that day.  At first onset of rash/hives:  Start zyrtec (cetirizine) 10mg  OR allegra (fexofenadine) 180mg  twice a day. If symptoms are not controlled or causes drowsiness let us know. Start Pepcid (famotidine) 20mg  twice a day.  Avoid the following potential triggers: alcohol, tight clothing, NSAIDs, hot showers and getting overheated. See below for proper skin care.   Food Avoid all seafood. For mild symptoms you can take over the counter antihistamines such as Benadryl 1-2 tablets = 25-50mg  and monitor symptoms closely.  If symptoms worsen or if you have severe symptoms including breathing issues, throat closure, significant swelling, whole body hives, severe diarrhea and vomiting, lightheadedness then seek immediate medical care.  Return for allergy skin testing. Will make additional recommendations based on results. Make sure you don't take any antihistamines for 3 days before the skin testing appointment. Don't put any lotion on the back and arms on the day of testing.  Plan on being here for 30-60 minutes.   Return for Skin testing.

## 2023-02-17 NOTE — Progress Notes (Signed)
New Patient Note  RE: Kari Torres MRN: [id number] DOB: [birth year] Date of Office Visit: 02/17/2023  Consult requested by: Kari Coe, MD Primary care provider: Asencion Torres.Kari Saucer, MD (Inactive)  Chief Complaint: Urticaria (States that she has a shellfish allergy, but broke out in hives and thinks it may be due to salmon. Thinks she needs allergy testing.)  History of Present Illness: I had the pleasure of seeing Kari Torres for initial evaluation at the [facility] on 02/17/2023. She is a 57 y.o. female, who is referred here by Dr. Lewie Torres (PCP) for the evaluation of hives.  Discussed the use of AI scribe software for clinical note transcription with the patient, who gave verbal consent to proceed.  The patient, with a known shellfish allergy, presents with recurrent hives since mid-September. The hives, unlike her typical shellfish-induced welts, are small, round, red, and raised, primarily located on the torso. They are not itchy and resolve within a few hours, leaving no marks. The patient reports no other associated symptoms.  The hives were initially sporadic but became a daily occurrence. The patient sought dermatological consultation on October 28th, at which point the hives had resolved due to recent Benadryl use. However, the hives returned the following day.  Upon keeping a journal as advised by the dermatologist, the patient noticed a correlation between salmon consumption and the hives' severity. Since eliminating salmon from her diet, the patient reports a significant reduction in hives.  The patient has a history of breast cancer, now in remission, and is on anastrozole.  The patient also has type 2 diabetes. She recently added a vitamin D supplement to her regimen and has been consuming whey protein in smoothies.  The patient denies any changes in personal care products, diet, or medications, except for the addition of the vitamin D supplement. She also  denies any recent illnesses.   Frequency of episodes: initially occurred daily. Suspected triggers are unknown but concerned about salmon. Denies any fevers, chills, changes in medications, foods, personal care products or recent infections.  She has tried the following therapies: benadryl prn with some benefit. Systemic steroids: no. Currently on no daily meds.  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 Previous history of rash/hives: hives with shrimp in her 59s. Patient is up to date with the following cancer screening tests: physical exam, mammogram, colonoscopy, pap smears. Breast cancer - diagnosed in 2023 and follows with heme/onc.   Assessment and Plan: Kari Torres is a 57 y.o. female with: Urticaria Other adverse food reactions, not elsewhere classified, subsequent encounter Recurrent hives since mid-September, primarily on the torso, lasting a few hours each episode. Noted to worsen with salmon consumption. No other associated symptoms. No recent changes in diet, medications, or personal care products. If you break out again - take pictures and write down what you had done or eaten that day. At first onset of rash/hives:  Start zyrtec (cetirizine) 10mg  OR allegra (fexofenadine) 180mg  twice a day. If symptoms are not controlled or causes drowsiness let us know. Start Pepcid (famotidine) 20mg  twice a day.  Avoid the following potential triggers: alcohol, tight clothing, NSAIDs, hot showers and getting overheated. See below for proper skin care.  Avoid all seafood. For mild symptoms you can take over the counter antihistamines such as Benadryl 1-2 tablets = 25-50mg  and monitor symptoms closely.  If symptoms worsen or if you have severe symptoms including breathing issues, throat closure, significant swelling, whole body hives, severe diarrhea and vomiting,  lightheadedness then seek immediate medical care. Return for allergy skin testing. Will make additional recommendations  based on results.  Other allergic rhinitis Some symptoms and takes antihistamines prn. No prior testing. Return for allergy skin testing. Will make additional recommendations based on results.  Return for Skin testing.  No orders of the defined types were placed in this encounter.  Lab Orders  No laboratory test(s) ordered today    Other allergy screening: Asthma: no Rhino conjunctivitis: minimal Food allergy: yes Medication allergy: no Hymenoptera allergy: no Eczema:no History of recurrent infections suggestive of immunodeficency: no  Diagnostics: None.   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   Genetic testing 11/30/2021   Family history of breast cancer 11/21/2021   Family history of brain cancer 11/21/2021   Family history of bladder cancer 11/21/2021   Malignant neoplasm of upper-outer quadrant of left breast in female, estrogen receptor positive (HCC) 11/19/2021   Hypertensive disorder 07/12/2021   Diabetes mellitus (HCC) 07/12/2021   Bloody discharge from left nipple 01/29/2017   Uncontrolled diabetes mellitus type 2 without complications 06/22/2015   Benign essential hypertension 06/22/2015   Hypercholesterolemia 06/22/2015   Allergic rhinitis due to pollen 06/22/2015   Chest pain 06/22/2015   Past Medical History:  Diagnosis Date   Allergic rhinitis due to pollen    Bloody discharge from left nipple 01/29/2017   Breast cancer (HCC)    Diabetes mellitus without complication (HCC)    Family history of breast cancer 11/21/2021   Heart murmur    Hypercholesterolemia    Hypertension    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   Urticaria    Past Surgical History: Past Surgical History:  Procedure Laterality Date   BREAST LUMPECTOMY Left 01/29/2017   Procedure: LEFT BREAST LUMPECTOMY SUBAREOLAR DISSECTION ERAS PATHWAY;  Surgeon: Claud Kelp, MD;  Location: [facility];  Service: General;  Laterality: Left;  LMA   BREAST LUMPECTOMY WITH RADIOACTIVE SEED AND SENTINEL LYMPH NODE BIOPSY Left 12/06/2021   Procedure: LEFT BREAST LUMPECTOMY WITH RADIOACTIVE SEED AND SENTINEL LYMPH NODE BIOPSY;  Surgeon: Almond Lint, MD;  Location: [facility];  Service: General;  Laterality: Left;   BREAST LUMPECTOMY WITH RADIOACTIVE SEED LOCALIZATION Left 09/15/2015   Procedure: LEFT BREAST LUMPECTOMY WITH RADIOACTIVE SEED LOCALIZATION;  Surgeon: Claud Kelp, MD;  Location: [facility];  Service: General;  Laterality: Left;   CESAREAN SECTION     ROBOTIC ASSISTED SALPINGO OOPHERECTOMY Bilateral 07/03/2022   Procedure: XI ROBOTIC ASSISTED SALPINGO OOPHORECTOMY;  Surgeon: Carver Fila, MD;  Location: [facility];  Service: Gynecology;  Laterality: Bilateral;   WISDOM TOOTH EXTRACTION     Medication List:  Current Outpatient Medications  Medication Sig Dispense Refill   anastrozole (ARIMIDEX) 1 MG tablet Take 1 tablet (1 mg total) by mouth daily. 90 tablet 3   Apple Cider Vinegar 500 MG TABS Take 500 mg by mouth daily. 2 gummys, orally once a day.     aspirin 81 MG tablet Take 81 mg by mouth 2 (two) times a week.     atenolol (TENORMIN) 100 MG tablet Take 100 mg by mouth daily.     atorvastatin (LIPITOR) 20 MG tablet Take 20 mg by mouth daily.     Cholecalciferol (VITAMIN D) 50 MCG (2000 UT) CAPS Take 2,000 Units by mouth daily. 2 gummies orally once a day.     Collagen-Vitamin C (COLLAGEN PLUS  VITAMIN C PO) Take by mouth.     conjugated estrogens (PREMARIN) vaginal cream Place 1 Applicatorful vaginally 3 (three) times a week. Throw out applicator. Place small amount of cream on your finger tip and insert into the vagina 3 times a week at night. 42.5 g 1   dapagliflozin propanediol (FARXIGA) 5 MG TABS tablet Take 5 mg by mouth daily.     fluconazole (DIFLUCAN) 150 MG tablet Take 150 mg by mouth once. 1 tablet orally once a week for  84 days     hydrochlorothiazide (HYDRODIURIL) 25 MG tablet Take 25 mg by mouth daily.     loratadine (CLARITIN) 10 MG tablet Take 10 mg by mouth daily as needed for allergies.     LUTEIN PO Take 20 mg by mouth daily.     metFORMIN (GLUCOPHAGE) 1000 MG tablet Take 1,000 mg by mouth 2 (two) times daily.     Multiple Vitamins-Minerals (CENTRUM ADULTS PO) Take 1 tablet by mouth daily.     Omega 3-6-9 Fatty Acids (OMEGA 3-6-9 COMPLEX PO) Take 840 mg by mouth daily.     Probiotic Product (PROBIOTIC DAILY PO) Take 1 capsule by mouth daily.     TIADYLT ER 240 MG 24 hr capsule Take 240 mg by mouth daily.     Vaginal Lubricant (REVAREE) SUPP Place 1 suppository vaginally 2 (two) times a week.     No current facility-administered medications for this visit.   Allergies: Allergies  Allergen Reactions   Iodine Hives   Shellfish Allergy Hives   Social History: Social History   Socioeconomic History   Marital status: Divorced    Spouse name: Not on file   Number of children: Not on file   Years of education: Not on file   Highest education level: Not on file  Occupational History   Occupation: works as an Airline pilot  Tobacco Use   Smoking status: Never    Passive exposure: Never   Smokeless tobacco: Never  Vaping Use   Vaping status: Never Used  Substance and Sexual Activity   Alcohol use: Yes    Comment: occasional   Drug use: No   Sexual activity: Not Currently  Other Topics Concern   Not on file  Social History Narrative   Not on file   Social Determinants of Health   Financial Resource Strain: Low Risk  (11/21/2021)   Overall Financial Resource Strain (CARDIA)    Difficulty of Paying Living Expenses: Not very hard  Food Insecurity: No Food Insecurity (11/21/2021)   Hunger Vital Sign    Worried About Running Out of Food in the Last Year: Never true    Ran Out of Food in the Last Year: Never true  Transportation Needs: No Transportation Needs (11/21/2021)   PRAPARE -  Administrator, Civil Service (Medical): No    Lack of Transportation (Non-Medical): No  Physical Activity: Not on file  Stress: Not on file  Social Connections: Not on file   Lives in a townhome. Smoking: denies Occupation: Industrial/product designer HistorySurveyor, minerals in the house: no Engineer, civil (consulting) in the family room: no Carpet in the bedroom: yes Heating: electric Cooling: central Pet: no  Family History: Family History  Problem Relation Age of Onset   Hypertension Mother    Breast cancer Mother 55       met d. 7   Hypertension Father    Diabetes Father    CAD Father    Brain cancer Maternal Aunt  dx 40s; d. 63s   Cancer Maternal Uncle        unknown type; ? lung;  dx 60s   Hypertension Maternal Grandmother    Diabetes Maternal Grandmother    Congestive Heart Failure Maternal Grandmother    Diabetes Paternal Grandmother    Diabetes Paternal Grandfather    Brain cancer Cousin        mat female cousin; dx 29s; d. 98s   Asthma Half-Brother    Bladder Cancer Half-Brother        dx 38s; paternal half brother   Breast cancer Other        MGM's sister with breast cancer or other primary   Review of Systems  Constitutional:  Negative for appetite change, chills, fever and unexpected weight change.  HENT:  Negative for congestion and rhinorrhea.   Eyes:  Negative for itching.  Respiratory:  Negative for cough, chest tightness, shortness of breath and wheezing.   Cardiovascular:  Negative for chest pain.  Gastrointestinal:  Negative for abdominal pain.  Genitourinary:  Negative for difficulty urinating.  Skin:  Positive for rash.  Neurological:  Negative for headaches.    Objective: BP 122/82 (BP Location: Right Arm, Patient Position: Sitting, Cuff Size: Normal)   Pulse 65   Temp 98.3 F (36.8 C) (Temporal)   Resp 12   Ht 5' 6.14" (1.68 m)   Wt 137 lb 3.2 oz (62.2 kg)   SpO2 99%   BMI 22.05 kg/m  Body mass index is 22.05  kg/m. Physical Exam Vitals and nursing note reviewed.  Constitutional:      Appearance: Normal appearance. She is well-developed.  HENT:     Head: Normocephalic and atraumatic.     Right Ear: Tympanic membrane and external ear normal.     Left Ear: Tympanic membrane and external ear normal.     Nose: Nose normal.     Mouth/Throat:     Mouth: Mucous membranes are moist.     Pharynx: Oropharynx is clear.  Eyes:     Conjunctiva/sclera: Conjunctivae normal.  Cardiovascular:     Rate and Rhythm: Normal rate and regular rhythm.     Heart sounds: Normal heart sounds. No murmur heard.    No friction rub. No gallop.  Pulmonary:     Effort: Pulmonary effort is normal.     Breath sounds: Normal breath sounds. No wheezing, rhonchi or rales.  Musculoskeletal:     Cervical back: Neck supple.  Skin:    General: Skin is warm.     Findings: No rash.  Neurological:     Mental Status: She is alert and oriented to person, place, and time.  Psychiatric:        Behavior: Behavior normal.   The plan was reviewed with the patient/family, and all questions/concerned were addressed.  It was my pleasure to see Chitara today and participate in her care. Please feel free to contact me with any questions or concerns.  Sincerely,  Wyline Mood, DO Allergy & Immunology  [facility] office: [phone] [facility] office: [phone]

## 2023-02-21 ENCOUNTER — Other Ambulatory Visit: Payer: Self-pay | Admitting: Hematology and Oncology

## 2023-02-24 NOTE — Telephone Encounter (Signed)
Per last OV, continue. [facility] Sagar M Kazuto Sevey, RN  

## 2023-03-02 NOTE — Progress Notes (Unsigned)
Skin testing note  RE: Kari Torres MRN: [id number] DOB: 04/11/1965 Date of Office Visit: 03/03/2023  Referring provider: No ref. provider found Primary care provider: Irven Coe, MD  Chief Complaint: No chief complaint on file.  History of Present Illness: I had the pleasure of seeing Kari Torres for a skin testing visit at the [facility] on 03/02/2023. She is a 57 y.o. female, who is being followed for allergic rhinitis, adverse food reaction urticaria. Her previous allergy office visit was on 02/17/2023 with Dr. Selena Batten. Today is a skin testing visit.   Discussed the use of AI scribe software for clinical note transcription with the patient, who gave verbal consent to proceed.  History of Present Illness             Urticaria Other adverse food reactions, not elsewhere classified, subsequent encounter Recurrent hives since mid-September, primarily on the torso, lasting a few hours each episode. Noted to worsen with salmon consumption. No other associated symptoms. No recent changes in diet, medications, or personal care products. If you break out again - take pictures and write down what you had done or eaten that day. At first onset of rash/hives:  Start zyrtec (cetirizine) 10mg  OR allegra (fexofenadine) 180mg  twice a day. If symptoms are not controlled or causes drowsiness let us know. Start Pepcid (famotidine) 20mg  twice a day.  Avoid the following potential triggers: alcohol, tight clothing, NSAIDs, hot showers and getting overheated. See below for proper skin care.  Avoid all seafood. For mild symptoms you can take over the counter antihistamines such as Benadryl 1-2 tablets = 25-50mg  and monitor symptoms closely.  If symptoms worsen or if you have severe symptoms including breathing issues, throat closure, significant swelling, whole body hives, severe diarrhea and vomiting, lightheadedness then seek immediate medical care. Return for allergy skin  testing. Will make additional recommendations based on results.   Other allergic rhinitis Some symptoms and takes antihistamines prn. No prior testing. Return for allergy skin testing. Will make additional recommendations based on results. Assessment and Plan: [name] is a 57 y.o. female with: ***  Assessment and Plan              No follow-ups on file.  No orders of the defined types were placed in this encounter.  Lab Orders  No laboratory test(s) ordered today    Diagnostics: Spirometry:  Tracings reviewed. Her effort: {Blank single:19197::"Good reproducible efforts.","It was hard to get consistent efforts and there is a question as to whether this reflects a maximal maneuver.","Poor effort, data can not be interpreted."} FVC: ***L FEV1: ***L, ***% predicted FEV1/FVC ratio: ***% Interpretation: {Blank single:19197::"Spirometry consistent with mild obstructive disease","Spirometry consistent with moderate obstructive disease","Spirometry consistent with severe obstructive disease","Spirometry consistent with possible restrictive disease","Spirometry consistent with mixed obstructive and restrictive disease","Spirometry uninterpretable due to technique","Spirometry consistent with normal pattern","No overt abnormalities noted given today's efforts"}.  Please see scanned spirometry results for details.  Skin Testing: {Blank single:19197::"Select foods","Environmental allergy panel","Environmental allergy panel and select foods","Food allergy panel","None","Deferred due to recent antihistamines use"}. *** Results discussed with patient/family.   Previous notes and tests were reviewed. The plan was reviewed with the patient/family, and all questions/concerned were addressed.  It was my pleasure to see Kari Torres today and participate in her care. Please feel free to contact me with any questions or concerns.  Sincerely,  Wyline Mood, DO Allergy & Immunology  [facility] [place] office: [phone] [facility] office:  [phone] 

## 2023-03-03 ENCOUNTER — Ambulatory Visit (INDEPENDENT_AMBULATORY_CARE_PROVIDER_SITE_OTHER): Payer: Managed Care, Other (non HMO) | Admitting: Allergy

## 2023-03-03 ENCOUNTER — Encounter: Payer: Self-pay | Admitting: Allergy

## 2023-03-03 VITALS — BP 138/80 | HR 64 | Temp 98.7°F

## 2023-03-03 DIAGNOSIS — T781XXD Other adverse food reactions, not elsewhere classified, subsequent encounter: Secondary | ICD-10-CM

## 2023-03-03 DIAGNOSIS — J3089 Other allergic rhinitis: Secondary | ICD-10-CM | POA: Diagnosis not present

## 2023-03-03 DIAGNOSIS — L509 Urticaria, unspecified: Secondary | ICD-10-CM

## 2023-03-03 MED ORDER — PREDNISONE 20 MG PO TABS: 20.0000 mg | ORAL_TABLET | Freq: Every day | ORAL | 0 refills | Status: DC

## 2023-03-03 MED ORDER — FAMOTIDINE 20 MG PO TABS: 20.0000 mg | ORAL_TABLET | Freq: Two times a day (BID) | ORAL | 2 refills | Status: DC

## 2023-03-03 NOTE — Patient Instructions (Addendum)
Start prednisone 20mg  once a day for 5 days.  If you break out again - take pictures and write down what you had done or eaten that day.  At first onset of rash/hives:  Start zyrtec (cetirizine) 10mg  OR allegra (fexofenadine) 180mg  twice a day. If symptoms are not controlled or causes drowsiness let us know. Start Pepcid (famotidine) 20mg  twice a day.  Avoid the following potential triggers: alcohol, tight clothing, NSAIDs, hot showers and getting overheated. Continue proper skin care.   Food Avoid all seafood. For mild symptoms you can take over the counter antihistamines such as Benadryl 1-2 tablets = 25-50mg  and monitor symptoms closely.  If symptoms worsen or if you have severe symptoms including breathing issues, throat closure, significant swelling, whole body hives, severe diarrhea and vomiting, lightheadedness then seek immediate medical care.  Get bloodwork We are ordering labs, so please allow 1-2 weeks for the results to come back. With the newly implemented Cures Act, the labs might be visible to you at the same time that they become visible to me. However, I will not address the results until all of the results are back, so please be patient.  In the meantime, continue recommendations in your patient instructions, including avoidance measures (if applicable), until you hear from me.  Return in about 4 weeks (around 03/31/2023).   Skin care recommendations  Bath time: Always use lukewarm water. AVOID very hot or cold water. Keep bathing time to 5-10 minutes. Do NOT use bubble bath. Use a mild soap and use just enough to wash the dirty areas. Do NOT scrub skin vigorously.  After bathing, pat dry your skin with a towel. Do NOT rub or scrub the skin.  Moisturizers and prescriptions:  ALWAYS apply moisturizers immediately after bathing (within 3 minutes). This helps to lock-in moisture. Use the moisturizer several times a day over the whole body. Good summer moisturizers  include: Aveeno, CeraVe, Cetaphil. Good winter moisturizers include: Aquaphor, Vaseline, Cerave, Cetaphil, Eucerin, Vanicream. When using moisturizers along with medications, the moisturizer should be applied about one hour after applying the medication to prevent diluting effect of the medication or moisturize around where you applied the medications. When not using medications, the moisturizer can be continued twice daily as maintenance.  Laundry and clothing: Avoid laundry products with added color or perfumes. Use unscented hypo-allergenic laundry products such as Tide free, Cheer free & gentle, and All free and clear.  If the skin still seems dry or sensitive, you can try double-rinsing the clothes. Avoid tight or scratchy clothing such as wool. Do not use fabric softeners or dyer sheets.

## 2023-03-05 LAB — IGE NUT PROF. W/COMPONENT RFLX

## 2023-03-12 LAB — PROTEIN ELECTROPHORESIS, URINE REFLEX
Albumin ELP, Urine: 0 %
Alpha-1-Globulin, U: 0 %
Alpha-2-Globulin, U: 0 %
Beta Globulin, U: 0 %
Gamma Globulin, U: 0 %
Protein, Ur: <4 mg/dL

## 2023-03-12 LAB — COMPREHENSIVE METABOLIC PANEL
ALT: 23 [IU]/L (ref 0–32)
AST: 24 [IU]/L (ref 0–40)
Albumin: 5.1 g/dL — ABNORMAL HIGH (ref 3.8–4.9)
Alkaline Phosphatase: 80 [IU]/L (ref 44–121)
BUN/Creatinine Ratio: 13 (ref 9–23)
BUN: 12 mg/dL (ref 6–24)
Bilirubin Total: 0.5 mg/dL (ref 0.0–1.2)
CO2: 25 mmol/L (ref 20–29)
Calcium: 10.7 mg/dL — ABNORMAL HIGH (ref 8.7–10.2)
Chloride: 95 mmol/L — ABNORMAL LOW (ref 96–106)
Creatinine, Ser: 0.95 mg/dL (ref 0.57–1.00)
Globulin, Total: 3.2 g/dL (ref 1.5–4.5)
Glucose: 178 mg/dL — ABNORMAL HIGH (ref 70–99)
Potassium: 3.6 mmol/L (ref 3.5–5.2)
Sodium: 139 mmol/L (ref 134–144)
Total Protein: 8.3 g/dL (ref 6.0–8.5)
eGFR: 70 mL/min/{1.73_m2} (ref >59)

## 2023-03-12 LAB — PROTEIN ELECTROPHORESIS, SERUM
A/G Ratio: 1.4 (ref 0.7–1.7)
Albumin ELP: 4.8 g/dL — ABNORMAL HIGH (ref 2.9–4.4)
Alpha 1: 0.2 g/dL (ref 0.0–0.4)
Alpha 2: 0.7 g/dL (ref 0.4–1.0)
Beta: 1.1 g/dL (ref 0.7–1.3)
Gamma Globulin: 1.5 g/dL (ref 0.4–1.8)
Globulin, Total: 3.5 g/dL (ref 2.2–3.9)

## 2023-03-12 LAB — ALLERGENS W/TOTAL IGE AREA 2
Alternaria Alternata IgE: <0.1 kU/L
Aspergillus Fumigatus IgE: <0.1 kU/L
Bermuda Grass IgE: 0.2 kU/L — AB
Cat Dander IgE: 7.65 kU/L — AB
Cedar, Mountain IgE: 2.48 kU/L — AB
Cladosporium Herbarum IgE: <0.1 kU/L
Cockroach, German IgE: 11.2 kU/L — AB
Common Silver Birch IgE: <0.1 kU/L
Cottonwood IgE: 0.2 kU/L — AB
D Farinae IgE: 5.08 kU/L — AB
D Pteronyssinus IgE: 3.43 kU/L — AB
Dog Dander IgE: 5.94 kU/L — AB
Elm, American IgE: 0.38 kU/L — AB
Johnson Grass IgE: 1.05 kU/L — AB
Maple/Box Elder IgE: 0.27 kU/L — AB
Mouse Urine IgE: <0.1 kU/L
Oak, White IgE: 0.22 kU/L — AB
Pecan, Hickory IgE: 0.13 kU/L — AB
Penicillium Chrysogen IgE: <0.1 kU/L
Pigweed, Rough IgE: <0.1 kU/L
Ragweed, Short IgE: 5.67 kU/L — AB
Sheep Sorrel IgE Qn: 0.2 kU/L — AB
Timothy Grass IgE: 3.16 kU/L — AB
White Mulberry IgE: <0.1 kU/L

## 2023-03-12 LAB — IGE NUT PROF. W/COMPONENT RFLX
F017-IgE Hazelnut (Filbert): <0.1 kU/L
F018-IgE Brazil Nut: <0.1 kU/L
F020-IgE Almond: <0.1 kU/L
F202-IgE Cashew Nut: <0.1 kU/L
F203-IgE Pistachio Nut: <0.1 kU/L
F256-IgE Walnut: <0.1 kU/L
Macadamia Nut, IgE: 0.18 kU/L — AB
Peanut, IgE: 0.8 kU/L — AB
Pecan Nut IgE: <0.1 kU/L

## 2023-03-12 LAB — ALLERGEN PROFILE, FOOD-FISH
Allergen Mackerel IgE: <0.1 kU/L
Allergen Salmon IgE: <0.1 kU/L
Allergen Trout IgE: <0.1 kU/L
Allergen Walley Pike IgE: <0.1 kU/L
Halibut IgE: <0.1 kU/L
Tuna: <0.1 kU/L

## 2023-03-12 LAB — PEANUT COMPONENTS
F352-IgE Ara h 8: <0.1 kU/L
F422-IgE Ara h 1: <0.1 kU/L
F423-IgE Ara h 2: <0.1 kU/L
F424-IgE Ara h 3: <0.1 kU/L
F427-IgE Ara h 9: <0.1 kU/L
F447-IgE Ara h 6: <0.1 kU/L

## 2023-03-12 LAB — CBC WITH DIFFERENTIAL/PLATELET
Basophils Absolute: 0 10*3/uL (ref 0.0–0.2)
Basos: 0 %
EOS (ABSOLUTE): 0.1 10*3/uL (ref 0.0–0.4)
Eos: 2 %
Hematocrit: 42.8 % (ref 34.0–46.6)
Hemoglobin: 14 g/dL (ref 11.1–15.9)
Immature Grans (Abs): 0 10*3/uL (ref 0.0–0.1)
Immature Granulocytes: 0 %
Lymphocytes Absolute: 1.6 10*3/uL (ref 0.7–3.1)
Lymphs: 31 %
MCH: 30.1 pg (ref 26.6–33.0)
MCHC: 32.7 g/dL (ref 31.5–35.7)
MCV: 92 fL (ref 79–97)
Monocytes Absolute: 0.4 10*3/uL (ref 0.1–0.9)
Monocytes: 7 %
Neutrophils Absolute: 3.1 10*3/uL (ref 1.4–7.0)
Neutrophils: 60 %
Platelets: 347 10*3/uL (ref 150–450)
RBC: 4.65 x10E6/uL (ref 3.77–5.28)
RDW: 12.4 % (ref 11.7–15.4)
WBC: 5.1 10*3/uL (ref 3.4–10.8)

## 2023-03-12 LAB — FOOD ALLERGY PROFILE
Allergen Corn, IgE: 0.22 kU/L — AB
Clam IgE: 7.06 kU/L — AB
Codfish IgE: <0.1 kU/L
Egg White IgE: 0.15 kU/L — AB
Milk IgE: <0.1 kU/L
Scallop IgE: 9.61 kU/L — AB
Sesame Seed IgE: 0.2 kU/L — AB
Shrimp IgE: 24.2 kU/L — AB
Soybean IgE: <0.1 kU/L
Wheat IgE: 0.13 kU/L — AB

## 2023-03-12 LAB — ALPHA-GAL PANEL
Allergen Lamb IgE: <0.1 kU/L
Beef IgE: <0.1 kU/L
IgE (Immunoglobulin E), Serum: 511 [IU]/mL — ABNORMAL HIGH (ref 6–495)
O215-IgE Alpha-Gal: <0.1 kU/L
Pork IgE: <0.1 kU/L

## 2023-03-12 LAB — ALLERGEN COMPONENT COMMENTS

## 2023-03-12 LAB — SEDIMENTATION RATE: Sed Rate: 16 mm/h (ref 0–40)

## 2023-03-12 LAB — CHRONIC URTICARIA: cu index: 6.5 (ref <10)

## 2023-03-12 LAB — ANA W/REFLEX: Anti Nuclear Antibody (ANA): NEGATIVE

## 2023-03-12 LAB — TRYPTASE: Tryptase: 8.5 ug/L (ref 2.2–13.2)

## 2023-03-12 LAB — C-REACTIVE PROTEIN: CRP: 2 mg/L (ref 0–10)

## 2023-03-12 LAB — C3 AND C4
Complement C3, Serum: 142 mg/dL (ref 82–167)
Complement C4, Serum: 33 mg/dL (ref 12–38)

## 2023-03-12 LAB — ALLERGEN CHOCOLATE: Chocolate/Cacao IgE: <0.1 kU/L

## 2023-03-12 LAB — ALLERGEN PROFILE, SHELLFISH
F023-IgE Crab: 13.5 kU/L — AB
F080-IgE Lobster: 15.3 kU/L — AB
F290-IgE Oyster: 6.27 kU/L — AB

## 2023-03-12 LAB — THYROID CASCADE PROFILE: TSH: 3.35 u[IU]/mL (ref 0.450–4.500)

## 2023-03-24 ENCOUNTER — Inpatient Hospital Stay: Payer: Managed Care, Other (non HMO) | Admitting: Hematology and Oncology

## 2023-04-02 ENCOUNTER — Encounter: Payer: Self-pay | Admitting: Allergy

## 2023-04-02 ENCOUNTER — Ambulatory Visit: Payer: No Typology Code available for payment source | Admitting: Allergy

## 2023-04-02 ENCOUNTER — Other Ambulatory Visit: Payer: Self-pay

## 2023-04-02 VITALS — BP 114/62 | HR 68 | Temp 97.9°F | Resp 12 | Ht 66.34 in | Wt 134.6 lb

## 2023-04-02 DIAGNOSIS — J3089 Other allergic rhinitis: Secondary | ICD-10-CM

## 2023-04-02 DIAGNOSIS — T781XXD Other adverse food reactions, not elsewhere classified, subsequent encounter: Secondary | ICD-10-CM | POA: Diagnosis not present

## 2023-04-02 DIAGNOSIS — J302 Other seasonal allergic rhinitis: Secondary | ICD-10-CM

## 2023-04-02 DIAGNOSIS — L509 Urticaria, unspecified: Secondary | ICD-10-CM | POA: Diagnosis not present

## 2023-04-02 MED ORDER — EPINEPHRINE 0.3 MG/0.3ML IJ SOAJ: 0.3000 mg | INTRAMUSCULAR | 1 refills | Status: AC | PRN

## 2023-04-02 NOTE — Progress Notes (Signed)
 Follow Up Note  RE: Kari Torres MRN: [id number] DOB: 31-Aug-1965 Date of Office Visit: 04/02/2023  Referring provider: Leonel Cole, MD Primary care provider: Leonel Cole, MD  Chief Complaint: Urticaria (Says currently that she hasn't had any hives for about 3 weeks.) and Allergic Rhinitis  (Says they're fine right now and states that medications have been helping.)  History of Present Illness: I had the pleasure of seeing Perpetua Elling for a follow up visit at the [facility] on 04/02/2023. She is a 58 y.o. female, who is being followed for urticaria, adverse food reaction and allergic rhinitis. Her previous allergy  office visit was on 03/03/2023 with Dr. Luke. Today is a regular follow up visit.  Discussed the use of AI scribe software for clinical note transcription with the patient, who gave verbal consent to proceed.  She reported a significant improvement in her condition, with no new hives for the past three weeks. She has been adhering to the prescribed regimen of Pepcid , and Zyrtec twice daily.  The patient's recent blood work revealed allergies to shellfish and peanuts, which was surprising to her as she had been consuming peanuts regularly for their high protein content. She had no known reaction to peanuts or peanut  butter in the past that she is aware of. However, she had been taking Claritin regularly, which might have masked any allergic symptoms.  Tolerating egg, wheat, corn, and sesame seed.      Assessment and Plan: Milca is a 58 y.o. female with: Urticaria Past history - Recurrent hives since mid-September, primarily on the torso, lasting a few hours each episode. Noted to worsen with salmon consumption. No other associated symptoms. No recent changes in diet, medications, or personal care products. Interim history - 2024 bloodwork unremarkable except for positive shellfish and peanuts. Asymptomatic for 3 weeks now.  Unclear if peanut  was a trigger or  not.  Avoid the following potential triggers: alcohol, tight clothing, NSAIDs, hot showers and getting overheated. Step down schedule: Decrease Pepcid  (famotidine ) 20mg  to once a day. Continue zyrtec (cetirizine) 10mg  twice a day. If no symptoms for 2 weeks then: Decrease zyrtec 10mg  to once a day. Continue Pepcid  20mg  once a day. If no symptoms for 2 weeks then: Stop Pepcid  20mg . Continue zyrtec 10mg  once a day. If no symptoms for 2 weeks then: Stop zyrtec 10mg .  If you notice any issues then go back to the dose where you had no issues.   Other adverse food reactions, not elsewhere classified, subsequent encounter 2024 bloodwork positive to peanut , clam, shrimp, scallop, crab, lobster, oyster. Borderline to macadamia, egg, wheat, corn and sesame seed. Negative to chocolate and finned fish. Known allergy  to shellfish. Tolerates finned fish. Avoid shellfish and peanuts.  Okay to reintroduce finned fish back into diet.  Bloodwork was borderline to macadamia, egg, wheat, corn and sesame seed. Monitor symptoms after you eat these foods. You don't have to avoid them if you have no issues.  I have prescribed epinephrine  injectable device and demonstrated proper use. For mild symptoms you can take over the counter antihistamines such as Benadryl 1-2 tablets = 25-50mg  and monitor symptoms closely. If symptoms worsen or if you have severe symptoms including breathing issues, throat closure, significant swelling, whole body hives, severe diarrhea and vomiting, lightheadedness then inject epinephrine  and seek immediate medical care afterwards. Emergency action plan given.  Seasonal and perennial allergic rhinitis Interim history - 2024 bloodwork was positive to dust mites, cat, dog, grass, cockroach,  trees, ragweed. See below for environmental control measures. Use over the counter antihistamines such as Zyrtec (cetirizine), Claritin (loratadine), Allegra (fexofenadine), or Xyzal (levocetirizine) daily as  needed. May take twice a day during allergy  flares. May switch antihistamines every few months.  Return in about 4 months (around 07/31/2023).  Meds ordered this encounter  Medications   EPINEPHrine  0.3 mg/0.3 mL IJ SOAJ injection    Sig: Inject 0.3 mg into the muscle as needed for anaphylaxis.    Dispense:  2 each    Refill:  1    May dispense generic/Mylan/Teva brand.   Lab Orders  No laboratory test(s) ordered today    Diagnostics: None.   Medication List:  Current Outpatient Medications  Medication Sig Dispense Refill   anastrozole  (ARIMIDEX ) 1 MG tablet TAKE 1 TABLET BY MOUTH EVERY DAY 90 tablet 3   Apple Cider Vinegar 500 MG TABS Take 500 mg by mouth daily. 2 gummys, orally once a day.     aspirin (ASPIRIN 81) 81 MG chewable tablet Chew 81 mg by mouth as needed.     aspirin 81 MG tablet Take 81 mg by mouth 2 (two) times a week.     atenolol (TENORMIN) 100 MG tablet Take 100 mg by mouth daily.     atorvastatin (LIPITOR) 20 MG tablet Take 20 mg by mouth daily.     cetirizine (ZYRTEC) 10 MG chewable tablet Chew 10 mg by mouth daily.     Cholecalciferol (VITAMIN D) 50 MCG (2000 UT) CAPS Take 2,000 Units by mouth daily. 2 gummies orally once a day.     Collagen-Vitamin C (COLLAGEN PLUS VITAMIN C PO) Take by mouth.     conjugated estrogens  (PREMARIN ) vaginal cream Place 1 Applicatorful vaginally 3 (three) times a week. Throw out applicator. Place small amount of cream on your finger tip and insert into the vagina 3 times a week at night. 42.5 g 1   dapagliflozin propanediol (FARXIGA) 5 MG TABS tablet Take 5 mg by mouth daily.     EPINEPHrine  0.3 mg/0.3 mL IJ SOAJ injection Inject 0.3 mg into the muscle as needed for anaphylaxis. 2 each 1   famotidine  (PEPCID ) 20 MG tablet Take 1 tablet (20 mg total) by mouth 2 (two) times daily. 60 tablet 2   hydrochlorothiazide  (HYDRODIURIL ) 25 MG tablet Take 25 mg by mouth daily.     LUTEIN PO Take 20 mg by mouth daily.     metFORMIN (GLUCOPHAGE)  1000 MG tablet Take 1,000 mg by mouth 2 (two) times daily.     Multiple Vitamins-Minerals (CENTRUM ADULTS PO) Take 1 tablet by mouth daily.     Omega 3-6-9 Fatty Acids (OMEGA 3-6-9 COMPLEX PO) Take 840 mg by mouth daily.     Probiotic Product (PROBIOTIC DAILY PO) Take 1 capsule by mouth daily.     TIADYLT ER 240 MG 24 hr capsule Take 240 mg by mouth daily.     Vaginal Lubricant (REVAREE) SUPP Place 1 suppository vaginally 2 (two) times a week.     No current facility-administered medications for this visit.   Allergies: Allergies  Allergen Reactions   Iodine Hives   Shellfish Allergy  Hives   I reviewed her past medical history, social history, family history, and environmental history and no significant changes have been reported from her previous visit.  Review of Systems  Constitutional:  Negative for appetite change, chills, fever and unexpected weight change.  HENT:  Negative for congestion and rhinorrhea.   Eyes:  Negative for  itching.  Respiratory:  Negative for cough, chest tightness, shortness of breath and wheezing.   Cardiovascular:  Negative for chest pain.  Gastrointestinal:  Negative for abdominal pain.  Genitourinary:  Negative for difficulty urinating.  Skin:  Negative for rash.  Allergic/Immunologic: Positive for environmental allergies and food allergies.  Neurological:  Negative for headaches.    Objective: BP 114/62 (BP Location: Right Arm, Patient Position: Sitting, Cuff Size: Normal)   Pulse 68   Temp 97.9 F (36.6 C) (Temporal)   Resp 12   Ht 5' 6.34 (1.685 m)   Wt 134 lb 9.6 oz (61.1 kg)   SpO2 98%   BMI 21.50 kg/m  Body mass index is 21.5 kg/m. Physical Exam Vitals and nursing note reviewed.  Constitutional:      Appearance: Normal appearance. She is well-developed.  HENT:     Head: Normocephalic and atraumatic.     Right Ear: Tympanic membrane and external ear normal.     Left Ear: Tympanic membrane and external ear normal.     Nose: Nose  normal.     Mouth/Throat:     Mouth: Mucous membranes are moist.     Pharynx: Oropharynx is clear.  Eyes:     Conjunctiva/sclera: Conjunctivae normal.  Cardiovascular:     Rate and Rhythm: Normal rate and regular rhythm.     Heart sounds: Normal heart sounds. No murmur heard.    No friction rub. No gallop.  Pulmonary:     Effort: Pulmonary effort is normal.     Breath sounds: Normal breath sounds. No wheezing, rhonchi or rales.  Musculoskeletal:     Cervical back: Neck supple.  Skin:    General: Skin is warm.     Findings: No rash.  Neurological:     Mental Status: She is alert and oriented to person, place, and time.  Psychiatric:        Behavior: Behavior normal.    Previous notes and tests were reviewed. The plan was reviewed with the patient/family, and all questions/concerned were addressed.  It was my pleasure to see Lakeria today and participate in her care. Please feel free to contact me with any questions or concerns.  Sincerely,  Orlan Cramp, DO Allergy  & Immunology  [facility]  [place] office: [phone] [facility] office: [phone]

## 2023-04-02 NOTE — Patient Instructions (Addendum)
 Hives: Avoid the following potential triggers: alcohol, tight clothing, NSAIDs, hot showers and getting overheated.  Step down schedule: Decrease Pepcid  (famotidine ) 20mg  to once a day. Continue zyrtec (cetirizine) 10mg  twice a day. If no symptoms for 2 weeks then: Decrease zyrtec 10mg  to once a day. Continue Pepcid  20mg  once a day. If no symptoms for 2 weeks then: Stop Pepcid  20mg . Continue zyrtec 10mg  once a day. If no symptoms for 2 weeks then: Stop zyrtec 10mg .  If you notice any issues then go back to the dose where you had no issues.   Food Avoid shellfish and peanuts.  Okay to reintroduce finned fish back into your diet.  Bloodwork was borderline to macadamia, egg, wheat, corn and sesame seed. Monitor symptoms after you eat these foods. You don't have to avoid them if you have no issues.  I have prescribed epinephrine  injectable device and demonstrated proper use. For mild symptoms you can take over the counter antihistamines such as Benadryl 1-2 tablets = 25-50mg  and monitor symptoms closely. If symptoms worsen or if you have severe symptoms including breathing issues, throat closure, significant swelling, whole body hives, severe diarrhea and vomiting, lightheadedness then inject epinephrine  and seek immediate medical care afterwards. Emergency action plan given.  Environmental allergies 2024 bloodwork was positive to dust mites, cat, dog, grass, cockroach, trees, ragweed. See below for environmental control measures. Use over the counter antihistamines such as Zyrtec (cetirizine), Claritin (loratadine), Allegra (fexofenadine), or Xyzal (levocetirizine) daily as needed. May take twice a day during allergy  flares. May switch antihistamines every few months.  Return in about 4 months (around 07/31/2023).   Control of House Dust Mite Allergen Dust mite allergens are a common trigger of allergy  and asthma symptoms. While they can be found throughout the house, these microscopic creatures  thrive in warm, humid environments such as bedding, upholstered furniture and carpeting. Because so much time is spent in the bedroom, it is essential to reduce mite levels there.  Encase pillows, mattresses, and box springs in special allergen-proof fabric covers or airtight, zippered plastic covers.  Bedding should be washed weekly in hot water  (130 F) and dried in a hot dryer. Allergen-proof covers are available for comforters and pillows that can't be regularly washed.  Wash the allergy -proof covers every few months. Minimize clutter in the bedroom. Keep pets out of the bedroom.  Keep humidity less than 50% by using a dehumidifier or air conditioning. You can buy a humidity measuring device called a hygrometer to monitor this.  If possible, replace carpets with hardwood, linoleum, or washable area rugs. If that's not possible, vacuum frequently with a vacuum that has a HEPA filter. Remove all upholstered furniture and non-washable window drapes from the bedroom. Remove all non-washable stuffed toys from the bedroom.  Wash stuffed toys weekly. Pet Allergen Avoidance: Contrary to popular opinion, there are no "hypoallergenic" breeds of dogs or cats. That is because people are not allergic to an animal's hair, but to an allergen found in the animal's saliva, dander (dead skin flakes) or urine. Pet allergy  symptoms typically occur within minutes. For some people, symptoms can build up and become most severe 8 to 12 hours after contact with the animal. People with severe allergies can experience reactions in public places if dander has been transported on the pet owners' clothing. Keeping an animal outdoors is only a partial solution, since homes with pets in the yard still have higher concentrations of animal allergens. Before getting a pet, ask your allergist to determine  if you are allergic to animals. If your pet is already considered part of your family, try to minimize contact and keep the pet out  of the bedroom and other rooms where you spend a great deal of time. As with dust mites, vacuum carpets often or replace carpet with a hardwood floor, tile or linoleum. High-efficiency particulate air (HEPA) cleaners can reduce allergen levels over time. While dander and saliva are the source of cat and dog allergens, urine is the source of allergens from rabbits, hamsters, mice and guinea pigs; so ask a non-allergic family member to clean the animal's cage. If you have a pet allergy , talk to your allergist about the potential for allergy  immunotherapy (allergy  shots). This strategy can often provide long-term relief. Reducing Pollen Exposure Pollen seasons: trees (spring), grass (summer) and ragweed/weeds (fall). Keep windows closed in your home and car to lower pollen exposure.  Install air conditioning in the bedroom and throughout the house if possible.  Avoid going out in dry windy days - especially early morning. Pollen counts are highest between 5 - 10 AM and on dry, hot and windy days.  Save outside activities for late afternoon or after a heavy rain, when pollen levels are lower.  Avoid mowing of grass if you have grass pollen allergy . Be aware that pollen can also be transported indoors on people and pets.  Dry your clothes in an automatic dryer rather than hanging them outside where they might collect pollen.  Rinse hair and eyes before bedtime. Cockroach Allergen Avoidance Cockroaches are often found in the homes of densely populated urban areas, schools or commercial buildings, but these creatures can lurk almost anywhere. This does not mean that you have a dirty house or living area. Block all areas where roaches can enter the home. This includes crevices, wall cracks and windows.  Cockroaches need water  to survive, so fix and seal all leaky faucets and pipes. Have an exterminator go through the house when your family and pets are gone to eliminate any remaining roaches. Keep food  in lidded containers and put pet food dishes away after your pets are done eating. Vacuum and sweep the floor after meals, and take out garbage and recyclables. Use lidded garbage containers in the kitchen. Wash dishes immediately after use and clean under stoves, refrigerators or toasters where crumbs can accumulate. Wipe off the stove and other kitchen surfaces and cupboards regularly.

## 2023-04-07 ENCOUNTER — Ambulatory Visit: Payer: Self-pay | Attending: General Surgery

## 2023-04-07 VITALS — Wt 136.1 lb

## 2023-04-07 DIAGNOSIS — Z483 Aftercare following surgery for neoplasm: Secondary | ICD-10-CM | POA: Insufficient documentation

## 2023-04-07 NOTE — Therapy (Signed)
 OUTPATIENT PHYSICAL THERAPY SOZO SCREENING NOTE   Patient Name: Kari Torres MRN: [id number] DOB:08-Jan-1966, 58 y.o., female Today's Date: 04/07/2023  PCP: Leonel Cole, MD REFERRING PROVIDER: Aron Shoulders, MD   PT End of Session - 04/07/23 1622     Visit Number 7   # unchanged due to screen only   PT Start Time 1620    PT Stop Time 1624    PT Time Calculation (min) 4 min    Activity Tolerance Patient tolerated treatment well    Behavior During Therapy [facility] for tasks assessed/performed             Past Medical History:  Diagnosis Date   Allergic rhinitis due to pollen    Bloody discharge from left nipple 01/29/2017   Breast cancer (HCC)    Diabetes mellitus without complication (HCC)    Family history of breast cancer 11/21/2021   Heart murmur    Hypercholesterolemia    Hypertension    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   Urticaria    Past Surgical History:  Procedure Laterality Date   BREAST LUMPECTOMY Left 01/29/2017   Procedure: LEFT BREAST LUMPECTOMY SUBAREOLAR DISSECTION ERAS PATHWAY;  Surgeon: Gail Favorite, MD;  Location: [facility];  Service: General;  Laterality: Left;  LMA   BREAST LUMPECTOMY WITH RADIOACTIVE SEED AND SENTINEL LYMPH NODE BIOPSY Left 12/06/2021   Procedure: LEFT BREAST LUMPECTOMY WITH RADIOACTIVE SEED AND SENTINEL LYMPH NODE BIOPSY;  Surgeon: Aron Shoulders, MD;  Location: [facility];  Service: General;  Laterality: Left;   BREAST LUMPECTOMY WITH RADIOACTIVE SEED LOCALIZATION Left 09/15/2015   Procedure: LEFT BREAST LUMPECTOMY WITH RADIOACTIVE SEED LOCALIZATION;  Surgeon: Favorite Gail, MD;  Location: [facility];  Service: General;  Laterality: Left;   CESAREAN SECTION     ROBOTIC ASSISTED SALPINGO OOPHERECTOMY Bilateral 07/03/2022   Procedure: XI ROBOTIC ASSISTED SALPINGO OOPHORECTOMY;  Surgeon: Viktoria Comer SAUNDERS, MD;  Location: [facility];  Service: Gynecology;  Laterality: Bilateral;    WISDOM TOOTH EXTRACTION     Patient Active Problem List   Diagnosis Date Noted   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   Genetic testing 11/30/2021   Family history of breast cancer 11/21/2021   Family history of brain cancer 11/21/2021   Family history of bladder cancer 11/21/2021   Malignant neoplasm of upper-outer quadrant of left breast in female, estrogen receptor positive (HCC) 11/19/2021   Hypertensive disorder 07/12/2021   Diabetes mellitus (HCC) 07/12/2021   Bloody discharge from left nipple 01/29/2017   Uncontrolled 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 It measures 1.6 cm and is located in the upper outer quadrant. It is ER/PR positive and HER2 negative with a Ki67 of 5%. She is s/p left lumpectomy with SLNB on 12/06/2021 with 0/3 LN. She  does not require chemo, but will have radiation.She also has diabetes and hypertension I ddi well with my ROM initially, and lost some when the hematoma happened. I have been doing the exercises.   PRECAUTIONS: left UE Lymphedema risk, None  SUBJECTIVE: Pt returns for her 3 month L-Dex screen.   PAIN:  Are you having  pain? No  SOZO SCREENING: Patient was assessed today using the SOZO machine to determine the lymphedema index score. This was compared to her baseline score. It was determined that she is within the recommended range when compared to her baseline and no further action is needed at this time. She will continue SOZO screenings. These are done every 3 months for 2 years post operatively followed by every 6 months for 2 years, and  then annually.   L-DEX FLOWSHEETS - 04/07/23 1600       L-DEX LYMPHEDEMA SCREENING   Measurement Type Unilateral    L-DEX MEASUREMENT EXTREMITY Upper Extremity    POSITION  Standing    DOMINANT SIDE Right    At Risk Side Left    BASELINE SCORE (UNILATERAL) 0.6    L-DEX SCORE (UNILATERAL) -1.3    VALUE CHANGE (UNILAT) -1.9               Aden Berwyn Caldron, PTA 04/07/2023, 4:23 PM

## 2023-04-09 ENCOUNTER — Telehealth: Payer: Self-pay | Admitting: Allergy

## 2023-04-09 NOTE — Telephone Encounter (Signed)
 Kari Torres called in  and states that hives have been suppressed and it was discussed at last visit to wean off medication and introduce new foods.  Kari Torres states she woke up this morning in hives, but did eat salmon last night.  Kari Torres wants to make this an FYI unless there is something Dr. [name] wants to do.  Kari Torres states if Dr. [name] can call her she would appreciate it.

## 2023-04-09 NOTE — Telephone Encounter (Signed)
 Pt takes zyrtec 10mg  bid and pepcid  20mg  bid and she started Sunday weaning off it 1 pepcid  and zyrtec bid.

## 2023-04-09 NOTE — Telephone Encounter (Signed)
 Please call patient back.  Ask her what dose of zyrtec and famotidine  she took yesterday?  Hold off fish reintroduction for now.

## 2023-04-10 MED ORDER — PREDNISONE 10 MG PO TABS: ORAL_TABLET | ORAL | 0 refills | Status: DC

## 2023-04-10 NOTE — Addendum Note (Signed)
Addended by: Ellamae Sia on: 04/10/2023 12:29 PM   Modules accepted: Orders

## 2023-04-10 NOTE — Telephone Encounter (Signed)
Patient calling going over what happened after eating a piece of salmon Tuesday evening, which she had a hive develop on her leg. The next morning she had hives on lower torso and front,back of both legs. She took a zyrtec and pepcid yesterday and 1 benadry last night. This morning she has some hives on her neck. She is going to go home and take a couple of benadryl. She says that helps to resolve her hives. She did take a zyrtec and pepcid this morning and will do the same thing tonight. Do you want her to continue taking 2 benadryl every 4-6 hours until her hives are resolved? Will she need some prednisone called out?

## 2023-04-10 NOTE — Telephone Encounter (Signed)
Please call patient back.  Avoid finned fish.  Continue with current regimen.  Okay to take benadryl every 4-6 hours as needed.  I'll send in a few days of prednisone - monitor sugars as it can increase them.  Start prednisone taper. Prednisone 10mg  tablets - take 2 tablets for 4 days then 1 tablet on day 5.

## 2023-04-10 NOTE — Telephone Encounter (Signed)
Pt stated understanding on taking prednisone for 5 days benadryl as needed, she is going to avoid all finned fish and continue current regiment

## 2023-04-13 ENCOUNTER — Telehealth: Payer: Self-pay | Admitting: Allergy & Immunology

## 2023-04-13 NOTE — Telephone Encounter (Signed)
I received a call from the patient. She reports that she is having a hive outbreak. She sees Dr. Selena Batten and is on Pepcid and Zyrtec BID. She talked to her recently about weaning off of the medications. She has weaned off of the Pepcid tablets. She started weaning herself off of Pepcid one week ago.   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 She has avoided these foods. She had tuna salad on Monday and did fine. Then she had salmon on Tuesday evening and she woke up with hives on Wednesday morning. She has full body hives on her legs and lower torso.  She woke up on Thursday and they were even worse. She got prednisone called in on Thursday. She has been taking two prednisone for four days and then one after that. She was also taking Benadryl. She had them fading over the next couple of days.   She woke up Saturday morning and they were back with a new outbreak. She did not worry much at that point. But they have worsened over time. It was all torso now around her underarms and collar bones and sides.   She has been having new outbreaks now. She reports that they are large and whelty looking hives.   I recommended increasing her Zyrtec to 20mg  BID for now. I did offer some more prednisone, but she does not really want to do that, which I get.   I did discuss Xolair with her and she is going to do some research into that. But she prefers to just try the higher Zyrtec for now.   She also requests that send the note to Dr. Selena Batten, which I will definitely do.   Malachi Bonds, MD [facility]

## 2023-04-15 ENCOUNTER — Encounter: Payer: Self-pay | Admitting: Hematology and Oncology

## 2023-04-15 ENCOUNTER — Ambulatory Visit (INDEPENDENT_AMBULATORY_CARE_PROVIDER_SITE_OTHER): Payer: No Typology Code available for payment source | Admitting: Allergy

## 2023-04-15 ENCOUNTER — Encounter: Payer: Self-pay | Admitting: Allergy

## 2023-04-15 ENCOUNTER — Inpatient Hospital Stay
Payer: No Typology Code available for payment source | Attending: Hematology and Oncology | Admitting: Hematology and Oncology

## 2023-04-15 VITALS — BP 164/94 | HR 68 | Temp 97.7°F | Resp 16

## 2023-04-15 VITALS — BP 163/86 | HR 69 | Temp 98.7°F | Resp 16 | Wt 135.3 lb

## 2023-04-15 DIAGNOSIS — L509 Urticaria, unspecified: Secondary | ICD-10-CM | POA: Insufficient documentation

## 2023-04-15 DIAGNOSIS — Z808 Family history of malignant neoplasm of other organs or systems: Secondary | ICD-10-CM | POA: Insufficient documentation

## 2023-04-15 DIAGNOSIS — Z8052 Family history of malignant neoplasm of bladder: Secondary | ICD-10-CM | POA: Diagnosis not present

## 2023-04-15 DIAGNOSIS — Z803 Family history of malignant neoplasm of breast: Secondary | ICD-10-CM | POA: Insufficient documentation

## 2023-04-15 DIAGNOSIS — R030 Elevated blood-pressure reading, without diagnosis of hypertension: Secondary | ICD-10-CM | POA: Diagnosis not present

## 2023-04-15 DIAGNOSIS — Z79811 Long term (current) use of aromatase inhibitors: Secondary | ICD-10-CM | POA: Diagnosis not present

## 2023-04-15 DIAGNOSIS — Z888 Allergy status to other drugs, medicaments and biological substances status: Secondary | ICD-10-CM | POA: Diagnosis not present

## 2023-04-15 DIAGNOSIS — Z91010 Allergy to peanuts: Secondary | ICD-10-CM | POA: Diagnosis not present

## 2023-04-15 DIAGNOSIS — T781XXD Other adverse food reactions, not elsewhere classified, subsequent encounter: Secondary | ICD-10-CM | POA: Diagnosis not present

## 2023-04-15 DIAGNOSIS — C50412 Malignant neoplasm of upper-outer quadrant of left female breast: Secondary | ICD-10-CM | POA: Diagnosis not present

## 2023-04-15 DIAGNOSIS — L508 Other urticaria: Secondary | ICD-10-CM

## 2023-04-15 DIAGNOSIS — Z170 Estrogen receptor positive status [ER+]: Secondary | ICD-10-CM | POA: Insufficient documentation

## 2023-04-15 DIAGNOSIS — Z91041 Radiographic dye allergy status: Secondary | ICD-10-CM | POA: Insufficient documentation

## 2023-04-15 DIAGNOSIS — Z825 Family history of asthma and other chronic lower respiratory diseases: Secondary | ICD-10-CM | POA: Diagnosis not present

## 2023-04-15 DIAGNOSIS — Z79899 Other long term (current) drug therapy: Secondary | ICD-10-CM | POA: Diagnosis not present

## 2023-04-15 DIAGNOSIS — Z8249 Family history of ischemic heart disease and other diseases of the circulatory system: Secondary | ICD-10-CM | POA: Diagnosis not present

## 2023-04-15 DIAGNOSIS — Z833 Family history of diabetes mellitus: Secondary | ICD-10-CM | POA: Insufficient documentation

## 2023-04-15 DIAGNOSIS — E119 Type 2 diabetes mellitus without complications: Secondary | ICD-10-CM | POA: Diagnosis not present

## 2023-04-15 DIAGNOSIS — Z1721 Progesterone receptor positive status: Secondary | ICD-10-CM | POA: Diagnosis not present

## 2023-04-15 DIAGNOSIS — N6489 Other specified disorders of breast: Secondary | ICD-10-CM | POA: Insufficient documentation

## 2023-04-15 DIAGNOSIS — Z90721 Acquired absence of ovaries, unilateral: Secondary | ICD-10-CM | POA: Insufficient documentation

## 2023-04-15 DIAGNOSIS — J3089 Other allergic rhinitis: Secondary | ICD-10-CM

## 2023-04-15 DIAGNOSIS — Z809 Family history of malignant neoplasm, unspecified: Secondary | ICD-10-CM | POA: Insufficient documentation

## 2023-04-15 DIAGNOSIS — J302 Other seasonal allergic rhinitis: Secondary | ICD-10-CM

## 2023-04-15 MED ORDER — METHYLPREDNISOLONE ACETATE 80 MG/ML IJ SUSP
80.0000 mg | Freq: Once | INTRAMUSCULAR | Status: AC
  Administered 2023-04-15: 80 mg via INTRAMUSCULAR

## 2023-04-15 MED ORDER — METHYLPREDNISOLONE 4 MG PO TBPK: ORAL_TABLET | ORAL | 0 refills | Status: DC

## 2023-04-15 NOTE — Assessment & Plan Note (Signed)
This is a very pleasant 58 year old female patient with newly diagnosed left breast grade 2 invasive ductal carcinoma, ER/PR positive, HER2 negative by FISH referred to breast [facility] for additional recommendations.  We have discussed about proceeding with upfront surgery given the small size of the tumor and node negative as well as strong ER/PR positivity.   Oncotype of 25, no additional benefit from chemotherapy.  She completed adj radiation, now on anastrozole. Last mammogram in August negative for malignancy. Baseline bone density normal.

## 2023-04-15 NOTE — Patient Instructions (Addendum)
Hives: Steroid shot given. Start medrol pak.  Continue zyrtec (cetirizine) 20mg  at night and allegra 2 tablets total of 360mg  in the morning.  If symptoms are not controlled or causes drowsiness let us know. Continue Pepcid (famotidine) 20mg  twice a day.  Avoid the following potential triggers: alcohol, tight clothing, NSAIDs, hot showers and getting overheated. Read about Xolair injections - handout given.  Food Avoid all seafood and peanuts.  Hold off fish oil.  Bloodwork was borderline to macadamia, egg, wheat, corn and sesame seed. Monitor symptoms after you eat these foods. You don't have to avoid them if you have no issues.  For mild symptoms you can take over the counter antihistamines such as Benadryl 1-2 tablets = 25-50mg  and monitor symptoms closely. If symptoms worsen or if you have severe symptoms including breathing issues, throat closure, significant swelling, whole body hives, severe diarrhea and vomiting, lightheadedness then inject epinephrine and seek immediate medical care afterwards. Emergency action plan in place.   Environmental allergies 2024 bloodwork was positive to dust mites, cat, dog, grass, cockroach, trees, ragweed. Continue environmental control measures. Use over the counter antihistamines such as Zyrtec (cetirizine), Claritin (loratadine), Allegra (fexofenadine), or Xyzal (levocetirizine) daily as needed. May take twice a day during allergy flares. May switch antihistamines every few months.  Elevated blood pressure  Blood pressure reading was high in our office today. Vitals:   04/15/23 1136  BP: (!) 162/94   Please follow up with PCP regarding this.    Return in about 2 weeks (around 04/29/2023).

## 2023-04-15 NOTE — Progress Notes (Signed)
BRIEF ONCOLOGIC HISTORY:  Oncology History  Malignant neoplasm of upper-outer quadrant of left breast in female, estrogen receptor positive (HCC)  11/06/2021 Mammogram   Bilateral screening mammogram shows left breast asymmetry which is indeterminate.  Additional views with possible ultrasound are recommended.  Left breast unilateral diagnostic mammogram confirmed a 1.6 x 1.1 cm upper outer aspect middle depth asymmetry with no other significant masses or calcifications.   11/12/2021 Pathology Results   Left breast needle core biopsy at 2:00 shows invasive ductal carcinoma, grade 2 prognostics ER 100% positive strong staining PR 50% positive moderate to strong staining Ki-67 of 5% and group 4 HER2 negative   11/19/2021 Initial Diagnosis   Malignant neoplasm of upper-outer quadrant of left breast in female, estrogen receptor positive (HCC)   11/20/2021 Cancer Staging   Staging form: Breast, AJCC 8th Edition - Clinical stage from 11/20/2021: Stage IA (cT1c, cN0, cM0, G2, ER+, PR+, HER2-) - Signed by Ronny Bacon, PA-C on 11/20/2021 Method of lymph node assessment: Clinical Histologic grading system: 3 grade system   11/30/2021 Genetic Testing   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  [facility] report date is 11/30/2021.  [facility] CancerNext-Expanded +RNAinsight report date is 12/06/2021.   The CancerNext-Expanded gene panel offered by [facility] and includes sequencing, rearrangement, and RNA analysis for the following 77 genes: AIP, ALK, APC, ATM, AXIN2, BAP1, BARD1, BLM, BMPR1A, BRCA1, BRCA2, BRIP1, CDC73, CDH1, CDK4, CDKN1B, CDKN2A, CHEK2, CTNNA1, DICER1, FANCC, FH, FLCN, GALNT12, KIF1B, LZTR1, MAX, MEN1, MET, MLH1, MSH2, MSH3, MSH6, MUTYH, NBN, NF1, NF2, NTHL1, PALB2, PHOX2B, PMS2, POT1, PRKAR1A, PTCH1, PTEN, RAD51C, RAD51D, RB1, RECQL, RET, SDHA, SDHAF2, SDHB, SDHC, SDHD, SMAD4, SMARCA4, SMARCB1, SMARCE1, STK11, SUFU, TMEM127, TP53, TSC1, TSC2, VHL and XRCC2  (sequencing and deletion/duplication); EGFR, EGLN1, HOXB13, KIT, MITF, PDGFRA, POLD1, and POLE (sequencing only); EPCAM and GREM1 (deletion/duplication only).    12/06/2021 Surgery   Left breast lumpectomy: IDC, 1.5cm, g2, margins negative, 3 SLN negative, T1cN0   12/06/2021 Oncotype testing   25/12%   02/05/2022 - 03/06/2022 Radiation Therapy   Site Technique Total Dose (Gy) Dose per Fx (Gy) Completed Fx Beam Energies  Breast, Left: Breast_L 3D 42.56/42.56 2.66 16/16 6XFFF  Breast, Left: Breast_L_Bst 3D 8/8 2 4/4 6X, 10X     03/2022 -  Anti-estrogen oral therapy   Anastrozole   07/03/2022 Surgery   BSO: benign     INTERVAL HISTORY:   Discussed the use of AI scribe software for clinical note transcription with the patient, who gave verbal consent to proceed.  History of Present Illness    The patient, with a history of PALB2 gene mutation and breast cancer, is here for a follow up. Today, her most pressing concern is ongoing hives. The hives first appeared in September and have been resistant to treatment with Pepcid and Zyrtec. The patient has been under the care of an allergist, who conducted blood work and environmental testing. Initial results suggested allergies to shellfish and peanuts, but no allergy to fin fish. However, the patient experienced a breakout of hives after reintroducing fish into their diet. The patient also reports joint pain, which they are unsure if it is related to their anastrozole medication or age. The patient is concerned about the persistent hives and is seeking further investigation into potential causes, including possible food allergies and underlying medical conditions.  REVIEW OF SYSTEMS:  Review of Systems  Constitutional:  Negative for appetite change, chills, fatigue, fever and unexpected weight change.  HENT:  Negative for hearing loss, lump/mass and trouble swallowing.   Eyes:  Negative for eye problems and icterus.  Respiratory:  Negative for  chest tightness, cough and shortness of breath.   Cardiovascular:  Negative for chest pain, leg swelling and palpitations.  Gastrointestinal:  Negative for abdominal distention, abdominal pain, constipation, diarrhea, nausea and vomiting.  Endocrine: Negative for hot flashes.  Genitourinary:  Negative for difficulty urinating.   Musculoskeletal:  Positive for arthralgias.  Skin:  Positive for rash (Hives). Negative for itching.  Neurological:  Negative for dizziness, extremity weakness, headaches and numbness.  Hematological:  Negative for adenopathy. Does not bruise/bleed easily.  Psychiatric/Behavioral:  Negative for depression. The patient is not nervous/anxious.    Breast: Denies any new nodularity, masses, tenderness, nipple changes, or nipple discharge.       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   Urticaria    Past Surgical History:  Procedure Laterality Date   BREAST LUMPECTOMY Left 01/29/2017   Procedure: LEFT BREAST LUMPECTOMY SUBAREOLAR DISSECTION ERAS PATHWAY;  Surgeon: Claud Kelp, MD;  Location: [facility];  Service: General;  Laterality: Left;  LMA   BREAST LUMPECTOMY WITH RADIOACTIVE SEED AND SENTINEL LYMPH NODE BIOPSY Left 12/06/2021   Procedure: LEFT BREAST LUMPECTOMY WITH RADIOACTIVE SEED AND SENTINEL LYMPH NODE BIOPSY;  Surgeon: Almond Lint, MD;  Location: [facility];  Service: General;  Laterality: Left;   BREAST LUMPECTOMY WITH RADIOACTIVE SEED LOCALIZATION Left 09/15/2015   Procedure: LEFT BREAST LUMPECTOMY WITH RADIOACTIVE SEED LOCALIZATION;  Surgeon: Claud Kelp, MD;  Location: [facility];  Service: General;  Laterality: Left;   CESAREAN SECTION     ROBOTIC ASSISTED SALPINGO OOPHERECTOMY Bilateral 07/03/2022   Procedure: XI ROBOTIC ASSISTED SALPINGO OOPHORECTOMY;  Surgeon: Carver Fila, MD;  Location: [facility];  Service: Gynecology;  Laterality: Bilateral;   WISDOM TOOTH EXTRACTION       ALLERGIES:  Allergies  Allergen Reactions   Iodine Hives   Shellfish Allergy Hives     CURRENT MEDICATIONS:  Outpatient Encounter Medications as of 04/15/2023  Medication Sig Note   anastrozole (ARIMIDEX) 1 MG tablet TAKE 1 TABLET BY MOUTH EVERY DAY    Apple Cider Vinegar 500 MG TABS Take 500 mg by mouth daily. 2 gummys, orally once a day.    aspirin (ASPIRIN 81) 81 MG chewable tablet Chew 81 mg by mouth as needed.    aspirin 81 MG tablet Take 81 mg by mouth 2 (two) times a week.    atenolol (TENORMIN) 100 MG tablet Take 100 mg by mouth daily.    atorvastatin (LIPITOR) 20 MG tablet Take 20 mg by mouth daily.    cetirizine (ZYRTEC) 10 MG chewable tablet Chew 10 mg by mouth daily.    Cholecalciferol (VITAMIN D) 50 MCG (2000 UT) CAPS Take 2,000 Units by mouth daily. 2 gummies orally once a day.    Collagen-Vitamin C (COLLAGEN PLUS VITAMIN C PO) Take by mouth.    conjugated estrogens (PREMARIN) vaginal cream Place 1 Applicatorful vaginally 3 (three) times a week. Throw out applicator. Place small amount of cream on your finger tip and insert  into the vagina 3 times a week at night.    dapagliflozin propanediol (FARXIGA) 5 MG TABS tablet Take 5 mg by mouth daily.    EPINEPHrine 0.3 mg/0.3 mL IJ SOAJ injection Inject 0.3 mg into the muscle as needed for anaphylaxis.    famotidine (PEPCID) 20 MG tablet Take 1 tablet (20 mg total) by mouth 2 (two) times daily.    hydrochlorothiazide (HYDRODIURIL) 25 MG tablet Take 25 mg by mouth daily.    LUTEIN PO Take 20 mg by mouth daily.    metFORMIN (GLUCOPHAGE) 1000 MG tablet Take 1,000 mg by mouth 2 (two) times daily.    Multiple  Vitamins-Minerals (CENTRUM ADULTS PO) Take 1 tablet by mouth daily.    Omega 3-6-9 Fatty Acids (OMEGA 3-6-9 COMPLEX PO) Take 840 mg by mouth daily.    predniSONE (DELTASONE) 10 MG tablet Start prednisone taper. Prednisone 10mg  tablets - take 2 tablets for 4 days then 1 tablet on day 5.    Probiotic Product (PROBIOTIC DAILY PO) Take 1 capsule by mouth daily.    TIADYLT ER 240 MG 24 hr capsule Take 240 mg by mouth daily. 06/17/2022: Diltiazem    Vaginal Lubricant (REVAREE) SUPP Place 1 suppository vaginally 2 (two) times a week.    No facility-administered encounter medications on file as of 04/15/2023.     ONCOLOGIC FAMILY HISTORY:  Family History  Problem Relation Age of Onset   Hypertension Mother    Breast cancer Mother 47       met d. 49   Hypertension Father    Diabetes Father    CAD Father    Brain cancer Maternal Aunt        dx 48s; d. 65s   Cancer Maternal Uncle        unknown type; ? lung;  dx 60s   Hypertension Maternal Grandmother    Diabetes Maternal Grandmother    Congestive Heart Failure Maternal Grandmother    Diabetes Paternal Grandmother    Diabetes Paternal Grandfather    Brain cancer Cousin        mat female cousin; dx 25s; d. 35s   Asthma Half-Brother    Bladder Cancer Half-Brother        dx 35s; paternal half brother   Breast cancer Other        MGM's sister with breast cancer or other primary     SOCIAL HISTORY:  Social History   Socioeconomic History   Marital status: Divorced    Spouse name: Not on file   Number of children: Not on file   Years of education: Not on file   Highest education level: Not on file  Occupational History   Occupation: works as an Airline pilot  Tobacco Use   Smoking status: Never    Passive exposure: Never   Smokeless tobacco: Never  Vaping Use   Vaping status: Never Used  Substance and Sexual Activity   Alcohol use: Yes    Comment: occasional   Drug use: No   Sexual activity: Not Currently  Other Topics Concern    Not on file  Social History Narrative   Not on file   Social Drivers of Health   Financial Resource Strain: Low Risk  (11/21/2021)   Overall Financial Resource Strain (CARDIA)    Difficulty of Paying Living Expenses: Not very hard  Food Insecurity: No Food Insecurity (11/21/2021)   Hunger Vital Sign    Worried About Running Out of Food in the Last Year: Never true  Ran Out of Food in the Last Year: Never true  Transportation Needs: No Transportation Needs (11/21/2021)   PRAPARE - Administrator, Civil Service (Medical): No    Lack of Transportation (Non-Medical): No  Physical Activity: Not on file  Stress: Not on file  Social Connections: Not on file  Intimate Partner Violence: Not on file     OBSERVATIONS/OBJECTIVE:  BP (!) 163/86 Comment: MD notified  Pulse 69   Temp 98.7 F (37.1 C) (Temporal)   Resp 16   Wt 135 lb 4.8 oz (61.4 kg)   SpO2 100%   BMI 21.62 kg/m  GENERAL: Patient is a well appearing female in no acute distress HEENT:  Sclerae anicteric.  Oropharynx clear and moist. No ulcerations or evidence of oropharyngeal candidiasis. Neck is supple.  NODES:  No cervical, supraclavicular, or axillary lymphadenopathy palpated.  BREAST EXAM: Left breast status postlumpectomy and radiation no sign of local recurrence right breast is benign. LUNGS:  Clear to auscultation bilaterally.  No wheezes or rhonchi. HEART:  Regular rate and rhythm. No murmur appreciated. ABDOMEN:  Soft, nontender.  Positive, normoactive bowel sounds. No organomegaly palpated. MSK:  No focal spinal tenderness to palpation. Full range of motion bilaterally in the upper extremities. EXTREMITIES:  No peripheral edema.   SKIN:  Hives all over,  NEURO:  Nonfocal. Well oriented.  Appropriate affect.   LABORATORY DATA:  None for this visit.  DIAGNOSTIC IMAGING:  None for this visit.      ASSESSMENT AND PLAN:  Ms.. Charette is a pleasant 58 y.o. female with Stage IA left breast  invasive ductal carcinoma, ER+/PR+/HER2-, diagnosed in 10/2021, treated with lumpectomy, adjuvant radiation therapy, and anti-estrogen therapy with Anastrozole beginning in 03/2022.   Chronic Urticaria Persistent hives despite antihistamine therapy (Zyrtec 10mg  twice daily, Pepcid 20mg  twice daily). Recent reintroduction of seafood (salmon) in the diet led to a flare of hives. Blood work showed slight peanut allergy but no fin fish allergy. -Continue Zyrtec 10mg  twice daily and Pepcid 20mg  twice daily. -Consultation with allergist today for further management and possible consideration of Xolair. -Avoid seafood, particularly salmon, until further testing can be done.  Breast Cancer (PALB2 positive) On Anastrozole with some joint discomfort, but not debilitating. Mammograms alternating with MRIs due to PALB2 gene. -Continue Anastrozole. -Next breast MRI scheduled for April 30, 2023. -Follow-up in six months.  General Health Maintenance -Consider participation in pancreatic cancer study due to PALB2 gene. -Continue regular skin screenings with dermatologist.  Time spent: 30 min  *Total Encounter Time as defined by the Centers for Medicare and Medicaid Services includes, in addition to the face-to-face time of a patient visit (documented in the note above) non-face-to-face time: obtaining and reviewing outside history, ordering and reviewing medications, tests or procedures, care coordination (communications with other health care professionals or caregivers) and documentation in the medical record.

## 2023-04-15 NOTE — Progress Notes (Signed)
Follow Up Note  RE: Kari Torres MRN: [id number] DOB: [birth year] Date of Office Visit: 04/15/2023  Referring provider: Irven Coe, MD Primary care provider: Irven Coe, MD  Chief Complaint: Urticaria (Hive flare up that started last Wednesday.)  History of Present Illness: I had the pleasure of seeing Kari Torres for a follow up visit at the [facility] on 04/15/2023. She is a 58 y.o. female, who is being followed for urticaria, adverse food reactions, allergic rhinitis. Her previous allergy office visit was on 04/02/2023 with Dr. Selena Batten. Today is a new complaint visit of hive outbreak .  Discussed the use of AI scribe software for clinical note transcription with the patient, who gave verbal consent to proceed.  The patient, with a history of chronic hives, presented with a recent flare-up of symptoms. She reported a significant increase in hives on her lower torso, back, and the backs of her legs, which began after consuming salmon. The patient had not consumed salmon since the onset of her condition and prepared the meal with garlic powder, onion powder, salt, pepper, dill weed, and butter. Despite taking Pepcid and Zyrtec, the hives persisted and even worsened over the following days, spreading to the upper torso and legs.  The patient sought medical advice and was prescribed prednisone, which initially seemed to alleviate the symptoms. However, new outbreaks occurred around the collarbone, neck, and legs. Despite increasing the dosage of Zyrtec to 20mg  BID and completing the course of prednisone, the hives did not improve and new clusters appeared.  The patient denied any changes in her environment, such as new pets or changes in laundry detergent, lotion, or body wash. She also reported no changes in her medication regimen, except for the recent cessation of an omega-3 supplement due to its fish content with no improvement in hives. The patient expressed anxiety about the  persistent hives and the impact on her blood pressure, which was noted to be elevated today.   The patient's hives have been unresponsive to antihistamines and steroids.     Patient decreased to famotidine 10mg  last week.  On 1/12 took 1 pepcid and 2 zyrtecs. Monday had tuna sandwich - no issues  Tuesday patient had salmon, rice , asparagus. Woke up wed with hives on torso. Saw derm on wed for regular check up and hives clearing up.  Thursday woke up and it was worse. Restarted zyrtec 20mg  BID and famotidine 20mg  BID Started steroid pak. Friday hives almost gone.   Saturday - new outbreak  Went to work out and by pm - new hives.  Sunday had new ones.  Increased zyrtec to 40mg  BID, famotidine 20mg  BID Took last prednisone on Monday.  Woke up today with new clusters.  Assessment and Plan: Kari Torres is a 58 y.o. female with: Chronic urticaria Past history - Recurrent hives since mid-September, primarily on the torso, lasting a few hours each episode. Noted to worsen with salmon consumption. No other associated symptoms. No recent changes in diet, medications, or personal care products. 2024 bloodwork unremarkable except for positive shellfish and peanuts. Interim history - flared last week while trying to wean off antihistamines and consumed salmon. Persistent hives despite prednisone, Zyrtec, and Pepcid. No clear trigger identified. No systemic symptoms or anaphylaxis. Depo 80mg  IM given today. Start medrol pak.  Continue zyrtec (cetirizine) 20mg  at night and allegra 2 tablets total of 360mg  in the morning.  If symptoms are not controlled or causes drowsiness let us know. Continue  Pepcid (famotidine) 20mg  twice a day.  Avoid the following potential triggers: alcohol, tight clothing, NSAIDs, hot showers and getting overheated. Read about Xolair injections - handout given.  Other adverse food reactions, not elsewhere classified, subsequent encounter Past history - 2024 bloodwork  positive to peanut, clam, shrimp, scallop, crab, lobster, oyster. Borderline to macadamia, egg, wheat, corn and sesame seed. Negative to chocolate and finned fish. Known allergy to shellfish. Tolerates finned fish. Avoid all seafood and peanuts.  Hold off fish oil supplements.  Bloodwork was borderline to macadamia, egg, wheat, corn and sesame seed. Monitor symptoms after you eat these foods. You don't have to avoid them if you have no issues.  For mild symptoms you can take over the counter antihistamines such as Benadryl 1-2 tablets = 25-50mg  and monitor symptoms closely. If symptoms worsen or if you have severe symptoms including breathing issues, throat closure, significant swelling, whole body hives, severe diarrhea and vomiting, lightheadedness then inject epinephrine and seek immediate medical care afterwards. Emergency action plan in place.    Seasonal and perennial allergic rhinitis Past history - 2024 bloodwork was positive to dust mites, cat, dog, grass, cockroach, trees, ragweed. Continue environmental control measures. Use over the counter antihistamines such as Zyrtec (cetirizine), Claritin (loratadine), Allegra (fexofenadine), or Xyzal (levocetirizine) daily as needed. May take twice a day during allergy flares. May switch antihistamines every few months.  Elevated blood pressure reading Please follow up with PCP regarding this.    Return in about 2 weeks (around 04/29/2023).  Meds ordered this encounter  Medications   methylPREDNISolone (MEDROL DOSEPAK) 4 MG TBPK tablet    Sig: Take 6 tablets on day 1, 5 tablets on day 2, 4 tabs on day 3, 3 tabs on day 4, 2 tabs on day 5, 1 tab on day 6.    Dispense:  21 tablet    Refill:  0   methylPREDNISolone acetate (DEPO-MEDROL) injection 80 mg   Lab Orders  No laboratory test(s) ordered today    Diagnostics: None.   Medication List:  Current Outpatient Medications  Medication Sig Dispense Refill   anastrozole (ARIMIDEX) 1 MG  tablet TAKE 1 TABLET BY MOUTH EVERY DAY 90 tablet 3   Apple Cider Vinegar 500 MG TABS Take 500 mg by mouth daily. 2 gummys, orally once a day.     aspirin (ASPIRIN 81) 81 MG chewable tablet Chew 81 mg by mouth as needed.     atenolol (TENORMIN) 100 MG tablet Take 100 mg by mouth daily.     atorvastatin (LIPITOR) 20 MG tablet Take 20 mg by mouth daily.     cetirizine (ZYRTEC) 10 MG chewable tablet Chew 10 mg by mouth daily.     Cholecalciferol (VITAMIN D) 50 MCG (2000 UT) CAPS Take 2,000 Units by mouth daily. 2 gummies orally once a day.     Collagen-Vitamin C (COLLAGEN PLUS VITAMIN C PO) Take by mouth.     conjugated estrogens (PREMARIN) vaginal cream Place 1 Applicatorful vaginally 3 (three) times a week. Throw out applicator. Place small amount of cream on your finger tip and insert into the vagina 3 times a week at night. 42.5 g 1   dapagliflozin propanediol (FARXIGA) 5 MG TABS tablet Take 5 mg by mouth daily.     EPINEPHrine 0.3 mg/0.3 mL IJ SOAJ injection Inject 0.3 mg into the muscle as needed for anaphylaxis. 2 each 1   famotidine (PEPCID) 20 MG tablet Take 1 tablet (20 mg total) by mouth 2 (two) times  daily. 60 tablet 2   hydrochlorothiazide (HYDRODIURIL) 25 MG tablet Take 25 mg by mouth daily.     LUTEIN PO Take 20 mg by mouth daily.     metFORMIN (GLUCOPHAGE) 1000 MG tablet Take 1,000 mg by mouth 2 (two) times daily.     methylPREDNISolone (MEDROL DOSEPAK) 4 MG TBPK tablet Take 6 tablets on day 1, 5 tablets on day 2, 4 tabs on day 3, 3 tabs on day 4, 2 tabs on day 5, 1 tab on day 6. 21 tablet 0   Multiple Vitamins-Minerals (CENTRUM ADULTS PO) Take 1 tablet by mouth daily.     Probiotic Product (PROBIOTIC DAILY PO) Take 1 capsule by mouth daily.     TIADYLT ER 240 MG 24 hr capsule Take 240 mg by mouth daily.     Vaginal Lubricant (REVAREE) SUPP Place 1 suppository vaginally 2 (two) times a week.     No current facility-administered medications for this visit.   Allergies: Allergies   Allergen Reactions   Iodine Hives   Shellfish Allergy Hives   I reviewed her past medical history, social history, family history, and environmental history and no significant changes have been reported from her previous visit.  Review of Systems  Constitutional:  Negative for appetite change, chills, fever and unexpected weight change.  HENT:  Negative for congestion and rhinorrhea.   Eyes:  Negative for itching.  Respiratory:  Negative for cough, chest tightness, shortness of breath and wheezing.   Cardiovascular:  Negative for chest pain.  Gastrointestinal:  Negative for abdominal pain.  Genitourinary:  Negative for difficulty urinating.  Skin:  Positive for rash.  Allergic/Immunologic: Positive for environmental allergies and food allergies.  Neurological:  Negative for headaches.    Objective: BP (!) 164/94   Pulse 68   Temp 97.7 F (36.5 C)   Resp 16   SpO2 97%  There is no height or weight on file to calculate BMI. Physical Exam Vitals and nursing note reviewed.  Constitutional:      Appearance: Normal appearance. She is well-developed.  HENT:     Head: Normocephalic and atraumatic.     Right Ear: Tympanic membrane and external ear normal.     Left Ear: Tympanic membrane and external ear normal.     Nose: Nose normal.     Mouth/Throat:     Mouth: Mucous membranes are moist.     Pharynx: Oropharynx is clear.  Eyes:     Conjunctiva/sclera: Conjunctivae normal.  Cardiovascular:     Rate and Rhythm: Normal rate and regular rhythm.     Heart sounds: Normal heart sounds. No murmur heard.    No friction rub. No gallop.  Pulmonary:     Effort: Pulmonary effort is normal.     Breath sounds: Normal breath sounds. No wheezing, rhonchi or rales.  Musculoskeletal:     Cervical back: Neck supple.  Skin:    General: Skin is warm.     Findings: Rash present.     Comments: Diffuse hives on posterior neck, torso area.   Neurological:     Mental Status: She is alert and  oriented to person, place, and time.  Psychiatric:        Behavior: Behavior normal.    Previous notes and tests were reviewed. The plan was reviewed with the patient/family, and all questions/concerned were addressed.  It was my pleasure to see Kari Torres today and participate in her care. Please feel free to contact me with any questions or  concerns.  Sincerely,  Wyline Mood, DO Allergy & Immunology  [facility] office: [phone] [facility] office: [phone]

## 2023-04-16 ENCOUNTER — Ambulatory Visit: Payer: No Typology Code available for payment source | Admitting: Allergy

## 2023-04-25 ENCOUNTER — Encounter: Payer: Self-pay | Admitting: Adult Health

## 2023-04-29 NOTE — Progress Notes (Signed)
 Follow Up Note  RE: Kari Torres MRN: [id number] DOB: 1965-12-26 Date of Office Visit: 04/30/2023  Referring provider: Leonel Cole, MD Primary care provider: Leonel Cole, MD  Chief Complaint: Allergies (Follow up)  History of Present Illness: I had the pleasure of seeing Kari Torres for a follow up visit at the [facility] on 04/30/2023. She is a 58 y.o. female, who is being followed for chronic urticaria, adverse food reaction, allergic rhinitis. Her previous allergy  office visit was on 04/15/2023 with Dr. Luke. Today is a regular follow up visit.  Discussed the use of AI scribe software for clinical note transcription with the patient, who gave verbal consent to proceed.  She has been experiencing recurrent hives, initially responding to steroid treatment but recurring after tapering the medication. A steroid injection was administered on a Tuesday, followed by a steroid pack the next day. She remained hive-free from Wednesday night through Saturday night, but the hives returned on Sunday morning as the steroid dose was tapered. By Tuesday, the hives were severe again, leading her to add Benadryl to her regimen. The hives began to resolve by Thursday and were gone by Friday evening. She has been hive-free for five days.  She suspects corn as a potential trigger for her hives, as documented in her food journal. During each outbreak, she consumed corn or corn products, such as microwave popcorn and taco soup containing corn. She is uncertain if the reaction is due to corn itself or its derivatives, as her blood test results were borderline for corn allergy . She has been avoiding corn and corn products, including popcorn, corn chips, and corn tortillas, to see if this resolves her symptoms.  Her current medication regimen includes two Allegra in the morning, two Zyrtec at night, and Pepcid  twice a day.   She mentions a previous episode in December where she was mostly hive-free  while on Zyrtec and Pepcid , with a minor outbreak after consuming corn products at a [employer]. She is cautious about consuming corn and is exploring alternative snacks that do not contain corn.     Wondering if beer is a trigger. Avoiding seafood and peanuts.    Assessment and Plan: Kari Torres is a 58 y.o. female with: Chronic urticaria Past history - Recurrent hives since mid-September, primarily on the torso, lasting a few hours each episode. Noted to worsen with salmon consumption. No other associated symptoms. No recent changes in diet, medications, or personal care products. 2024 bloodwork unremarkable except for positive shellfish and peanuts. Breast cancer dx in 2023. Interim history - Recurrent hives despite steroid use. Patient suspects corn as a potential trigger. Currently hive-free for five days. Continue zyrtec (cetirizine) 20mg  at night and allegra 2 tablets total of 360mg  in the morning.  If symptoms are not controlled or causes drowsiness let us  know. Continue Pepcid  (famotidine ) 20mg  twice a day.  Avoid the following potential triggers: alcohol, tight clothing, NSAIDs, hot showers and getting overheated. If hives come back - recommend Xolair  injections next.   Once you are hive free for 1 month then you may use the following step down schedule:  Decrease Allegra to 180mg  in the morning. Continue zyrtec 20mg  at night, Pepcid  20mg  twice a day. If no hives/itching for 2 weeks then: Decrease zyrtec to 10mg  at night. Continue Allegra 180mg  in the morning, Pepcid  20mg  twice a day. If no hives/itching for 2 weeks then: Decrease Pepcid  to 20mg  once a day. Continue Allegra 180mg  in the  morning, zyrtec 10mg  at night. If no hives/itching for 2 weeks then: Stop Pepcid . Continue allegra 180mg  in the morning, zyrtec 10mg  at night. If no symptoms for 2 weeks then: Stop allegra. Continue zyrtec 10mg  at night. If no symptoms for 2 weeks then: Stop zyrtec.  If you get symptoms then go  back to the dose where you didn't have any symptoms.    Other adverse food reactions, not elsewhere classified, subsequent encounter Past history - 2024 bloodwork positive to peanut , clam, shrimp, scallop, crab, lobster, oyster. Borderline to macadamia, egg, wheat, corn and sesame seed. Negative to chocolate and finned fish. Known allergy  to shellfish. Tolerates finned fish. Interim history - concerned about corn now. Avoid all seafood, peanuts and corn.  Hold off fish oil.  Bloodwork was borderline to macadamia, egg, wheat, corn and sesame seed. Monitor symptoms after you eat these foods. You don't have to avoid them if you have no issues.  For mild symptoms you can take over the counter antihistamines such as Benadryl 1-2 tablets = 25-50mg  and monitor symptoms closely. If symptoms worsen or if you have severe symptoms including breathing issues, throat closure, significant swelling, whole body hives, severe diarrhea and vomiting, lightheadedness then inject epinephrine  and seek immediate medical care afterwards. Emergency action plan in place.    Seasonal and perennial allergic rhinitis Past history - 2024 bloodwork was positive to dust mites, cat, dog, grass, cockroach, trees, ragweed. Continue environmental control measures. Use over the counter antihistamines such as Zyrtec (cetirizine), Claritin (loratadine), Allegra (fexofenadine), or Xyzal (levocetirizine) daily as needed. May take twice a day during allergy  flares. May switch antihistamines every few months.   Return in about 4 months (around 08/28/2023).  No orders of the defined types were placed in this encounter.  Lab Orders  No laboratory test(s) ordered today    Diagnostics: None.   Medication List:  Current Outpatient Medications  Medication Sig Dispense Refill   anastrozole  (ARIMIDEX ) 1 MG tablet TAKE 1 TABLET BY MOUTH EVERY DAY 90 tablet 3   Apple Cider Vinegar 500 MG TABS Take 500 mg by mouth daily. 2 gummys, orally  once a day.     aspirin (ASPIRIN 81) 81 MG chewable tablet Chew 81 mg by mouth as needed.     atenolol (TENORMIN) 100 MG tablet Take 100 mg by mouth daily.     atorvastatin (LIPITOR) 20 MG tablet Take 20 mg by mouth daily.     cetirizine (ZYRTEC) 10 MG chewable tablet Chew 10 mg by mouth daily.     Cholecalciferol (VITAMIN D) 50 MCG (2000 UT) CAPS Take 2,000 Units by mouth daily. 2 gummies orally once a day.     Collagen-Vitamin C (COLLAGEN PLUS VITAMIN C PO) Take by mouth.     conjugated estrogens  (PREMARIN ) vaginal cream Place 1 Applicatorful vaginally 3 (three) times a week. Throw out applicator. Place small amount of cream on your finger tip and insert into the vagina 3 times a week at night. 42.5 g 1   dapagliflozin propanediol (FARXIGA) 5 MG TABS tablet Take 5 mg by mouth daily.     EPINEPHrine  0.3 mg/0.3 mL IJ SOAJ injection Inject 0.3 mg into the muscle as needed for anaphylaxis. 2 each 1   famotidine  (PEPCID ) 20 MG tablet Take 1 tablet (20 mg total) by mouth 2 (two) times daily. 60 tablet 2   hydrochlorothiazide  (HYDRODIURIL ) 25 MG tablet Take 25 mg by mouth daily.     LUTEIN PO Take 20 mg by mouth daily.  metFORMIN (GLUCOPHAGE) 1000 MG tablet Take 1,000 mg by mouth 2 (two) times daily.     Multiple Vitamins-Minerals (CENTRUM ADULTS PO) Take 1 tablet by mouth daily.     Probiotic Product (PROBIOTIC DAILY PO) Take 1 capsule by mouth daily.     TIADYLT ER 240 MG 24 hr capsule Take 240 mg by mouth daily.     Vaginal Lubricant (REVAREE) SUPP Place 1 suppository vaginally 2 (two) times a week.     No current facility-administered medications for this visit.   Allergies: Allergies  Allergen Reactions   Shellfish Allergy  Hives   Corn-Containing Products     ? hives   Peanut  (Diagnostic) Hives and Itching   I reviewed her past medical history, social history, family history, and environmental history and no significant changes have been reported from her previous visit.  Review of  Systems  Constitutional:  Negative for appetite change, chills, fever and unexpected weight change.  HENT:  Negative for congestion and rhinorrhea.   Eyes:  Negative for itching.  Respiratory:  Negative for cough, chest tightness, shortness of breath and wheezing.   Cardiovascular:  Negative for chest pain.  Gastrointestinal:  Negative for abdominal pain.  Genitourinary:  Negative for difficulty urinating.  Skin:  Negative for rash.  Allergic/Immunologic: Positive for environmental allergies and food allergies.  Neurological:  Negative for headaches.    Objective: BP 130/74   Pulse 68   Temp 97.7 F (36.5 C) (Temporal)   Resp 12   SpO2 99%  There is no height or weight on file to calculate BMI. Physical Exam Vitals and nursing note reviewed.  Constitutional:      Appearance: Normal appearance. She is well-developed.  HENT:     Head: Normocephalic and atraumatic.     Right Ear: Tympanic membrane and external ear normal.     Left Ear: Tympanic membrane and external ear normal.     Nose: Nose normal.     Mouth/Throat:     Mouth: Mucous membranes are moist.     Pharynx: Oropharynx is clear.  Eyes:     Conjunctiva/sclera: Conjunctivae normal.  Cardiovascular:     Rate and Rhythm: Normal rate and regular rhythm.     Heart sounds: Normal heart sounds. No murmur heard.    No friction rub. No gallop.  Pulmonary:     Effort: Pulmonary effort is normal.     Breath sounds: Normal breath sounds. No wheezing, rhonchi or rales.  Musculoskeletal:     Cervical back: Neck supple.  Skin:    General: Skin is warm.     Findings: No rash.  Neurological:     Mental Status: She is alert and oriented to person, place, and time.  Psychiatric:        Behavior: Behavior normal.    Previous notes and tests were reviewed. The plan was reviewed with the patient/family, and all questions/concerned were addressed.  It was my pleasure to see Maudell today and participate in her care. Please feel  free to contact me with any questions or concerns.  Sincerely,  Orlan Cramp, DO Allergy  & Immunology  [facility]  [place] office: [phone] [facility] office: [phone]

## 2023-04-30 ENCOUNTER — Ambulatory Visit
Admission: RE | Admit: 2023-04-30 | Discharge: 2023-04-30 | Discharge status: Home / Self Care | Payer: No Typology Code available for payment source | Source: Ambulatory Visit | Attending: Adult Health | Admitting: Adult Health

## 2023-04-30 ENCOUNTER — Encounter: Payer: Self-pay | Admitting: Allergy

## 2023-04-30 ENCOUNTER — Ambulatory Visit (INDEPENDENT_AMBULATORY_CARE_PROVIDER_SITE_OTHER): Payer: No Typology Code available for payment source | Admitting: Allergy

## 2023-04-30 VITALS — BP 130/74 | HR 68 | Temp 97.7°F | Resp 12

## 2023-04-30 DIAGNOSIS — J302 Other seasonal allergic rhinitis: Secondary | ICD-10-CM | POA: Diagnosis not present

## 2023-04-30 DIAGNOSIS — L508 Other urticaria: Secondary | ICD-10-CM | POA: Diagnosis not present

## 2023-04-30 DIAGNOSIS — J3089 Other allergic rhinitis: Secondary | ICD-10-CM | POA: Diagnosis not present

## 2023-04-30 DIAGNOSIS — C50412 Malignant neoplasm of upper-outer quadrant of left female breast: Secondary | ICD-10-CM

## 2023-04-30 DIAGNOSIS — Z1509 Genetic susceptibility to other malignant neoplasm: Secondary | ICD-10-CM

## 2023-04-30 DIAGNOSIS — T781XXD Other adverse food reactions, not elsewhere classified, subsequent encounter: Secondary | ICD-10-CM | POA: Diagnosis not present

## 2023-04-30 MED ORDER — GADOPICLENOL 0.5 MMOL/ML IV SOLN
6.0000 mL | Freq: Once | INTRAVENOUS | Status: AC | PRN
  Administered 2023-04-30: 6 mL via INTRAVENOUS

## 2023-04-30 NOTE — Patient Instructions (Addendum)
 Hives Continue zyrtec (cetirizine) 20mg  at night and allegra 2 tablets total of 360mg  in the morning.  If symptoms are not controlled or causes drowsiness let us  know. Continue Pepcid  (famotidine ) 20mg  twice a day.  Avoid the following potential triggers: alcohol, tight clothing, NSAIDs, hot showers and getting overheated. If hives come back - recommend Xolair  injections next.   Once you are hive free for 1 month then you may use the following step down schedule:  Decrease Allegra to 180mg  in the morning. Continue zyrtec 20mg  at night, Pepcid  20mg  twice a day. If no hives/itching for 2 weeks then: Decrease zyrtec to 10mg  at night. Continue Allegra 180mg  in the morning, Pepcid  20mg  twice a day. If no hives/itching for 2 weeks then: Decrease Pepcid  to 20mg  once a day. Continue Allegra 180mg  in the morning, zyrtec 10mg  at night. If no hives/itching for 2 weeks then: Stop Pepcid . Continue allegra 180mg  in the morning, zyrtec 10mg  at night. If no symptoms for 2 weeks then: Stop allegra. Continue zyrtec 10mg  at night. If no symptoms for 2 weeks then: Stop zyrtec.  If you get symptoms then go back to the dose where you didn't have any symptoms.   Food Avoid all seafood, peanuts and corn.  Hold off fish oil.  Bloodwork was borderline to macadamia, egg, wheat, corn and sesame seed. Monitor symptoms after you eat these foods. You don't have to avoid them if you have no issues.  For mild symptoms you can take over the counter antihistamines such as Benadryl 1-2 tablets = 25-50mg  and monitor symptoms closely. If symptoms worsen or if you have severe symptoms including breathing issues, throat closure, significant swelling, whole body hives, severe diarrhea and vomiting, lightheadedness then inject epinephrine  and seek immediate medical care afterwards. Emergency action plan in place.   Environmental allergies 2024 bloodwork was positive to dust mites, cat, dog, grass, cockroach, trees,  ragweed. Continue environmental control measures. Use over the counter antihistamines such as Zyrtec (cetirizine), Claritin (loratadine), Allegra (fexofenadine), or Xyzal (levocetirizine) daily as needed. May take twice a day during allergy  flares. May switch antihistamines every few months.  Return in about 4 months (around 08/28/2023).

## 2023-05-01 ENCOUNTER — Telehealth: Payer: Self-pay

## 2023-05-01 NOTE — Telephone Encounter (Addendum)
 Called pt per Np message below.Pt is in agreement of an Mri. Informed pt that the order would be put in. Pt verbalized understanding.----- Message from Morna JAYSON Kendall sent at 05/01/2023 11:59 AM EST ----- Negative MRI, please share good news with patients ----- Message ----- From: Interface, Rad Results In Sent: 04/30/2023  10:24 AM EST To: Morna Dalton Kendall, NP

## 2023-05-01 NOTE — Telephone Encounter (Signed)
 I"m confused.  Patient underwent the MRI that was  negative and message requested you give them the good news.  Can you clarify?

## 2023-05-01 NOTE — Telephone Encounter (Addendum)
 Called pt per Np message below. Lvm with the results after pt identified herself. Advise her to give a call back if she had any questions or concerns.----- Message from Morna JAYSON Kendall sent at 05/01/2023 11:59 AM EST ----- Negative MRI, please share good news with patients ----- Message ----- From: Interface, Rad Results In Sent: 04/30/2023  10:24 AM EST To: Morna Dalton Kendall, NP

## 2023-05-02 NOTE — Telephone Encounter (Signed)
 Enter in error

## 2023-05-28 ENCOUNTER — Encounter: Payer: Self-pay | Admitting: Genetic Counselor

## 2023-05-28 ENCOUNTER — Other Ambulatory Visit: Payer: Self-pay | Admitting: Allergy

## 2023-07-07 ENCOUNTER — Ambulatory Visit: Payer: Self-pay | Attending: General Surgery

## 2023-07-07 VITALS — Wt 131.1 lb

## 2023-07-07 DIAGNOSIS — Z483 Aftercare following surgery for neoplasm: Secondary | ICD-10-CM | POA: Insufficient documentation

## 2023-07-07 NOTE — Therapy (Signed)
 OUTPATIENT PHYSICAL THERAPY SOZO SCREENING NOTE   Patient Name: Kari Torres MRN: [id number] DOB:03-23-1966, 58 y.o., female Today's Date: 07/07/2023  PCP: Benedetto Brady, MD REFERRING PROVIDER: Lockie Rima, MD   PT End of Session - 07/07/23 1629     Visit Number 7   # unchanged due to screen only   PT Start Time 1628    PT Stop Time 1632    PT Time Calculation (min) 4 min    Activity Tolerance Patient tolerated treatment well    Behavior During Therapy [facility] for tasks assessed/performed             Past Medical History:  Diagnosis Date   Allergic rhinitis due to pollen    Bloody discharge from left nipple 01/29/2017   Breast cancer (HCC)    Diabetes mellitus without complication (HCC)    Family history of breast cancer 11/21/2021   Heart murmur    Hypercholesterolemia    Hypertension    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   Urticaria    Past Surgical History:  Procedure Laterality Date   BREAST LUMPECTOMY Left 01/29/2017   Procedure: LEFT BREAST LUMPECTOMY SUBAREOLAR DISSECTION ERAS PATHWAY;  Surgeon: Boyce Byes, MD;  Location: [facility];  Service: General;  Laterality: Left;  LMA   BREAST LUMPECTOMY WITH RADIOACTIVE SEED AND SENTINEL LYMPH NODE BIOPSY Left 12/06/2021   Procedure: LEFT BREAST LUMPECTOMY WITH RADIOACTIVE SEED AND SENTINEL LYMPH NODE BIOPSY;  Surgeon: Lockie Rima, MD;  Location: [facility];  Service: General;  Laterality: Left;   BREAST LUMPECTOMY WITH RADIOACTIVE SEED LOCALIZATION Left 09/15/2015   Procedure: LEFT BREAST LUMPECTOMY WITH RADIOACTIVE SEED LOCALIZATION;  Surgeon: Boyce Byes, MD;  Location:  SURGERY CENTER;  Service: General;  Laterality: Left;   CESAREAN SECTION     ROBOTIC ASSISTED SALPINGO OOPHERECTOMY Bilateral 07/03/2022   Procedure: XI ROBOTIC ASSISTED SALPINGO OOPHORECTOMY;  Surgeon: Suzi Essex, MD;  Location: [facility];  Service: Gynecology;  Laterality: Bilateral;    WISDOM TOOTH EXTRACTION     Patient Active Problem List   Diagnosis Date Noted   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   Genetic testing 11/30/2021   Family history of breast cancer 11/21/2021   Family history of brain cancer 11/21/2021   Family history of bladder cancer 11/21/2021   Malignant neoplasm of upper-outer quadrant of left breast in female, estrogen receptor positive (HCC) 11/19/2021   Hypertensive disorder 07/12/2021   Diabetes mellitus (HCC) 07/12/2021   Bloody discharge from left nipple 01/29/2017   Uncontrolled 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 It measures 1.6 cm and is located in the upper outer quadrant. It is ER/PR positive and HER2 negative with a Ki67 of 5%. She is s/p left lumpectomy with SLNB on 12/06/2021 with 0/3 LN. She  does not require chemo, but will have radiation.She also has diabetes and hypertension I ddi well with my ROM initially, and lost some when the hematoma happened. I have been doing the exercises.   PRECAUTIONS: left UE Lymphedema risk, None  SUBJECTIVE: Pt returns for her 3 month L-Dex screen.   PAIN:  Are you having  pain? No  SOZO SCREENING: Patient was assessed today using the SOZO machine to determine the lymphedema index score. This was compared to her baseline score. It was determined that she is within the recommended range when compared to her baseline and no further action is needed at this time. She will continue SOZO screenings. These are done every 3 months for 2 years post operatively followed by every 6 months for 2 years, and  then annually.   L-DEX FLOWSHEETS - 07/07/23 1600       L-DEX LYMPHEDEMA SCREENING   Measurement Type Unilateral    L-DEX MEASUREMENT EXTREMITY Upper Extremity    POSITION  Standing    DOMINANT SIDE Right    At Risk Side Left    BASELINE SCORE (UNILATERAL) 0.6    L-DEX SCORE (UNILATERAL) -2.6    VALUE CHANGE (UNILAT) -3.2            P: Possibly start 6 month screens after next 3 month.    Denyce Flank, PTA 07/07/2023, 4:30 PM

## 2023-07-30 ENCOUNTER — Ambulatory Visit: Payer: No Typology Code available for payment source | Admitting: Allergy

## 2023-08-04 NOTE — Progress Notes (Unsigned)
 Follow Up Note  RE: METRA MOST MRN: [id number] DOB: 07-28-1965 Date of Office Visit: 08/05/2023  Referring provider: Benedetto Brady, MD Primary care provider: Benedetto Brady, MD  Chief Complaint: No chief complaint on file.  History of Present Illness: I had the pleasure of seeing Kari Torres for a follow up visit at the [facility] on 08/04/2023. She is a 58 y.o. female, who is being followed for chronic urticaria, adverse food reaction, allergic rhinitis. Her previous allergy  office visit was on 04/30/2023 with Dr. Burdette Carolin. Today is a regular follow up visit.  Discussed the use of AI scribe software for clinical note transcription with the patient, who gave verbal consent to proceed.  History of Present Illness            ***  Assessment and Plan: Kari Torres is a 58 y.o. female with: Chronic urticaria Past history - Recurrent hives since mid-September, primarily on the torso, lasting a few hours each episode. Noted to worsen with salmon consumption. No other associated symptoms. No recent changes in diet, medications, or personal care products. 2024 bloodwork unremarkable except for positive shellfish and peanuts. Breast cancer dx in 2023. Interim history - Recurrent hives despite steroid use. Patient suspects corn as a potential trigger. Currently hive-free for five days. Continue zyrtec (cetirizine) 20mg  at night and allegra 2 tablets total of 360mg  in the morning.  If symptoms are not controlled or causes drowsiness let us  know. Continue Pepcid  (famotidine ) 20mg  twice a day.  Avoid the following potential triggers: alcohol, tight clothing, NSAIDs, hot showers and getting overheated. If hives come back - recommend Xolair injections next.    Once you are hive free for 1 month then you may use the following step down schedule:  Decrease Allegra to 180mg  in the morning. Continue zyrtec 20mg  at night, Pepcid  20mg  twice a day. If no hives/itching for 2 weeks then: Decrease  zyrtec to 10mg  at night. Continue Allegra 180mg  in the morning, Pepcid  20mg  twice a day. If no hives/itching for 2 weeks then: Decrease Pepcid  to 20mg  once a day. Continue Allegra 180mg  in the morning, zyrtec 10mg  at night. If no hives/itching for 2 weeks then: Stop Pepcid . Continue allegra 180mg  in the morning, zyrtec 10mg  at night. If no symptoms for 2 weeks then: Stop allegra. Continue zyrtec 10mg  at night. If no symptoms for 2 weeks then: Stop zyrtec.  If you get symptoms then go back to the dose where you didn't have any symptoms.    Other adverse food reactions, not elsewhere classified, subsequent encounter Past history - 2024 bloodwork positive to peanut , clam, shrimp, scallop, crab, lobster, oyster. Borderline to macadamia, egg, wheat, corn and sesame seed. Negative to chocolate and finned fish. Known allergy  to shellfish. Tolerates finned fish. Interim history - concerned about corn now. Avoid all seafood, peanuts and corn.  Hold off fish oil.  Bloodwork was borderline to macadamia, egg, wheat, corn and sesame seed. Monitor symptoms after you eat these foods. You don't have to avoid them if you have no issues.  For mild symptoms you can take over the counter antihistamines such as Benadryl 1-2 tablets = 25-50mg  and monitor symptoms closely. If symptoms worsen or if you have severe symptoms including breathing issues, throat closure, significant swelling, whole body hives, severe diarrhea and vomiting, lightheadedness then inject epinephrine  and seek immediate medical care afterwards. Emergency action plan in place.    Seasonal and perennial allergic rhinitis Past history - 2024 bloodwork positive to  dust mites, cat, dog, grass, cockroach, trees, ragweed. Continue environmental control measures. Use over the counter antihistamines such as Zyrtec (cetirizine), Claritin (loratadine), Allegra (fexofenadine), or Xyzal (levocetirizine) daily as needed. May take twice a day during allergy   flares. May switch antihistamines every few months. Assessment and Plan              No follow-ups on file.  No orders of the defined types were placed in this encounter.  Lab Orders  No laboratory test(s) ordered today    Diagnostics: Spirometry:  Tracings reviewed. Her effort: {Blank single:19197::"Good reproducible efforts.","It was hard to get consistent efforts and there is a question as to whether this reflects a maximal maneuver.","Poor effort, data can not be interpreted."} FVC: ***L FEV1: ***L, ***% predicted FEV1/FVC ratio: ***% Interpretation: {Blank single:19197::"Spirometry consistent with mild obstructive disease","Spirometry consistent with moderate obstructive disease","Spirometry consistent with severe obstructive disease","Spirometry consistent with possible restrictive disease","Spirometry consistent with mixed obstructive and restrictive disease","Spirometry uninterpretable due to technique","Spirometry consistent with normal pattern","No overt abnormalities noted given today's efforts"}.  Please see scanned spirometry results for details.  Skin Testing: {Blank single:19197::"Select foods","Environmental allergy  panel","Environmental allergy  panel and select foods","Food allergy  panel","None","Deferred due to recent antihistamines use"}. *** Results discussed with patient/family.   Medication List:  Current Outpatient Medications  Medication Sig Dispense Refill  . anastrozole  (ARIMIDEX ) 1 MG tablet TAKE 1 TABLET BY MOUTH EVERY DAY 90 tablet 3  . Apple Cider Vinegar 500 MG TABS Take 500 mg by mouth daily. 2 gummys, orally once a day.    Aaron Aas aspirin (ASPIRIN 81) 81 MG chewable tablet Chew 81 mg by mouth as needed.    Aaron Aas atenolol (TENORMIN) 100 MG tablet Take 100 mg by mouth daily.    Aaron Aas atorvastatin (LIPITOR) 20 MG tablet Take 20 mg by mouth daily.    . cetirizine (ZYRTEC) 10 MG chewable tablet Chew 10 mg by mouth daily.    . Cholecalciferol (VITAMIN D) 50 MCG  (2000 UT) CAPS Take 2,000 Units by mouth daily. 2 gummies orally once a day.    . Collagen-Vitamin C (COLLAGEN PLUS VITAMIN C PO) Take by mouth.    . conjugated estrogens  (PREMARIN ) vaginal cream Place 1 Applicatorful vaginally 3 (three) times a week. Throw out applicator. Place small amount of cream on your finger tip and insert into the vagina 3 times a week at night. 42.5 g 1  . dapagliflozin propanediol (FARXIGA) 5 MG TABS tablet Take 5 mg by mouth daily.    . EPINEPHrine  0.3 mg/0.3 mL IJ SOAJ injection Inject 0.3 mg into the muscle as needed for anaphylaxis. 2 each 1  . famotidine  (PEPCID ) 20 MG tablet TAKE 1 TABLET BY MOUTH TWICE A DAY 180 tablet 0  . hydrochlorothiazide (HYDRODIURIL) 25 MG tablet Take 25 mg by mouth daily.    . LUTEIN PO Take 20 mg by mouth daily.    . metFORMIN (GLUCOPHAGE) 1000 MG tablet Take 1,000 mg by mouth 2 (two) times daily.    . Multiple Vitamins-Minerals (CENTRUM ADULTS PO) Take 1 tablet by mouth daily.    . Probiotic Product (PROBIOTIC DAILY PO) Take 1 capsule by mouth daily.    . TIADYLT ER 240 MG 24 hr capsule Take 240 mg by mouth daily.    . Vaginal Lubricant (REVAREE) SUPP Place 1 suppository vaginally 2 (two) times a week.     No current facility-administered medications for this visit.   Allergies: Allergies  Allergen Reactions  . Shellfish Allergy  Hives  . Corn-Containing Products     ?  hives  . Peanut  (Diagnostic) Hives and Itching   I reviewed her past medical history, social history, family history, and environmental history and no significant changes have been reported from her previous visit.  Review of Systems  Constitutional:  Negative for appetite change, chills, fever and unexpected weight change.  HENT:  Negative for congestion and rhinorrhea.   Eyes:  Negative for itching.  Respiratory:  Negative for cough, chest tightness, shortness of breath and wheezing.   Cardiovascular:  Negative for chest pain.  Gastrointestinal:  Negative for  abdominal pain.  Genitourinary:  Negative for difficulty urinating.  Skin:  Negative for rash.  Allergic/Immunologic: Positive for environmental allergies and food allergies.  Neurological:  Negative for headaches.   Objective: There were no vitals taken for this visit. There is no height or weight on file to calculate BMI. Physical Exam Vitals and nursing note reviewed.  Constitutional:      Appearance: Normal appearance. She is well-developed.  HENT:     Head: Normocephalic and atraumatic.     Right Ear: Tympanic membrane and external ear normal.     Left Ear: Tympanic membrane and external ear normal.     Nose: Nose normal.     Mouth/Throat:     Mouth: Mucous membranes are moist.     Pharynx: Oropharynx is clear.  Eyes:     Conjunctiva/sclera: Conjunctivae normal.  Cardiovascular:     Rate and Rhythm: Normal rate and regular rhythm.     Heart sounds: Normal heart sounds. No murmur heard.    No friction rub. No gallop.  Pulmonary:     Effort: Pulmonary effort is normal.     Breath sounds: Normal breath sounds. No wheezing, rhonchi or rales.  Musculoskeletal:     Cervical back: Neck supple.  Skin:    General: Skin is warm.     Findings: No rash.  Neurological:     Mental Status: She is alert and oriented to person, place, and time.  Psychiatric:        Behavior: Behavior normal.  Previous notes and tests were reviewed. The plan was reviewed with the patient/family, and all questions/concerned were addressed.  It was my pleasure to see Kari Torres today and participate in her care. Please feel free to contact me with any questions or concerns.  Sincerely,  Eudelia Hero, DO Allergy  & Immunology  [facility]  [place] office: [phone] [facility] office: [phone]

## 2023-08-05 ENCOUNTER — Encounter: Payer: Self-pay | Admitting: Allergy

## 2023-08-05 ENCOUNTER — Ambulatory Visit (INDEPENDENT_AMBULATORY_CARE_PROVIDER_SITE_OTHER): Admitting: Allergy

## 2023-08-05 VITALS — BP 122/82 | HR 68 | Temp 97.8°F | Wt 132.2 lb

## 2023-08-05 DIAGNOSIS — J3089 Other allergic rhinitis: Secondary | ICD-10-CM | POA: Diagnosis not present

## 2023-08-05 DIAGNOSIS — J302 Other seasonal allergic rhinitis: Secondary | ICD-10-CM

## 2023-08-05 DIAGNOSIS — L508 Other urticaria: Secondary | ICD-10-CM

## 2023-08-05 DIAGNOSIS — T781XXD Other adverse food reactions, not elsewhere classified, subsequent encounter: Secondary | ICD-10-CM | POA: Diagnosis not present

## 2023-08-05 DIAGNOSIS — T7819XD Other adverse food reactions, not elsewhere classified, subsequent encounter: Secondary | ICD-10-CM

## 2023-08-05 NOTE — Patient Instructions (Addendum)
 Hives Continue zyrtec (cetirizine) 10mg  at night and allegra 180mg  in the morning.  Continue Pepcid  (famotidine ) 20mg  twice a day.  Avoid the following potential triggers: alcohol, tight clothing, NSAIDs, hot showers and getting overheated. Start Xolair 300mg  every 4 weeks. Tammy will be in touch with you regarding coverage.   Once you are hive free for 1 month then you may use the following step down schedule:  Decrease Pepcid  to 20mg  once a day. Continue Allegra 180mg  in the morning, zyrtec 10mg  at night. If no hives/itching for 2 weeks then: Stop Pepcid . Continue allegra 180mg  in the morning, zyrtec 10mg  at night. If no symptoms for 2 weeks then: Stop allegra. Continue zyrtec 10mg  at night. If no symptoms for 2 weeks then: Stop zyrtec.  If you get symptoms then go back to the dose where you didn't have any symptoms.   Food Avoid all seafood, peanuts.  Bloodwork was borderline to macadamia, egg, wheat, corn and sesame seed. Monitor symptoms after you eat these foods. You don't have to avoid them if you have no issues.  For mild symptoms you can take over the counter antihistamines such as Benadryl 1-2 tablets = 25-50mg  and monitor symptoms closely. If symptoms worsen or if you have severe symptoms including breathing issues, throat closure, significant swelling, whole body hives, severe diarrhea and vomiting, lightheadedness then inject epinephrine  and seek immediate medical care afterwards. Emergency action plan in place.   Environmental allergies 2024 bloodwork positive to dust mites, cat, dog, grass, cockroach, trees, ragweed. Continue environmental control measures. Use over the counter antihistamines such as Zyrtec (cetirizine), Claritin (loratadine), Allegra (fexofenadine), or Xyzal (levocetirizine) daily as needed. May take twice a day during allergy  flares. May switch antihistamines every few months.  Return in about 3 months (around 11/05/2023).

## 2023-08-07 ENCOUNTER — Encounter: Payer: Self-pay | Admitting: Allergy

## 2023-08-14 ENCOUNTER — Telehealth: Payer: Self-pay | Admitting: Hematology and Oncology

## 2023-08-14 NOTE — Telephone Encounter (Signed)
 Angeliyah's voicemail box is full. I attempted to reach Kari Torres per a new referral received.

## 2023-08-23 ENCOUNTER — Other Ambulatory Visit: Payer: Self-pay | Admitting: Allergy

## 2023-08-27 ENCOUNTER — Ambulatory Visit: Payer: No Typology Code available for payment source | Admitting: Allergy

## 2023-08-27 ENCOUNTER — Telehealth: Payer: Self-pay | Admitting: *Deleted

## 2023-08-27 NOTE — Telephone Encounter (Signed)
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 Having breakthrough hives despite high dose antihistamines. Thank you.

## 2023-08-27 NOTE — Telephone Encounter (Signed)
 L/m for patient to advise approval, copay card and submit for Xolair to [facility] specialty pharmacy

## 2023-08-28 MED ORDER — XOLAIR 300 MG/2ML ~~LOC~~ SOSY: 300.0000 mg | PREFILLED_SYRINGE | SUBCUTANEOUS | 11 refills | Status: AC

## 2023-08-28 NOTE — Telephone Encounter (Signed)
 Spoke to patient and advised approval, copay card and submit to [facility] specialty. Will reach out once delivery set to make appt to start therapy with one hour wait and at least 3 injes in clinic then can self admin if preferred

## 2023-08-28 NOTE — Telephone Encounter (Signed)
 PT called to follow up on medication, was waiting to hear back from [facility]. I advised Tammy attempted call yesterday and left direct phone number to contact, provided to her and she thanked and advised would call Tammy directly.

## 2023-08-31 ENCOUNTER — Ambulatory Visit
Admission: EM | Admit: 2023-08-31 | Discharge: 2023-08-31 | Disposition: A | Discharge status: Home / Self Care | Attending: Family Medicine | Admitting: Family Medicine

## 2023-08-31 DIAGNOSIS — L508 Other urticaria: Secondary | ICD-10-CM | POA: Diagnosis not present

## 2023-08-31 MED ORDER — METHYLPREDNISOLONE ACETATE 80 MG/ML IJ SUSP
80.0000 mg | Freq: Once | INTRAMUSCULAR | Status: AC
  Administered 2023-08-31: 80 mg via INTRAMUSCULAR

## 2023-08-31 MED ORDER — HYDROCHLOROTHIAZIDE 12.5 MG PO TABS: 12.5000 mg | ORAL_TABLET | Freq: Every day | ORAL | 0 refills | Status: AC

## 2023-08-31 NOTE — Discharge Instructions (Addendum)
 Use an extra half dose of hydrochlorothiazide at 12.5mg  once daily to help manage the additional increase in blood pressure from steroid use. Keep taking your regular hive medications.

## 2023-08-31 NOTE — ED Provider Notes (Signed)
 [facility]RG[facility]  Note:  This document was prepared using Conservation officer, historic buildings and may include unintentional dictation errors.  MRN: [id number] DOB: [birth year]  Subjective:   Kari Torres is a 58 y.o. female presenting for 1 day.  Acute on chronic urticarial lesions.  Patient has significant history of the same.  She is working with a Armed forces operational officer extensively to manage this.  Takes multiple antihistamines daily.  Normally they can control her chronic urticaria but this particular episode is flaring up.  Denies eating any new foods, starting new medications, exposure to poisonous plants, new hygiene products, new cleaning products or detergents.  No shortness of breath, chest tightness, nausea, vomiting, abdominal pain, facial or oral swelling.  Patient last had A1c at 6.5% recently within the past 3 months.   No current facility-administered medications for this encounter.  Current Outpatient Medications:    anastrozole  (ARIMIDEX ) 1 MG tablet, TAKE 1 TABLET BY MOUTH EVERY DAY, Disp: 90 tablet, Rfl: 3   Apple Cider Vinegar 500 MG TABS, Take 500 mg by mouth daily. 2 gummys, orally once a day., Disp: , Rfl:    aspirin (ASPIRIN 81) 81 MG chewable tablet, Chew 81 mg by mouth as needed., Disp: , Rfl:    atenolol (TENORMIN) 100 MG tablet, Take 100 mg by mouth daily., Disp: , Rfl:    atorvastatin (LIPITOR) 20 MG tablet, Take 20 mg by mouth daily., Disp: , Rfl:    cetirizine (ZYRTEC) 10 MG chewable tablet, Chew 10 mg by mouth daily., Disp: , Rfl:    Cholecalciferol (VITAMIN D) 50 MCG (2000 UT) CAPS, Take 2,000 Units by mouth daily. 2 gummies orally once a day., Disp: , Rfl:    Collagen-Vitamin C (COLLAGEN PLUS VITAMIN C PO), Take by mouth., Disp: , Rfl:    conjugated estrogens  (PREMARIN ) vaginal cream, Place 1 Applicatorful vaginally 3 (three) times a week. Throw out applicator. Place small amount of cream on your finger tip and insert into the vagina 3 times a week at  night., Disp: 42.5 g, Rfl: 1   dapagliflozin propanediol (FARXIGA) 5 MG TABS tablet, Take 5 mg by mouth daily., Disp: , Rfl:    empagliflozin (JARDIANCE) 10 MG TABS tablet, 1 tablet Orally Once a day (Patient not taking: Reported on 08/05/2023), Disp: , Rfl:    EPINEPHrine  0.3 mg/0.3 mL IJ SOAJ injection, Inject 0.3 mg into the muscle as needed for anaphylaxis., Disp: 2 each, Rfl: 1   famotidine  (PEPCID ) 20 MG tablet, TAKE 1 TABLET BY MOUTH TWICE A DAY, Disp: 180 tablet, Rfl: 0   fexofenadine (ALLEGRA ALLERGY ) 180 MG tablet, 1 tablet Swallow whole with water ; do not take with fruit juices. Orally Once a day, Disp: , Rfl:    hydrochlorothiazide (HYDRODIURIL) 25 MG tablet, Take 25 mg by mouth daily., Disp: , Rfl:    LUTEIN PO, Take 20 mg by mouth daily., Disp: , Rfl:    metFORMIN (GLUCOPHAGE) 1000 MG tablet, Take 1,000 mg by mouth 2 (two) times daily., Disp: , Rfl:    Multiple Vitamins-Minerals (CENTRUM ADULTS PO), Take 1 tablet by mouth daily., Disp: , Rfl:    omalizumab  (XOLAIR ) 300 MG/2  ML prefilled syringe, Inject 300 mg into the skin every 28 (twenty-eight) days., Disp: 2 mL, Rfl: 11   Probiotic Product (PROBIOTIC DAILY PO), Take 1 capsule by mouth daily., Disp: , Rfl:    TIADYLT ER 240 MG 24 hr capsule, Take 240 mg by mouth daily., Disp: , Rfl:  Vaginal Lubricant (REVAREE) SUPP, Place 1 suppository vaginally 2 (two) times a week., Disp: , Rfl:    Allergies  Allergen Reactions   Shellfish Allergy  Hives   Corn-Containing Products     ? 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   Urticaria      Past Surgical History:  Procedure  Laterality Date   BREAST LUMPECTOMY Left 01/29/2017   Procedure: LEFT BREAST LUMPECTOMY SUBAREOLAR DISSECTION ERAS PATHWAY;  Surgeon: Boyce Byes, MD;  Location: [facility];  Service: General;  Laterality: Left;  LMA   BREAST LUMPECTOMY WITH RADIOACTIVE SEED AND SENTINEL LYMPH NODE BIOPSY Left 12/06/2021   Procedure: LEFT BREAST LUMPECTOMY WITH RADIOACTIVE SEED AND SENTINEL LYMPH NODE BIOPSY;  Surgeon: Lockie Rima, MD;  Location: [facility];  Service: General;  Laterality: Left;   BREAST LUMPECTOMY WITH RADIOACTIVE SEED LOCALIZATION Left 09/15/2015   Procedure: LEFT BREAST LUMPECTOMY WITH RADIOACTIVE SEED LOCALIZATION;  Surgeon: Boyce Byes, MD;  Location: [facility];  Service: General;  Laterality: Left;   CESAREAN SECTION     ROBOTIC ASSISTED SALPINGO OOPHERECTOMY Bilateral 07/03/2022   Procedure: XI ROBOTIC ASSISTED SALPINGO OOPHORECTOMY;  Surgeon: Suzi Essex, MD;  Location: [facility];  Service: Gynecology;  Laterality: Bilateral;   WISDOM TOOTH EXTRACTION      Family History  Problem Relation Age of Onset   Hypertension Mother    Breast cancer Mother 54       met d. 21   Hypertension Father    Diabetes Father    CAD Father    Brain cancer Maternal Aunt        dx 80s; d. 78s   Cancer Maternal Uncle        unknown type; ? lung;  dx 60s   Hypertension Maternal Grandmother    Diabetes Maternal Grandmother    Congestive Heart Failure Maternal Grandmother    Diabetes Paternal Grandmother    Diabetes Paternal Grandfather    Brain cancer Cousin        mat female cousin; dx 23s; d. 44s   Asthma Half-Brother    Bladder Cancer Half-Brother        dx 72s; paternal half brother   Breast cancer Other        MGM's sister with breast cancer or other primary    Social History   Tobacco Use   Smoking status: Never    Passive exposure: Never   Smokeless tobacco: Never  Vaping Use   Vaping status: Never Used  Substance Use Topics    Alcohol use: Yes    Comment: occasional   Drug use: No    ROS   Objective:   Vitals: BP (!) (P) 152/85 (BP Location: Right Arm)   Pulse (P) 74   Temp (P) 99.1 F (37.3 C) (Oral)   Resp (P) 16   SpO2 (P) 98%   Physical Exam Constitutional:      General: She is not in acute distress.    Appearance: Normal appearance. She is well-developed. She is not ill-appearing, toxic-appearing or diaphoretic.  HENT:  Head: Normocephalic and atraumatic.     Nose: Nose normal.     Mouth/Throat:     Mouth: Mucous membranes are moist.     Pharynx: No pharyngeal swelling, oropharyngeal exudate, posterior oropharyngeal erythema or uvula swelling.     Tonsils: No tonsillar exudate or tonsillar abscesses. 0 on the right. 0 on the left.     Comments: No facial or oral swelling.  Airway is patent. Eyes:     General: No scleral icterus.       Right eye: No discharge.        Left eye: No discharge.     Extraocular Movements: Extraocular movements intact.  Cardiovascular:     Rate and Rhythm: Normal rate and regular rhythm.     Heart sounds: Normal heart sounds. No murmur heard.    No friction rub. No gallop.  Pulmonary:     Effort: Pulmonary effort is normal. No respiratory distress.     Breath sounds: No stridor. No wheezing, rhonchi or rales.  Chest:     Chest wall: No tenderness.  Skin:    General: Skin is warm and dry.     Findings: Rash (diffuse urticarial patches over the torso spreading toward the neck and left side of her face) present.  Neurological:     General: No focal deficit present.     Mental Status: She is alert and oriented to person, place, and time.  Psychiatric:        Mood and Affect: Mood normal.        Behavior: Behavior normal.    IM Depo-Medrol  80 mg administered in clinic.  Assessment and Plan :   PDMP not reviewed this encounter.  1. Chronic urticaria     No signs of anaphylaxis, airway compromise.  Low suspicion for anaphylaxis.  However will  use steroid as above.  Recommend maintaining all other urticarial management as deemed by her dermatologist.  Follow-up with them ASAP.  Counseled patient on potential for adverse effects with medications prescribed/recommended today, ER and return-to-clinic precautions discussed, patient verbalized understanding.    Adolph Hoop, PA-C 08/31/23 1003

## 2023-08-31 NOTE — ED Triage Notes (Signed)
 Pt states she has been dx with chronic hives/followed by dermatology and is awaiting rx-states increase in scattered hives x 24 with baseline meds-NAD-steady gait

## 2023-09-01 ENCOUNTER — Encounter: Payer: Self-pay | Admitting: Allergy

## 2023-09-01 MED ORDER — MONTELUKAST SODIUM 10 MG PO TABS: 10.0000 mg | ORAL_TABLET | Freq: Every day | ORAL | 2 refills | Status: DC

## 2023-09-02 ENCOUNTER — Encounter: Payer: Self-pay | Admitting: Allergy

## 2023-09-02 ENCOUNTER — Other Ambulatory Visit: Payer: Self-pay

## 2023-09-02 ENCOUNTER — Ambulatory Visit: Admitting: Allergy

## 2023-09-02 VITALS — BP 130/78 | HR 74 | Temp 98.0°F | Resp 18 | Ht 66.0 in | Wt 128.8 lb

## 2023-09-02 DIAGNOSIS — J302 Other seasonal allergic rhinitis: Secondary | ICD-10-CM

## 2023-09-02 DIAGNOSIS — J3089 Other allergic rhinitis: Secondary | ICD-10-CM | POA: Diagnosis not present

## 2023-09-02 DIAGNOSIS — T781XXD Other adverse food reactions, not elsewhere classified, subsequent encounter: Secondary | ICD-10-CM | POA: Diagnosis not present

## 2023-09-02 DIAGNOSIS — L508 Other urticaria: Secondary | ICD-10-CM | POA: Diagnosis not present

## 2023-09-02 MED ORDER — OMALIZUMAB 150 MG/ML ~~LOC~~ SOSY
150.0000 mg | PREFILLED_SYRINGE | Freq: Once | SUBCUTANEOUS | Status: AC
  Administered 2023-09-02: 150 mg via SUBCUTANEOUS

## 2023-09-02 MED ORDER — PREDNISONE 10 MG PO TABS: ORAL_TABLET | ORAL | 0 refills | Status: DC

## 2023-09-02 MED ORDER — HYDROXYZINE HCL 25 MG PO TABS: 25.0000 mg | ORAL_TABLET | Freq: Three times a day (TID) | ORAL | 2 refills | Status: AC | PRN

## 2023-09-02 NOTE — Telephone Encounter (Signed)
 Please call patient and see if she can come in to OR office today at 10AM for OV and Xolair  sample. She has to wait 1 hour after Xolair  injection.   Thank you.  Make sure you give her the OR office address.

## 2023-09-02 NOTE — Patient Instructions (Addendum)
 Reach out to your PCP regarding the diabetes medication as it may be making your hives worse.   Hives Start prednisone  taper. Prednisone  10mg  tablets - take 2 tablets for 4 days then 1 tablet on day 5.  Watch your sugars closely.   Continue allegra 2 tablets twice a day.  Stop zyrtec.  Continue Pepcid  (famotidine ) 20mg  twice a day.  Continue Singulair (montelukast) 10mg  daily at night. May take hydroxyzine 25mg  every 8 hours as needed for breakthrough hives.  Avoid the following potential triggers: alcohol, tight clothing, NSAIDs, hot showers and getting overheated. Start Xolair  300mg  every 4 weeks - sample given.  Consent was signed.   Food Avoid all seafood, peanuts.  Bloodwork was borderline to macadamia, egg, wheat, corn and sesame seed. Monitor symptoms after you eat these foods. You don't have to avoid them if you have no issues.  For mild symptoms you can take over the counter antihistamines (zyrtec 10mg  to 20mg ) and monitor symptoms closely.  If symptoms worsen or if you have severe symptoms including breathing issues, throat closure, significant swelling, whole body hives, severe diarrhea and vomiting, lightheadedness then use epinephrine  and seek immediate medical care afterwards. Emergency action plan in place.   Environmental allergies 2024 bloodwork positive to dust mites, cat, dog, grass, cockroach, trees, ragweed. Continue environmental control measures.  Keep your hematology appointment.   Return in about 2 months (around 11/02/2023).

## 2023-09-02 NOTE — Progress Notes (Signed)
 Immunotherapy   Patient Details  Name: Kari Torres MRN: [id number] Date of Birth: [birth year]  09/02/2023  Seldon Dago started injections for  Xolair  300mg   Frequency: EVERY 4 WEEKS  Epi-Pen:Epi-Pen Available  Consent signed and patient instructions given.  Patient received a sample Xolar injection 300mg  at the [facility] [place] office and waited in office for 60 min. Patient signed consent form for the Xolair  and will make next appointment with Tammy once she has a delivery date for medication.   Jackqulyn Masse 09/02/2023, 11:11 AM

## 2023-09-02 NOTE — Progress Notes (Signed)
 Follow Up Note  RE: Kari Torres MRN: [id number] DOB: Jan 26, 1966 Date of Office Visit: 09/02/2023  Referring provider: Benedetto Brady, MD Primary care provider: Benedetto Brady, MD  Chief Complaint: Urticaria (Got a steroid shot Sunday - has been dealing with flare 1-2 weeks now )  History of Present Illness: I had the pleasure of seeing Kari Torres for a follow up visit at the [facility] on 09/02/2023. She is a 58 y.o. female, who is being followed for chronic urticaria, adverse food reaction, allergic rhinitis. Her previous allergy  office visit was on 08/05/2023 with Dr. [name]. Today is a new complaint visit of hive flare up.  Discussed the use of AI scribe software for clinical note transcription with the patient, who gave verbal consent to proceed.    She has experienced a worsening of her chronic hives, which began last Thursday. Initially, a few hives appeared the Sunday before, but they did not resolve and began spreading by Thursday to her forehead, neck, and back. By Saturday, the hives had spread down her legs, prompting her to visit urgent care on Sunday, where she received a steroid shot. Although there was some initial improvement, the hives returned in full force by Monday morning.  She recently changed her diabetes medication from Farxiga to [place] due to insurance coverage issues. She notes that her hives worsened after the switch, although she was experiencing hives prior to the change.  Her current medication regimen for hives includes taking two Allegra tablets in the morning, two zyrtec at night, Pepcid  twice daily, Singulair at night, and Benadryl as needed for breakthrough hives.   Her blood sugar levels have been fluctuating. No fever or chills. Her breast cancer is in remission. She is scheduled to see a hematologist due to a low white blood cell count.     Assessment and Plan: Kari Torres is a 58 y.o. female with: Chronic urticaria Past history -  Recurrent hives since mid-September, primarily on the torso, lasting a few hours each episode. Noted to worsen with salmon consumption. No other associated symptoms. No recent changes in diet, medications, or personal care products. 2024 labs unremarkable except for positive shellfish and peanuts. Breast cancer dx in 2023. Interim history - exacerbated, possibly due to Jardiance. Xolair  initiated today. Prednisone  considered for flare-up, mindful of blood sugar impact. Hydroxyzine prescribed for breakthrough hives. Reach out to your PCP regarding the diabetes medication as it may be making your hives worse.  Start prednisone  taper. Prednisone  10mg  tablets - take 2 tablets for 4 days then 1 tablet on day 5.  Watch your sugars closely.  Continue allegra 2 tablets twice a day.  Stop zyrtec.  Continue Pepcid  (famotidine ) 20mg  twice a day.  Continue Singulair (montelukast) 10mg  daily at night. May take hydroxyzine 25mg  every 8 hours as needed for breakthrough hives. Avoid the following potential triggers: alcohol, tight clothing, NSAIDs, hot showers and getting overheated. Start Xolair  300mg  every 4 weeks - sample given.  Consent was signed.    Other adverse food reactions, not elsewhere classified, subsequent encounter Past history - 2024 bloodwork positive to peanut , clam, shrimp, scallop, crab, lobster, oyster. Borderline to macadamia, egg, wheat, corn and sesame seed. Negative to chocolate and finned fish. Known allergy  to shellfish. Tolerates finned fish.  Avoid all seafood, peanuts.  Bloodwork was borderline to macadamia, egg, wheat, corn and sesame seed. Monitor symptoms after you eat these foods. You don't have to avoid them if you have no issues.  For mild symptoms you can take over the counter antihistamines (zyrtec 10mg  to 20mg ) and monitor symptoms closely.  If symptoms worsen or if you have severe symptoms including breathing issues, throat closure, significant swelling, whole body hives,  severe diarrhea and vomiting, lightheadedness then use epinephrine  and seek immediate medical care afterwards. Emergency action plan in place.    Seasonal and perennial allergic rhinitis Past history - 2024 bloodwork positive to dust mites, cat, dog, grass, cockroach, trees, ragweed. Continue environmental control measures.  Keep your hematology appointment.   Return in about 2 months (around 11/02/2023).  Meds ordered this encounter  Medications   hydrOXYzine (ATARAX) 25 MG tablet    Sig: Take 1 tablet (25 mg total) by mouth 3 (three) times daily as needed for itching.    Dispense:  30 tablet    Refill:  2   predniSONE  (DELTASONE ) 10 MG tablet    Sig: Start prednisone  taper. Prednisone  10mg  tablets - take 2 tablets for 4 days then 1 tablet on day 5.    Dispense:  9 tablet    Refill:  0   omalizumab  (XOLAIR ) prefilled syringe 150 mg   Lab Orders  No laboratory test(s) ordered today    Diagnostics: None.   Medication List:  Current Outpatient Medications  Medication Sig Dispense Refill   anastrozole  (ARIMIDEX ) 1 MG tablet TAKE 1 TABLET BY MOUTH EVERY DAY 90 tablet 3   Apple Cider Vinegar 500 MG TABS Take 500 mg by mouth daily. 2 gummys, orally once a day.     aspirin (ASPIRIN 81) 81 MG chewable tablet Chew 81 mg by mouth as needed.     atenolol (TENORMIN) 100 MG tablet Take 100 mg by mouth daily.     atorvastatin (LIPITOR) 20 MG tablet Take 20 mg by mouth daily.     Cholecalciferol (VITAMIN D) 50 MCG (2000 UT) CAPS Take 2,000 Units by mouth daily. 2 gummies orally once a day.     Collagen-Vitamin C (COLLAGEN PLUS VITAMIN C PO) Take by mouth.     conjugated estrogens  (PREMARIN ) vaginal cream Place 1 Applicatorful vaginally 3 (three) times a week. Throw out applicator. Place small amount of cream on your finger tip and insert into the vagina 3 times a week at night. 42.5 g 1   empagliflozin (JARDIANCE) 10 MG TABS tablet      EPINEPHrine  0.3 mg/0.3 mL IJ SOAJ injection Inject 0.3  mg into the muscle as needed for anaphylaxis. 2 each 1   famotidine  (PEPCID ) 20 MG tablet TAKE 1 TABLET BY MOUTH TWICE A DAY 180 tablet 0   fexofenadine (ALLEGRA ALLERGY ) 180 MG tablet 1 tablet Swallow whole with water ; do not take with fruit juices. Orally Once a day     hydrochlorothiazide (HYDRODIURIL) 12.5 MG tablet Take 1 tablet (12.5 mg total) by mouth daily. 30 tablet 0   hydrochlorothiazide (HYDRODIURIL) 25 MG tablet Take 25 mg by mouth daily.     hydrOXYzine (ATARAX) 25 MG tablet Take 1 tablet (25 mg total) by mouth 3 (three) times daily as needed for itching. 30 tablet 2   LUTEIN PO Take 20 mg by mouth daily.     metFORMIN (GLUCOPHAGE) 1000 MG tablet Take 1,000 mg by mouth 2 (two) times daily.     montelukast (SINGULAIR) 10 MG tablet Take 1 tablet (10 mg total) by mouth at bedtime. 30 tablet 2   Multiple Vitamins-Minerals (CENTRUM ADULTS PO) Take 1 tablet by mouth daily.     omalizumab  (XOLAIR ) 300  MG/2  ML prefilled syringe Inject 300 mg into the skin every 28 (twenty-eight) days. 2 mL 11   predniSONE  (DELTASONE ) 10 MG tablet Start prednisone  taper. Prednisone  10mg  tablets - take 2 tablets for 4 days then 1 tablet on day 5. 9 tablet 0   Probiotic Product (PROBIOTIC DAILY PO) Take 1 capsule by mouth daily.     TIADYLT ER 240 MG 24 hr capsule Take 240 mg by mouth daily.     Vaginal Lubricant (REVAREE) SUPP Place 1 suppository vaginally 2 (two) times a week.     No current facility-administered medications for this visit.   Allergies: Allergies  Allergen Reactions   Shellfish Allergy  Hives   Corn-Containing Products     ? hives   Peanut  (Diagnostic) Hives and Itching   I reviewed her past medical history, social history, family history, and environmental history and no significant changes have been reported from her previous visit.  Review of Systems  Constitutional:  Negative for appetite change, chills, fever and unexpected weight change.  HENT:  Negative for congestion and  rhinorrhea.   Eyes:  Negative for itching.  Respiratory:  Negative for cough, chest tightness, shortness of breath and wheezing.   Cardiovascular:  Negative for chest pain.  Gastrointestinal:  Negative for abdominal pain.  Genitourinary:  Negative for difficulty urinating.  Skin:  Positive for rash.  Allergic/Immunologic: Positive for environmental allergies and food allergies.  Neurological:  Negative for headaches.    Objective: BP 130/78 (BP Location: Right Arm, Patient Position: Sitting, Cuff Size: Normal)   Pulse 74   Temp 98 F (36.7 C) (Temporal)   Resp 18   Ht 5\' 6"  (1.676 m)   Wt 128 lb 12.8 oz (58.4 kg)   SpO2 99%   BMI 20.79 kg/m  Body mass index is 20.79 kg/m. Physical Exam Vitals and nursing note reviewed.  Constitutional:      Appearance: Normal appearance. She is well-developed.  HENT:     Head: Normocephalic and atraumatic.     Right Ear: Tympanic membrane and external ear normal.     Left Ear: Tympanic membrane and external ear normal.     Nose: Nose normal.     Mouth/Throat:     Mouth: Mucous membranes are moist.     Pharynx: Oropharynx is clear.  Eyes:     Conjunctiva/sclera: Conjunctivae normal.  Cardiovascular:     Rate and Rhythm: Normal rate and regular rhythm.     Heart sounds: Normal heart sounds. No murmur heard.    No friction rub. No gallop.  Pulmonary:     Effort: Pulmonary effort is normal.     Breath sounds: Normal breath sounds. No wheezing, rhonchi or rales.  Musculoskeletal:     Cervical back: Neck supple.  Skin:    General: Skin is warm.     Findings: Rash present.     Comments: Diffuse hives on neck, forehead, torso, upper legs b/l.  Neurological:     Mental Status: She is alert and oriented to person, place, and time.  Psychiatric:        Behavior: Behavior normal.    Previous notes and tests were reviewed. The plan was reviewed with the patient/family, and all questions/concerned were addressed.  It was my pleasure to  see Kari Torres today and participate in her care. Please feel free to contact me with any questions or concerns.  Sincerely,  Eudelia Hero, DO Allergy  & Immunology  [facility]  [place] office:  [phone] [facility] office: [phone]

## 2023-09-03 ENCOUNTER — Telehealth: Payer: Self-pay

## 2023-09-03 NOTE — Telephone Encounter (Signed)
 Pharmacy called to set up shipment for xolair  injection. Patient received a sample on 09/02/23 at the [facility] office and is due for her next injection on 7/8/5. Medication will be shipped to the [place] office on 09/17/23 due to the patient wanting her shots to be at the [facility] office. I called the patient to schedule an appointment and left a message for her to call the [place] office back.

## 2023-09-05 NOTE — Telephone Encounter (Signed)
 Patient has a scheduled for 09/30/23 at the [facility] office for her next injection.

## 2023-09-30 ENCOUNTER — Ambulatory Visit

## 2023-09-30 DIAGNOSIS — L501 Idiopathic urticaria: Secondary | ICD-10-CM | POA: Diagnosis not present

## 2023-09-30 MED ORDER — OMALIZUMAB 300 MG/2  ML ~~LOC~~ SOSY
300.0000 mg | PREFILLED_SYRINGE | Freq: Once | SUBCUTANEOUS | Status: AC
  Administered 2023-09-30: 300 mg via SUBCUTANEOUS

## 2023-10-06 ENCOUNTER — Ambulatory Visit: Attending: General Surgery

## 2023-10-06 VITALS — Wt 130.4 lb

## 2023-10-06 DIAGNOSIS — Z483 Aftercare following surgery for neoplasm: Secondary | ICD-10-CM | POA: Insufficient documentation

## 2023-10-06 NOTE — Therapy (Signed)
 OUTPATIENT PHYSICAL THERAPY SOZO SCREENING NOTE   Patient Name: Kari Torres MRN: [id number] DOB:09-23-1965, 58 y.o., female Today's Date: 10/06/2023  PCP: Leonel Cole, MD REFERRING PROVIDER: Aron Shoulders, MD   PT End of Session - 10/06/23 1618     Visit Number 7   # unchanged due to screen only   PT Start Time 1616    PT Stop Time 1620    PT Time Calculation (min) 4 min    Activity Tolerance Patient tolerated treatment well    Behavior During Therapy [facility] for tasks assessed/performed          Past Medical History:  Diagnosis Date   Allergic rhinitis due to pollen    Bloody discharge from left nipple 01/29/2017   Breast cancer (HCC)    Diabetes mellitus without complication (HCC)    Family history of breast cancer 11/21/2021   Heart murmur    Hypercholesterolemia    Hypertension    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   Urticaria    Past Surgical History:  Procedure Laterality Date   BREAST LUMPECTOMY Left 01/29/2017   Procedure: LEFT BREAST LUMPECTOMY SUBAREOLAR DISSECTION ERAS PATHWAY;  Surgeon: Gail Favorite, MD;  Location: [facility];  Service: General;  Laterality: Left;  LMA   BREAST LUMPECTOMY WITH RADIOACTIVE SEED AND SENTINEL LYMPH NODE BIOPSY Left 12/06/2021   Procedure: LEFT BREAST LUMPECTOMY WITH RADIOACTIVE SEED AND SENTINEL LYMPH NODE BIOPSY;  Surgeon: Aron Shoulders, MD;  Location: [facility];  Service: General;  Laterality: Left;   BREAST LUMPECTOMY WITH RADIOACTIVE SEED LOCALIZATION Left 09/15/2015   Procedure: LEFT BREAST LUMPECTOMY WITH RADIOACTIVE SEED LOCALIZATION;  Surgeon: Favorite Gail, MD;  Location:  SURGERY CENTER;  Service: General;  Laterality: Left;   CESAREAN SECTION     ROBOTIC ASSISTED SALPINGO OOPHERECTOMY Bilateral 07/03/2022   Procedure: XI ROBOTIC ASSISTED SALPINGO OOPHORECTOMY;  Surgeon: Viktoria Comer SAUNDERS, MD;  Location: [facility];  Service: Gynecology;  Laterality: Bilateral;   WISDOM  TOOTH EXTRACTION     Patient Active Problem List   Diagnosis Date Noted   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   Genetic testing 11/30/2021   Family history of breast cancer 11/21/2021   Family history of brain cancer 11/21/2021   Family history of bladder cancer 11/21/2021   Malignant neoplasm of upper-outer quadrant of left breast in female, estrogen receptor positive (HCC) 11/19/2021   Hypertensive disorder 07/12/2021   Diabetes mellitus (HCC) 07/12/2021   Bloody discharge from left nipple 01/29/2017   Uncontrolled 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 It measures 1.6 cm and is located in the upper outer quadrant. It is ER/PR positive and HER2 negative with a Ki67 of 5%. She is s/p left lumpectomy with SLNB on 12/06/2021 with 0/3 LN. She  does not require chemo, but will have radiation.She also has diabetes and hypertension I ddi well with my ROM initially, and lost some when the hematoma happened. I have been doing the exercises.   PRECAUTIONS: left UE Lymphedema risk, None  SUBJECTIVE: Pt returns for her last 3 month L-Dex screen.   PAIN:  Are you having pain? No  SOZO SCREENING: Patient was assessed today using the SOZO machine to determine the lymphedema index score. This was compared to her baseline score. It was determined that she is within the recommended range when compared to her baseline and no further action is needed at this time. She will continue SOZO screenings. These are done every 3 months for 2 years post operatively followed by every 6 months for 2 years, and  then annually.   L-DEX FLOWSHEETS - 10/06/23 1600       L-DEX LYMPHEDEMA SCREENING   Measurement Type Unilateral    L-DEX MEASUREMENT EXTREMITY Upper Extremity    POSITION  Standing    DOMINANT SIDE Right    At Risk Side Left    BASELINE SCORE (UNILATERAL) 0.6    L-DEX SCORE (UNILATERAL) -1.8    VALUE CHANGE (UNILAT) -2.4         P: Begin 6 month screens.    Aden Berwyn Caldron, PTA 10/06/2023, 4:19 PM

## 2023-10-13 ENCOUNTER — Inpatient Hospital Stay

## 2023-10-13 ENCOUNTER — Inpatient Hospital Stay
Payer: No Typology Code available for payment source | Attending: Hematology and Oncology | Admitting: Hematology and Oncology

## 2023-10-13 VITALS — BP 146/80 | HR 66 | Temp 97.6°F | Resp 18 | Wt 128.2 lb

## 2023-10-13 DIAGNOSIS — Z825 Family history of asthma and other chronic lower respiratory diseases: Secondary | ICD-10-CM | POA: Diagnosis not present

## 2023-10-13 DIAGNOSIS — L509 Urticaria, unspecified: Secondary | ICD-10-CM | POA: Diagnosis not present

## 2023-10-13 DIAGNOSIS — Z809 Family history of malignant neoplasm, unspecified: Secondary | ICD-10-CM | POA: Diagnosis not present

## 2023-10-13 DIAGNOSIS — Z1721 Progesterone receptor positive status: Secondary | ICD-10-CM | POA: Diagnosis not present

## 2023-10-13 DIAGNOSIS — Z803 Family history of malignant neoplasm of breast: Secondary | ICD-10-CM | POA: Insufficient documentation

## 2023-10-13 DIAGNOSIS — Z1509 Genetic susceptibility to other malignant neoplasm: Secondary | ICD-10-CM

## 2023-10-13 DIAGNOSIS — E119 Type 2 diabetes mellitus without complications: Secondary | ICD-10-CM | POA: Diagnosis not present

## 2023-10-13 DIAGNOSIS — Z833 Family history of diabetes mellitus: Secondary | ICD-10-CM | POA: Diagnosis not present

## 2023-10-13 DIAGNOSIS — Z808 Family history of malignant neoplasm of other organs or systems: Secondary | ICD-10-CM | POA: Insufficient documentation

## 2023-10-13 DIAGNOSIS — Z170 Estrogen receptor positive status [ER+]: Secondary | ICD-10-CM | POA: Insufficient documentation

## 2023-10-13 DIAGNOSIS — R0982 Postnasal drip: Secondary | ICD-10-CM | POA: Diagnosis not present

## 2023-10-13 DIAGNOSIS — Z1732 Human epidermal growth factor receptor 2 negative status: Secondary | ICD-10-CM | POA: Diagnosis not present

## 2023-10-13 DIAGNOSIS — Z1501 Genetic susceptibility to malignant neoplasm of breast: Secondary | ICD-10-CM

## 2023-10-13 DIAGNOSIS — Z8249 Family history of ischemic heart disease and other diseases of the circulatory system: Secondary | ICD-10-CM | POA: Insufficient documentation

## 2023-10-13 DIAGNOSIS — Z90721 Acquired absence of ovaries, unilateral: Secondary | ICD-10-CM | POA: Insufficient documentation

## 2023-10-13 DIAGNOSIS — R634 Abnormal weight loss: Secondary | ICD-10-CM | POA: Diagnosis not present

## 2023-10-13 DIAGNOSIS — M2560 Stiffness of unspecified joint, not elsewhere classified: Secondary | ICD-10-CM | POA: Diagnosis not present

## 2023-10-13 DIAGNOSIS — D709 Neutropenia, unspecified: Secondary | ICD-10-CM

## 2023-10-13 DIAGNOSIS — Z79811 Long term (current) use of aromatase inhibitors: Secondary | ICD-10-CM | POA: Insufficient documentation

## 2023-10-13 DIAGNOSIS — Z8052 Family history of malignant neoplasm of bladder: Secondary | ICD-10-CM | POA: Diagnosis not present

## 2023-10-13 DIAGNOSIS — C50412 Malignant neoplasm of upper-outer quadrant of left female breast: Secondary | ICD-10-CM | POA: Diagnosis present

## 2023-10-13 DIAGNOSIS — Z79899 Other long term (current) drug therapy: Secondary | ICD-10-CM | POA: Diagnosis not present

## 2023-10-13 DIAGNOSIS — Z1589 Genetic susceptibility to other disease: Secondary | ICD-10-CM

## 2023-10-13 LAB — CBC WITH DIFFERENTIAL/PLATELET
Abs Immature Granulocytes: 0.01 K/uL (ref 0.00–0.07)
Basophils Absolute: 0.1 K/uL (ref 0.0–0.1)
Basophils Relative: 2 %
Eosinophils Absolute: 0.1 K/uL (ref 0.0–0.5)
Eosinophils Relative: 4 %
HCT: 40.8 % (ref 36.0–46.0)
Hemoglobin: 13.6 g/dL (ref 12.0–15.0)
Immature Granulocytes: 0 %
Lymphocytes Relative: 32 %
Lymphs Abs: 1.3 K/uL (ref 0.7–4.0)
MCH: 29.9 pg (ref 26.0–34.0)
MCHC: 33.3 g/dL (ref 30.0–36.0)
MCV: 89.7 fL (ref 80.0–100.0)
Monocytes Absolute: 0.4 K/uL (ref 0.1–1.0)
Monocytes Relative: 9 %
Neutro Abs: 2.2 K/uL (ref 1.7–7.7)
Neutrophils Relative %: 53 %
Platelets: 334 K/uL (ref 150–400)
RBC: 4.55 MIL/uL (ref 3.87–5.11)
RDW: 13.1 % (ref 11.5–15.5)
WBC: 4.1 K/uL (ref 4.0–10.5)
nRBC: 0 % (ref 0.0–0.2)

## 2023-10-13 LAB — CMP (CANCER CENTER ONLY)
ALT: 18 U/L (ref 0–44)
AST: 19 U/L (ref 15–41)
Albumin: 4.9 g/dL (ref 3.5–5.0)
Alkaline Phosphatase: 66 U/L (ref 38–126)
Anion gap: 8 (ref 5–15)
BUN: 19 mg/dL (ref 6–20)
CO2: 32 mmol/L (ref 22–32)
Calcium: 10.5 mg/dL — ABNORMAL HIGH (ref 8.9–10.3)
Chloride: 97 mmol/L — ABNORMAL LOW (ref 98–111)
Creatinine: 0.87 mg/dL (ref 0.44–1.00)
GFR, Estimated: >60 mL/min (ref >60)
Glucose, Bld: 135 mg/dL — ABNORMAL HIGH (ref 70–99)
Potassium: 3.7 mmol/L (ref 3.5–5.1)
Sodium: 137 mmol/L (ref 135–145)
Total Bilirubin: 0.5 mg/dL (ref 0.0–1.2)
Total Protein: 8.3 g/dL — ABNORMAL HIGH (ref 6.5–8.1)

## 2023-10-13 LAB — IRON AND IRON BINDING CAPACITY (CC-WL,HP ONLY)
Iron: 70 ug/dL (ref 28–170)
Saturation Ratios: 16 % (ref 10.4–31.8)
TIBC: 441 ug/dL (ref 250–450)
UIBC: 371 ug/dL (ref 148–442)

## 2023-10-13 LAB — TSH: TSH: 2.18 u[IU]/mL (ref 0.350–4.500)

## 2023-10-13 LAB — FERRITIN: Ferritin: 78 ng/mL (ref 11–307)

## 2023-10-13 NOTE — Progress Notes (Signed)
 BRIEF ONCOLOGIC HISTORY:  Oncology History  Malignant neoplasm of upper-outer quadrant of left breast in female, estrogen receptor positive (HCC)  11/06/2021 Mammogram   Bilateral screening mammogram shows left breast asymmetry which is indeterminate.  Additional views with possible ultrasound are recommended.  Left breast unilateral diagnostic mammogram confirmed a 1.6 x 1.1 cm upper outer aspect middle depth asymmetry with no other significant masses or calcifications.   11/12/2021 Pathology Results   Left breast needle core biopsy at 2:00 shows invasive ductal carcinoma, grade 2 prognostics ER 100% positive strong staining PR 50% positive moderate to strong staining Ki-67 of 5% and group 4 HER2 negative   11/19/2021 Initial Diagnosis   Malignant neoplasm of upper-outer quadrant of left breast in female, estrogen receptor positive (HCC)   11/20/2021 Cancer Staging   Staging form: Breast, AJCC 8th Edition - Clinical stage from 11/20/2021: Stage IA (cT1c, cN0, cM0, G2, ER+, PR+, HER2-) - Signed by Lanell Donald Stagger, PA-C on 11/20/2021 Method of lymph node assessment: Clinical Histologic grading system: 3 grade system   11/30/2021 Genetic Testing   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  [facility] BRCAPlus report date is 11/30/2021.  [facility] CancerNext-Expanded +RNAinsight report date is 12/06/2021.   The CancerNext-Expanded gene panel offered by [facility] and includes sequencing, rearrangement, and RNA analysis for the following 77 genes: AIP, ALK, APC, ATM, AXIN2, BAP1, BARD1, BLM, BMPR1A, BRCA1, BRCA2, BRIP1, CDC73, CDH1, CDK4, CDKN1B, CDKN2A, CHEK2, CTNNA1, DICER1, FANCC, FH, FLCN, GALNT12, KIF1B, LZTR1, MAX, MEN1, MET, MLH1, MSH2, MSH3, MSH6, MUTYH, NBN, NF1, NF2, NTHL1, PALB2, PHOX2B, PMS2, POT1, PRKAR1A, PTCH1, PTEN, RAD51C, RAD51D, RB1, RECQL, RET, SDHA, SDHAF2, SDHB, SDHC, SDHD, SMAD4, SMARCA4, SMARCB1, SMARCE1, STK11, SUFU, TMEM127, TP53, TSC1, TSC2, VHL and XRCC2  (sequencing and deletion/duplication); EGFR, EGLN1, HOXB13, KIT, MITF, PDGFRA, POLD1, and POLE (sequencing only); EPCAM and GREM1 (deletion/duplication only).    12/06/2021 Surgery   Left breast lumpectomy: IDC, 1.5cm, g2, margins negative, 3 SLN negative, T1cN0   12/06/2021 Oncotype testing   25/12%   02/05/2022 - 03/06/2022 Radiation Therapy   Site Technique Total Dose (Gy) Dose per Fx (Gy) Completed Fx Beam Energies  Breast, Left: Breast_L 3D 42.56/42.56 2.66 16/16 6XFFF  Breast, Left: Breast_L_Bst 3D 8/8 2 4/4 6X, 10X     03/2022 -  Anti-estrogen oral therapy   Anastrozole    07/03/2022 Surgery   BSO: benign     INTERVAL HISTORY:  History of Present Illness    Discussed the use of AI scribe software for clinical note transcription with the patient, who gave verbal consent to proceed.  History of Present Illness Kari Torres is a 58 year old female with chronic hives and a history of breast cancer who presents for evaluation of low white blood cell count. She was referred by her primary care physician for evaluation of low white blood cell count.  She has chronic hives and is currently receiving Xolair  injections every four weeks. Her second injection was two weeks ago, and she has been hive-free since June 18th. Despite this, she continues to take a significant amount of antihistamines, including two Allegra (180 mg) twice daily and Singulair . She previously experienced a severe episode requiring a steroid pack before starting Xolair .  She is undergoing regular breast cancer surveillance with alternating mammograms and MRIs. Her last MRI in February was normal, and her next mammogram is scheduled for the end of August. She continues to take anastrozole  and has noticed some side effects, including joint stiffness in  her extremities, occasional cramping in her feet, and a constant runny nose and postnasal drip. These symptoms began after starting anastrozole .  She reports an  unintentional weight loss of about seven pounds over the last six months, which she attributes to increased physical activity and a change in her diabetes medication from Farxiga to [place], and then back to a higher dose of Farxiga. She is also on a regimen of antihistamines and has undergone a steroid course, which she believes may have contributed to the weight loss.  She speculates that the antihistamines, including Allegra, Zyrtec, and famotidine , might be affecting her white blood cell count.   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   Urticaria    Past Surgical History:  Procedure Laterality Date   BREAST LUMPECTOMY Left 01/29/2017   Procedure: LEFT BREAST LUMPECTOMY SUBAREOLAR DISSECTION ERAS PATHWAY;  Surgeon: Gail Favorite, MD;  Location: [facility];  Service: General;  Laterality: Left;  LMA   BREAST LUMPECTOMY WITH RADIOACTIVE SEED AND SENTINEL LYMPH NODE BIOPSY Left 12/06/2021   Procedure: LEFT BREAST LUMPECTOMY WITH RADIOACTIVE SEED AND SENTINEL LYMPH NODE BIOPSY;  Surgeon: Aron Shoulders, MD;  Location: [facility];  Service: General;  Laterality: Left;   BREAST LUMPECTOMY WITH RADIOACTIVE SEED LOCALIZATION Left 09/15/2015   Procedure: LEFT BREAST LUMPECTOMY WITH RADIOACTIVE SEED LOCALIZATION;  Surgeon: Favorite Gail, MD;  Location: [facility];  Service: General;  Laterality: Left;   CESAREAN SECTION     ROBOTIC ASSISTED SALPINGO OOPHERECTOMY Bilateral 07/03/2022   Procedure: XI ROBOTIC ASSISTED SALPINGO OOPHORECTOMY;  Surgeon: Viktoria Comer SAUNDERS, MD;  Location: [facility];  Service: Gynecology;   Laterality: Bilateral;   WISDOM TOOTH EXTRACTION       ALLERGIES:  Allergies  Allergen Reactions   Shellfish Allergy  Hives   Corn-Containing Products     ? hives   Peanut  (Diagnostic) Hives and Itching     CURRENT MEDICATIONS:  Outpatient Encounter Medications as of 10/13/2023  Medication Sig Note   anastrozole  (ARIMIDEX ) 1 MG tablet TAKE 1 TABLET BY MOUTH EVERY DAY    Apple Cider Vinegar 500 MG TABS Take 500 mg by mouth daily. 2 gummys, orally once a day.    aspirin (ASPIRIN 81) 81 MG chewable tablet Chew 81 mg by mouth as needed.    atenolol (TENORMIN) 100 MG tablet Take 100 mg by mouth daily.    atorvastatin (LIPITOR) 20 MG tablet Take 20 mg by mouth daily.    Cholecalciferol (VITAMIN D) 50 MCG (2000 UT) CAPS Take 2,000 Units by mouth daily. 2 gummies orally once a day.    Collagen-Vitamin C (COLLAGEN PLUS VITAMIN C PO) Take by mouth.    conjugated estrogens  (PREMARIN ) vaginal cream Place 1 Applicatorful vaginally 3 (three) times a week. Throw out applicator. Place small amount of cream on your finger tip and insert into the vagina 3 times a week at night.    empagliflozin (JARDIANCE) 10 MG TABS tablet     EPINEPHrine  0.3 mg/0.3 mL IJ SOAJ injection Inject 0.3 mg into the muscle as needed for anaphylaxis.    famotidine  (PEPCID ) 20 MG tablet TAKE 1  TABLET BY MOUTH TWICE A DAY    fexofenadine (ALLEGRA ALLERGY ) 180 MG tablet 1 tablet Swallow whole with water ; do not take with fruit juices. Orally Once a day    hydrochlorothiazide  (HYDRODIURIL ) 12.5 MG tablet Take 1 tablet (12.5 mg total) by mouth daily.    hydrochlorothiazide  (HYDRODIURIL ) 25 MG tablet Take 25 mg by mouth daily.    hydrOXYzine  (ATARAX ) 25 MG tablet Take 1 tablet (25 mg total) by mouth 3 (three) times daily as needed for itching.    LUTEIN PO Take 20 mg by mouth daily.    metFORMIN (GLUCOPHAGE) 1000 MG tablet Take 1,000 mg by mouth 2 (two) times daily.    montelukast  (SINGULAIR ) 10 MG tablet Take 1 tablet (10 mg  total) by mouth at bedtime.    Multiple Vitamins-Minerals (CENTRUM ADULTS PO) Take 1 tablet by mouth daily.    omalizumab  (XOLAIR ) 300 MG/2  ML prefilled syringe Inject 300 mg into the skin every 28 (twenty-eight) days.    predniSONE  (DELTASONE ) 10 MG tablet Start prednisone  taper. Prednisone  10mg  tablets - take 2 tablets for 4 days then 1 tablet on day 5.    Probiotic Product (PROBIOTIC DAILY PO) Take 1 capsule by mouth daily.    TIADYLT ER 240 MG 24 hr capsule Take 240 mg by mouth daily. 06/17/2022: Diltiazem    Vaginal Lubricant (REVAREE) SUPP Place 1 suppository vaginally 2 (two) times a week.    No facility-administered encounter medications on file as of 10/13/2023.     ONCOLOGIC FAMILY HISTORY:  Family History  Problem Relation Age of Onset   Hypertension Mother    Breast cancer Mother 58       met d. 45   Hypertension Father    Diabetes Father    CAD Father    Brain cancer Maternal Aunt        dx 28s; d. 61s   Cancer Maternal Uncle        unknown type; ? lung;  dx 60s   Hypertension Maternal Grandmother    Diabetes Maternal Grandmother    Congestive Heart Failure Maternal Grandmother    Diabetes Paternal Grandmother    Diabetes Paternal Grandfather    Brain cancer Cousin        mat female cousin; dx 68s; d. 2s   Asthma Half-Brother    Bladder Cancer Half-Brother        dx 80s; paternal half brother   Breast cancer Other        MGM's sister with breast cancer or other primary     SOCIAL HISTORY:  Social History   Socioeconomic History   Marital status: Divorced    Spouse name: Not on file   Number of children: Not on file   Years of education: Not on file   Highest education level: Not on file  Occupational History   Occupation: works as an Airline pilot  Tobacco Use   Smoking status: Never    Passive exposure: Never   Smokeless tobacco: Never  Vaping Use   Vaping status: Never Used  Substance and Sexual Activity   Alcohol use: Yes    Comment: occasional    Drug use: No   Sexual activity: Not Currently  Other Topics Concern   Not on file  Social History Narrative   Not on file   Social Drivers of Health   Financial Resource Strain: Low Risk  (11/21/2021)   Overall Financial Resource Strain (CARDIA)    Difficulty of Paying Living Expenses: Not very  hard  Food Insecurity: No Food Insecurity (11/21/2021)   Hunger Vital Sign    Worried About Running Out of Food in the Last Year: Never true    Ran Out of Food in the Last Year: Never true  Transportation Needs: No Transportation Needs (11/21/2021)   PRAPARE - Administrator, Civil Service (Medical): No    Lack of Transportation (Non-Medical): No  Physical Activity: Not on file  Stress: Not on file  Social Connections: Not on file  Intimate Partner Violence: Not on file     OBSERVATIONS/OBJECTIVE:  BP (!) 146/80 (BP Location: Left Arm, Patient Position: Sitting)   Pulse 66   Temp 97.6 F (36.4 C) (Temporal)   Resp 18   Wt 128 lb 4 oz (58.2 kg)   SpO2 100%   BMI 20.70 kg/m   GENERAL: Patient is a well appearing female in no acute distress HEENT:  Sclerae anicteric.  Oropharynx clear and moist. No ulcerations or evidence of oropharyngeal candidiasis. Neck is supple.  NODES:  No cervical, supraclavicular, or axillary lymphadenopathy palpated.  BREAST EXAM: Left breast status postlumpectomy and radiation no sign of local recurrence right breast is benign. LUNGS:  Clear to auscultation bilaterally.  No wheezes or rhonchi. HEART:  Regular rate and rhythm. No murmur appreciated. ABDOMEN:  Soft, nontender.  Positive, normoactive bowel sounds. No organomegaly palpated. MSK:  No focal spinal tenderness to palpation. Full range of motion bilaterally in the upper extremities. EXTREMITIES:  No peripheral edema.   SKIN:  Hives all over,  NEURO:  Nonfocal. Well oriented.  Appropriate affect.   LABORATORY DATA:  None for this visit.  DIAGNOSTIC IMAGING:  None for this visit.       ASSESSMENT AND PLAN:  Ms.. Mccrackin is a pleasant 58 y.o. female with Stage IA left breast invasive ductal carcinoma, ER+/PR+/HER2-, diagnosed in 10/2021, treated with lumpectomy, adjuvant radiation therapy, and anti-estrogen therapy with Anastrozole  beginning in 03/2022.  Assessment & Plan Breast cancer and PALB2 mutation Currently on anastrozole  with joint stiffness and cramping in extremities, possibly related to anastrozole . - Continue anastrozole . - Mammogram scheduled for end of August. - Alternate mammogram and MRI, no concern for recurrence.  Chronic urticaria Managed with Xolair  injections every four weeks. Hive-free since June 18th. Plan to wean off antihistamines as Xolair  becomes effective. - Continue Xolair  injections every four weeks. - Continue current antihistamine regimen.  Neutropenia Slightly low white blood cell count and neutrophils noted in May. Possible causes include medications such as antihistamines, Xolair , and Farxiga.  - Order lab tests including iron, ferritin, ANA, and thyroid  function. - Perform smear review. - Consider flow cytometry if needed.  RTC as recommended in 3/4 weeks. Time spent: 30 min  *Total Encounter Time as defined by the Centers for Medicare and Medicaid Services includes, in addition to the face-to-face time of a patient visit (documented in the note above) non-face-to-face time: obtaining and reviewing outside history, ordering and reviewing medications, tests or procedures, care coordination (communications with other health care professionals or caregivers) and documentation in the medical record.

## 2023-10-14 LAB — FOLATE RBC
Folate, Hemolysate: 550 ng/mL
Folate, RBC: 1270 ng/mL (ref >498)
Hematocrit: 43.3 % (ref 34.0–46.6)

## 2023-10-14 LAB — SURGICAL PATHOLOGY

## 2023-10-14 LAB — ANTINUCLEAR ANTIBODIES, IFA: ANA Ab, IFA: NEGATIVE

## 2023-10-15 LAB — FLOW CYTOMETRY

## 2023-10-20 ENCOUNTER — Telehealth: Payer: Self-pay | Admitting: *Deleted

## 2023-10-20 NOTE — Telephone Encounter (Signed)
 This RN attempted to return call to pt per her VM stating concern for RLQ pain occurring.   I just saw Dr Loretha last week and not sure if appropriate to ask  She states she went to a walk in clinic and was evaluated with recommendation to proceed to the ER for further work up and probable need for a CT.   I do not know if this is something Dr Loretha could order for me vs just sitting in the ER waiting room  This RN obtained identified VM- message left informing above symptoms do not seem to relate to her early stage breast cancer or therapy.  Informed her best to proceed to the ER due to more urgent concerns.

## 2023-10-28 ENCOUNTER — Ambulatory Visit

## 2023-10-28 DIAGNOSIS — L501 Idiopathic urticaria: Secondary | ICD-10-CM

## 2023-10-28 MED ORDER — OMALIZUMAB 300 MG/2  ML ~~LOC~~ SOSY
300.0000 mg | PREFILLED_SYRINGE | SUBCUTANEOUS | Status: AC
  Administered 2023-10-28 – 2024-03-29 (×6): 300 mg via SUBCUTANEOUS

## 2023-11-10 ENCOUNTER — Other Ambulatory Visit: Payer: Self-pay

## 2023-11-10 ENCOUNTER — Encounter: Payer: Self-pay | Admitting: Allergy

## 2023-11-10 ENCOUNTER — Ambulatory Visit (INDEPENDENT_AMBULATORY_CARE_PROVIDER_SITE_OTHER): Admitting: Allergy

## 2023-11-10 VITALS — BP 116/90 | HR 68 | Temp 98.6°F | Resp 16 | Ht 66.5 in | Wt 129.7 lb

## 2023-11-10 DIAGNOSIS — J302 Other seasonal allergic rhinitis: Secondary | ICD-10-CM | POA: Diagnosis not present

## 2023-11-10 DIAGNOSIS — J3089 Other allergic rhinitis: Secondary | ICD-10-CM

## 2023-11-10 DIAGNOSIS — T781XXD Other adverse food reactions, not elsewhere classified, subsequent encounter: Secondary | ICD-10-CM

## 2023-11-10 DIAGNOSIS — L508 Other urticaria: Secondary | ICD-10-CM

## 2023-11-10 MED ORDER — MONTELUKAST SODIUM 10 MG PO TABS: 10.0000 mg | ORAL_TABLET | Freq: Every day | ORAL | 2 refills | Status: AC

## 2023-11-10 MED ORDER — FAMOTIDINE 20 MG PO TABS: 20.0000 mg | ORAL_TABLET | Freq: Two times a day (BID) | ORAL | 1 refills | Status: AC

## 2023-11-10 NOTE — Patient Instructions (Addendum)
 Hives Continue allegra 2 tablets twice a day.  Continue Pepcid  (famotidine ) 20mg  twice a day.  Continue Singulair  (montelukast ) 10mg  daily at night. May take hydroxyzine  25mg  every 8 hours as needed for breakthrough hives. Avoid the following potential triggers: alcohol, tight clothing, NSAIDs, hot showers and getting overheated. Continue Xolair  300mg  every 4 weeks.  Step down schedule:  Decrease Allegra to 180mg  in the morning and 360mg  in the evening. Continue Pepcid  20mg  twice a day and Singulair  10mg  daily. If no hives/itching for 2 weeks then: Decrease Allegra to 180mg  twice a day. Continue Pepcid  20mg  twice a day and Singulair  10mg  daily. If no hives/itching for 2 weeks then: Stop Singulair . Continue Allegra 180mg  twice a day. Continue Pepcid  20mg  twice a day. If no hives/itching for 2 weeks then: Decrease Pepcid  to 20mg  once a day. Continue Allegra 180mg  twice a day. If no hives/itching for 2 weeks then: Decrease Allegra to 180mg  once a day. Continue Pepcid  20mg  once a day. If no hives/itching for 2 weeks then: Stop Pepcid . Continue allegra 180mg  once a day. If no symptoms for 2 weeks then: Stop allegra.  If you get symptoms then go back to the dose where you didn't have any symptoms.   Food Avoid all seafood, peanuts.  For mild symptoms you can take over the counter antihistamines (zyrtec 10mg  to 20mg ) and monitor symptoms closely.  If symptoms worsen or if you have severe symptoms including breathing issues, throat closure, significant swelling, whole body hives, severe diarrhea and vomiting, lightheadedness then use epinephrine  and seek immediate medical care afterwards. Emergency action plan in place.   Environmental allergies 2024 bloodwork positive to dust mites, cat, dog, grass, cockroach, trees, ragweed. Continue environmental control measures.  Return in about 6 months (around 05/12/2024).

## 2023-11-10 NOTE — Progress Notes (Signed)
 Follow Up Note  RE: Kari Torres MRN: [id number] DOB: 04/13/1965 Date of Office Visit: 11/10/2023  Referring provider: Leonel Cole, MD Primary care provider: Leonel Cole, MD  Chief Complaint: Follow-up (Urticaria )  History of Present Illness: I had the pleasure of seeing Kari Torres for a follow up visit at the [facility] on 11/10/2023. She is a 58 y.o. female, who is being followed for chronic urticaria on Xolair , adverse food reaction and allergic rhinitis. Her previous allergy  office visit was on 09/02/2023 with Dr. Luke. Today is a regular follow up visit.  Discussed the use of AI scribe software for clinical note transcription with the patient, who gave verbal consent to proceed.    She has a history of chronic hives. Since starting Xolair  in June, her hives cleared by June 18th, and she has not experienced any further episodes. She is currently taking Allegra 360mg  twice a day, Pepcid  twice a day, and Singulair  once a day. Hydroxyzine  has not been needed since her hives resolved.  She has diabetes and was previously on Jardiance. Due to concerns about exacerbating her hives, she switched back to Farxiga, which she obtains through a coupon program due to insurance issues. She is negotiating with her employer for assistance with medication costs.  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 She has successfully reintroduced other foods like macadamia, egg, wheat, corn, and sesame without issues. She carries an EpiPen  for emergencies.  Her primary care doctor noted a low white blood cell count. She has a history of breast cancer and sees her oncologist every six months.  No fevers, chills, or breathing difficulties. Normal eating and bathroom habits.     Assessment and Plan: Kari Torres is a 58 y.o. female with: Chronic urticaria Past history - Recurrent hives since mid-September, primarily on the torso, lasting a few hours each episode. Noted to worsen  with salmon consumption. No other associated symptoms. No recent changes in diet, medications, or personal care products. 2024 labs unremarkable except for positive shellfish and peanuts. Breast cancer dx in 2023. Started Xolair  on 09/02/2023.  Interim history - resolved with Xolair  but also back on her Farxiga. Continue allegra 2 tablets twice a day.  Continue Pepcid  (famotidine ) 20mg  twice a day.  Continue Singulair  (montelukast ) 10mg  daily at night. May take hydroxyzine  25mg  every 8 hours as needed for breakthrough hives. Avoid the following potential triggers: alcohol, tight clothing, NSAIDs, hot showers and getting overheated. Continue Xolair  300mg  every 4 weeks. Step down schedule:  Decrease Allegra to 180mg  in the morning and 360mg  in the evening. Continue Pepcid  20mg  twice a day and Singulair  10mg  daily. If no hives/itching for 2 weeks then: Decrease Allegra to 180mg  twice a day. Continue Pepcid  20mg  twice a day and Singulair  10mg  daily. If no hives/itching for 2 weeks then: Stop Singulair . Continue Allegra 180mg  twice a day. Continue Pepcid  20mg  twice a day. If no hives/itching for 2 weeks then: Decrease Pepcid  to 20mg  once a day. Continue Allegra 180mg  twice a day. If no hives/itching for 2 weeks then: Decrease Allegra to 180mg  once a day. Continue Pepcid  20mg  once a day. If no hives/itching for 2 weeks then: Stop Pepcid . Continue allegra 180mg  once a day. If no symptoms for 2 weeks then: Stop allegra.  If you get symptoms then go back to the dose where you didn't have any symptoms.    Other adverse food reactions, not elsewhere classified, subsequent encounter Past history -  2024 bloodwork positive to peanut , clam, shrimp, scallop, crab, lobster, oyster. Borderline to macadamia, egg, wheat, corn and sesame seed. Negative to chocolate and finned fish. Known allergy  to shellfish. Finned fish flared hives? Interim history - no reactions. Tolerates macadamia, egg, wheat, corn and sesame  without any issues.  Avoid all seafood, peanuts.  Consider finned fish reintroduction at next visit as labs were negative and used to tolerate in the past.  For mild symptoms you can take over the counter antihistamines (zyrtec 10mg  to 20mg ) and monitor symptoms closely.  If symptoms worsen or if you have severe symptoms including breathing issues, throat closure, significant swelling, whole body hives, severe diarrhea and vomiting, lightheadedness then use epinephrine  and seek immediate medical care afterwards. Emergency action plan in place.    Seasonal and perennial allergic rhinitis Past history - 2024 bloodwork positive to dust mites, cat, dog, grass, cockroach, trees, ragweed. Continue environmental control measures.  Return in about 6 months (around 05/12/2024).  Meds ordered this encounter  Medications   montelukast  (SINGULAIR ) 10 MG tablet    Sig: Take 1 tablet (10 mg total) by mouth at bedtime.    Dispense:  90 tablet    Refill:  2   famotidine  (PEPCID ) 20 MG tablet    Sig: Take 1 tablet (20 mg total) by mouth 2 (two) times daily.    Dispense:  180 tablet    Refill:  1   Lab Orders  No laboratory test(s) ordered today    Diagnostics: None.   Medication List:  Current Outpatient Medications  Medication Sig Dispense Refill   anastrozole  (ARIMIDEX ) 1 MG tablet TAKE 1 TABLET BY MOUTH EVERY DAY 90 tablet 3   Apple Cider Vinegar 500 MG TABS Take 500 mg by mouth daily. 2 gummys, orally once a day.     aspirin (ASPIRIN 81) 81 MG chewable tablet Chew 81 mg by mouth as needed.     atenolol (TENORMIN) 100 MG tablet Take 100 mg by mouth daily.     atorvastatin (LIPITOR) 20 MG tablet Take 20 mg by mouth daily.     Cholecalciferol (VITAMIN D) 50 MCG (2000 UT) CAPS Take 2,000 Units by mouth daily. 2 gummies orally once a day.     Collagen-Vitamin C (COLLAGEN PLUS VITAMIN C PO) Take by mouth.     conjugated estrogens  (PREMARIN ) vaginal cream Place 1 Applicatorful vaginally 3 (three)  times a week. Throw out applicator. Place small amount of cream on your finger tip and insert into the vagina 3 times a week at night. 42.5 g 1   EPINEPHrine  0.3 mg/0.3 mL IJ SOAJ injection Inject 0.3 mg into the muscle as needed for anaphylaxis. 2 each 1   fexofenadine (ALLEGRA ALLERGY ) 180 MG tablet 1 tablet Swallow whole with water ; do not take with fruit juices. Orally Once a day     hydrochlorothiazide  (HYDRODIURIL ) 12.5 MG tablet Take 1 tablet (12.5 mg total) by mouth daily. 30 tablet 0   hydrochlorothiazide  (HYDRODIURIL ) 25 MG tablet Take 25 mg by mouth daily.     LUTEIN PO Take 20 mg by mouth daily.     metFORMIN (GLUCOPHAGE) 1000 MG tablet Take 1,000 mg by mouth 2 (two) times daily.     Multiple Vitamins-Minerals (CENTRUM ADULTS PO) Take 1 tablet by mouth daily.     omalizumab  (XOLAIR ) 300 MG/2  ML prefilled syringe Inject 300 mg into the skin every 28 (twenty-eight) days. 2 mL 11   Probiotic Product (PROBIOTIC DAILY PO) Take 1 capsule  by mouth daily.     TIADYLT ER 240 MG 24 hr capsule Take 240 mg by mouth daily.     Vaginal Lubricant (REVAREE) SUPP Place 1 suppository vaginally 2 (two) times a week.     famotidine  (PEPCID ) 20 MG tablet Take 1 tablet (20 mg total) by mouth 2 (two) times daily. 180 tablet 1   hydrOXYzine  (ATARAX ) 25 MG tablet Take 1 tablet (25 mg total) by mouth 3 (three) times daily as needed for itching. (Patient not taking: Reported on 11/10/2023) 30 tablet 2   montelukast  (SINGULAIR ) 10 MG tablet Take 1 tablet (10 mg total) by mouth at bedtime. 90 tablet 2   Current Facility-Administered Medications  Medication Dose Route Frequency Provider Last Rate Last Admin   omalizumab  (XOLAIR ) prefilled syringe 300 mg  300 mg Subcutaneous Q28 days Luke Orlan HERO, DO   300 mg at 10/28/23 1744   Allergies: Allergies  Allergen Reactions   Shellfish Allergy  Hives   Peanut  (Diagnostic) Hives and Itching   I reviewed her past medical history, social history, family history, and  environmental history and no significant changes have been reported from her previous visit.  Review of Systems  Constitutional:  Negative for appetite change, chills, fever and unexpected weight change.  HENT:  Negative for congestion and rhinorrhea.   Eyes:  Negative for itching.  Respiratory:  Negative for cough, chest tightness, shortness of breath and wheezing.   Cardiovascular:  Negative for chest pain.  Gastrointestinal:  Negative for abdominal pain.  Genitourinary:  Negative for difficulty urinating.  Skin:  Negative for rash.  Allergic/Immunologic: Positive for environmental allergies and food allergies.  Neurological:  Negative for headaches.    Objective: BP (!) 116/90 (BP Location: Right Arm, Patient Position: Sitting, Cuff Size: Normal)   Pulse 68   Temp 98.6 F (37 C) (Temporal)   Resp 16   Ht 5' 6.5 (1.689 m)   Wt 129 lb 11.2 oz (58.8 kg)   SpO2 100%   BMI 20.62 kg/m  Body mass index is 20.62 kg/m. Physical Exam Vitals and nursing note reviewed.  Constitutional:      Appearance: Normal appearance. She is well-developed.  HENT:     Head: Normocephalic and atraumatic.     Right Ear: Tympanic membrane and external ear normal.     Left Ear: Tympanic membrane and external ear normal.     Nose: Nose normal.     Mouth/Throat:     Mouth: Mucous membranes are moist.     Pharynx: Oropharynx is clear.  Eyes:     Conjunctiva/sclera: Conjunctivae normal.  Cardiovascular:     Rate and Rhythm: Normal rate and regular rhythm.     Heart sounds: Normal heart sounds. No murmur heard.    No friction rub. No gallop.  Pulmonary:     Effort: Pulmonary effort is normal.     Breath sounds: Normal breath sounds. No wheezing, rhonchi or rales.  Musculoskeletal:     Cervical back: Neck supple.  Skin:    General: Skin is warm.     Findings: Rash present.     Comments: Diffuse hives on neck, forehead, torso, upper legs b/l.  Neurological:     Mental Status: She is alert and  oriented to person, place, and time.  Psychiatric:        Behavior: Behavior normal.    Previous notes and tests were reviewed. The plan was reviewed with the patient/family, and all questions/concerned were addressed.  It was my pleasure  to see Kari Torres today and participate in her care. Please feel free to contact me with any questions or concerns.  Sincerely,  Orlan Cramp, DO Allergy  & Immunology  [facility]  [place] office: [phone] [facility] office: [phone]

## 2023-11-11 ENCOUNTER — Inpatient Hospital Stay: Attending: Hematology and Oncology | Admitting: Hematology and Oncology

## 2023-11-11 ENCOUNTER — Ambulatory Visit: Admitting: Allergy

## 2023-11-11 VITALS — BP 138/70 | HR 64 | Temp 98.4°F | Resp 17 | Wt 130.9 lb

## 2023-11-11 DIAGNOSIS — Z803 Family history of malignant neoplasm of breast: Secondary | ICD-10-CM | POA: Diagnosis not present

## 2023-11-11 DIAGNOSIS — N6489 Other specified disorders of breast: Secondary | ICD-10-CM | POA: Diagnosis not present

## 2023-11-11 DIAGNOSIS — C50412 Malignant neoplasm of upper-outer quadrant of left female breast: Secondary | ICD-10-CM | POA: Diagnosis present

## 2023-11-11 DIAGNOSIS — Z79899 Other long term (current) drug therapy: Secondary | ICD-10-CM | POA: Insufficient documentation

## 2023-11-11 DIAGNOSIS — Z1509 Genetic susceptibility to other malignant neoplasm: Secondary | ICD-10-CM

## 2023-11-11 DIAGNOSIS — E119 Type 2 diabetes mellitus without complications: Secondary | ICD-10-CM | POA: Insufficient documentation

## 2023-11-11 DIAGNOSIS — Z79811 Long term (current) use of aromatase inhibitors: Secondary | ICD-10-CM | POA: Insufficient documentation

## 2023-11-11 DIAGNOSIS — Z833 Family history of diabetes mellitus: Secondary | ICD-10-CM | POA: Insufficient documentation

## 2023-11-11 DIAGNOSIS — Z8249 Family history of ischemic heart disease and other diseases of the circulatory system: Secondary | ICD-10-CM | POA: Diagnosis not present

## 2023-11-11 DIAGNOSIS — Z8052 Family history of malignant neoplasm of bladder: Secondary | ICD-10-CM | POA: Diagnosis not present

## 2023-11-11 DIAGNOSIS — Z1501 Genetic susceptibility to malignant neoplasm of breast: Secondary | ICD-10-CM | POA: Diagnosis not present

## 2023-11-11 DIAGNOSIS — Z825 Family history of asthma and other chronic lower respiratory diseases: Secondary | ICD-10-CM | POA: Insufficient documentation

## 2023-11-11 DIAGNOSIS — Z170 Estrogen receptor positive status [ER+]: Secondary | ICD-10-CM | POA: Diagnosis not present

## 2023-11-11 DIAGNOSIS — Z809 Family history of malignant neoplasm, unspecified: Secondary | ICD-10-CM | POA: Diagnosis not present

## 2023-11-11 DIAGNOSIS — Z90721 Acquired absence of ovaries, unilateral: Secondary | ICD-10-CM | POA: Insufficient documentation

## 2023-11-11 DIAGNOSIS — Z808 Family history of malignant neoplasm of other organs or systems: Secondary | ICD-10-CM | POA: Diagnosis not present

## 2023-11-11 DIAGNOSIS — L501 Idiopathic urticaria: Secondary | ICD-10-CM | POA: Insufficient documentation

## 2023-11-11 DIAGNOSIS — Z17411 Hormone receptor positive with human epidermal growth factor receptor 2 negative status: Secondary | ICD-10-CM | POA: Insufficient documentation

## 2023-11-11 DIAGNOSIS — D709 Neutropenia, unspecified: Secondary | ICD-10-CM | POA: Diagnosis not present

## 2023-11-11 DIAGNOSIS — Z1589 Genetic susceptibility to other disease: Secondary | ICD-10-CM

## 2023-11-11 NOTE — Progress Notes (Signed)
 BRIEF ONCOLOGIC HISTORY:  Oncology History  Malignant neoplasm of upper-outer quadrant of left breast in female, estrogen receptor positive (HCC)  11/06/2021 Mammogram   Bilateral screening mammogram shows left breast asymmetry which is indeterminate.  Additional views with possible ultrasound are recommended.  Left breast unilateral diagnostic mammogram confirmed a 1.6 x 1.1 cm upper outer aspect middle depth asymmetry with no other significant masses or calcifications.   11/12/2021 Pathology Results   Left breast needle core biopsy at 2:00 shows invasive ductal carcinoma, grade 2 prognostics ER 100% positive strong staining PR 50% positive moderate to strong staining Ki-67 of 5% and group 4 HER2 negative   11/19/2021 Initial Diagnosis   Malignant neoplasm of upper-outer quadrant of left breast in female, estrogen receptor positive (HCC)   11/20/2021 Cancer Staging   Staging form: Breast, AJCC 8th Edition - Clinical stage from 11/20/2021: Stage IA (cT1c, cN0, cM0, G2, ER+, PR+, HER2-) - Signed by Lanell Donald Stagger, PA-C on 11/20/2021 Method of lymph node assessment: Clinical Histologic grading system: 3 grade system   11/30/2021 Genetic Testing   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  [facility] report date is 11/30/2021.  [facility] CancerNext-Expanded +RNAinsight report date is 12/06/2021.   The CancerNext-Expanded gene panel offered by [facility] and includes sequencing, rearrangement, and RNA analysis for the following 77 genes: AIP, ALK, APC, ATM, AXIN2, BAP1, BARD1, BLM, BMPR1A, BRCA1, BRCA2, BRIP1, CDC73, CDH1, CDK4, CDKN1B, CDKN2A, CHEK2, CTNNA1, DICER1, FANCC, FH, FLCN, GALNT12, KIF1B, LZTR1, MAX, MEN1, MET, MLH1, MSH2, MSH3, MSH6, MUTYH, NBN, NF1, NF2, NTHL1, PALB2, PHOX2B, PMS2, POT1, PRKAR1A, PTCH1, PTEN, RAD51C, RAD51D, RB1, RECQL, RET, SDHA, SDHAF2, SDHB, SDHC, SDHD, SMAD4, SMARCA4, SMARCB1, SMARCE1, STK11, SUFU, TMEM127, TP53, TSC1, TSC2, VHL and XRCC2  (sequencing and deletion/duplication); EGFR, EGLN1, HOXB13, KIT, MITF, PDGFRA, POLD1, and POLE (sequencing only); EPCAM and GREM1 (deletion/duplication only).    12/06/2021 Surgery   Left breast lumpectomy: IDC, 1.5cm, g2, margins negative, 3 SLN negative, T1cN0   12/06/2021 Oncotype testing   25/12%   02/05/2022 - 03/06/2022 Radiation Therapy   Site Technique Total Dose (Gy) Dose per Fx (Gy) Completed Fx Beam Energies  Breast, Left: Breast_L 3D 42.56/42.56 2.66 16/16 6XFFF  Breast, Left: Breast_L_Bst 3D 8/8 2 4/4 6X, 10X     03/2022 -  Anti-estrogen oral therapy   Anastrozole    07/03/2022 Surgery   BSO: benign     INTERVAL HISTORY:  History of Present Illness    History of Present Illness Kari Torres is a 58 year old female with chronic hives and a history of breast cancer , skin rash and leukopenia here for follow up.  Kari Torres is a 58 year old female with history of breast cancer who presents for follow-up after starting Xolair  injections for chronic urticaria.  She has experienced significant improvement in her chronic urticaria since initiating Xolair  injections, with no hives since June 18th. She is currently tapering off antihistamines like Zyrtec and Allegra.  She has a history of a low white blood cell count, which was previously a concern. Recent blood work shows a normal white blood cell count. 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  Recent blood work indicated normal iron stores, thyroid  function, and folic acid  levels. Recent blood work showed a slight elevation in calcium and total protein levels, which have been present since at least 2023. No anti-nuclear antibodies were detected.  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   Urticaria    Past Surgical History:  Procedure Laterality Date   BREAST LUMPECTOMY Left 01/29/2017   Procedure: LEFT BREAST LUMPECTOMY SUBAREOLAR DISSECTION ERAS PATHWAY;  Surgeon: Gail Favorite, MD;  Location: [facility];  Service: General;  Laterality: Left;  LMA   BREAST LUMPECTOMY WITH RADIOACTIVE SEED AND SENTINEL LYMPH NODE BIOPSY Left 12/06/2021   Procedure: LEFT BREAST LUMPECTOMY WITH RADIOACTIVE SEED AND SENTINEL LYMPH NODE BIOPSY;  Surgeon: Aron Shoulders, MD;  Location: [facility];  Service: General;  Laterality: Left;   BREAST LUMPECTOMY WITH RADIOACTIVE SEED LOCALIZATION Left 09/15/2015   Procedure: LEFT BREAST LUMPECTOMY WITH RADIOACTIVE SEED LOCALIZATION;  Surgeon: Favorite Gail, MD;  Location: [facility];  Service: General;  Laterality: Left;   CESAREAN SECTION     ROBOTIC ASSISTED SALPINGO OOPHERECTOMY Bilateral 07/03/2022   Procedure: XI ROBOTIC ASSISTED SALPINGO OOPHORECTOMY;  Surgeon: Viktoria Comer SAUNDERS, MD;  Location: [facility];  Service: Gynecology;  Laterality: Bilateral;   WISDOM TOOTH EXTRACTION       ALLERGIES:  Allergies  Allergen Reactions   Shellfish Allergy  Hives   Peanut  (Diagnostic) Hives and Itching     CURRENT MEDICATIONS:  Outpatient Encounter Medications as of 11/11/2023  Medication Sig Note   anastrozole  (ARIMIDEX ) 1 MG tablet TAKE 1 TABLET BY MOUTH EVERY DAY    Apple Cider Vinegar 500 MG TABS Take 500 mg by mouth daily. 2 gummys, orally once a day.    aspirin (ASPIRIN 81) 81 MG chewable tablet Chew 81 mg by mouth as needed.    atenolol (TENORMIN) 100 MG tablet Take 100 mg by mouth daily.    atorvastatin (LIPITOR) 20 MG tablet Take 20 mg by mouth daily.    Cholecalciferol (VITAMIN D) 50 MCG  (2000 UT) CAPS Take 2,000 Units by mouth daily. 2 gummies orally once a day.    Collagen-Vitamin C (COLLAGEN PLUS VITAMIN C PO) Take by mouth.    Collagen-Vitamin C 740-125 MG CAPS Take 125 mg by mouth 6 (six) times daily.    conjugated estrogens  (PREMARIN ) vaginal cream Place 1 Applicatorful vaginally 3 (three) times a week. Throw out applicator. Place small amount of cream on your finger tip and insert into the vagina 3 times a week at night.    EPINEPHrine  0.3 mg/0.3 mL IJ SOAJ injection Inject 0.3 mg into the muscle as needed for anaphylaxis.    famotidine  (PEPCID ) 20 MG tablet Take 1 tablet (20 mg total) by mouth 2 (two) times daily.    FARXIGA 10 MG TABS tablet Take 10 mg by mouth daily.    fexofenadine (ALLEGRA ALLERGY ) 180 MG tablet 1 tablet Swallow whole with water ; do not take with fruit juices. Orally Once a day    hydrochlorothiazide  (HYDRODIURIL ) 25 MG tablet Take 25 mg by mouth daily.    LUTEIN PO Take 20 mg by mouth daily.    metFORMIN (GLUCOPHAGE) 1000 MG tablet Take 1,000 mg by mouth 2 (two) times daily.    montelukast  (SINGULAIR ) 10 MG tablet Take 1 tablet (10 mg total) by mouth at bedtime.  Multiple Vitamins-Minerals (CENTRUM ADULTS PO) Take 1 tablet by mouth daily.    omalizumab  (XOLAIR ) 300 MG/2  ML prefilled syringe Inject 300 mg into the skin every 28 (twenty-eight) days.    Probiotic Product (PROBIOTIC DAILY PO) Take 1 capsule by mouth daily.    TIADYLT ER 240 MG 24 hr capsule Take 240 mg by mouth daily. 06/17/2022: Diltiazem    Vaginal Lubricant (REVAREE) SUPP Place 1 suppository vaginally 2 (two) times a week.    hydrochlorothiazide  (HYDRODIURIL ) 12.5 MG tablet Take 1 tablet (12.5 mg total) by mouth daily. (Patient not taking: Reported on 11/11/2023)    hydrOXYzine  (ATARAX ) 25 MG tablet Take 1 tablet (25 mg total) by mouth 3 (three) times daily as needed for itching. (Patient not taking: Reported on 11/11/2023)    Facility-Administered Encounter Medications as of  11/11/2023  Medication   omalizumab  (XOLAIR ) prefilled syringe 300 mg     ONCOLOGIC FAMILY HISTORY:  Family History  Problem Relation Age of Onset   Hypertension Mother    Breast cancer Mother 9       met d. 63   Hypertension Father    Diabetes Father    CAD Father    Brain cancer Maternal Aunt        dx 22s; d. 58s   Cancer Maternal Uncle        unknown type; ? lung;  dx 60s   Hypertension Maternal Grandmother    Diabetes Maternal Grandmother    Congestive Heart Failure Maternal Grandmother    Diabetes Paternal Grandmother    Diabetes Paternal Grandfather    Brain cancer Cousin        mat female cousin; dx 65s; d. 8s   Asthma Half-Brother    Bladder Cancer Half-Brother        dx 8s; paternal half brother   Breast cancer Other        MGM's sister with breast cancer or other primary     SOCIAL HISTORY:  Social History   Socioeconomic History   Marital status: Divorced    Spouse name: Not on file   Number of children: Not on file   Years of education: Not on file   Highest education level: Not on file  Occupational History   Occupation: works as an Airline pilot  Tobacco Use   Smoking status: Never    Passive exposure: Never   Smokeless tobacco: Never  Vaping Use   Vaping status: Never Used  Substance and Sexual Activity   Alcohol use: Yes    Comment: occasional   Drug use: No   Sexual activity: Not Currently  Other Topics Concern   Not on file  Social History Narrative   Not on file   Social Drivers of Health   Financial Resource Strain: Low Risk  (11/21/2021)   Overall Financial Resource Strain (CARDIA)    Difficulty of Paying Living Expenses: Not very hard  Food Insecurity: No Food Insecurity (11/21/2021)   Hunger Vital Sign    Worried About Running Out of Food in the Last Year: Never true    Ran Out of Food in the Last Year: Never true  Transportation Needs: No Transportation Needs (11/21/2021)   PRAPARE - Administrator, Civil Service  (Medical): No    Lack of Transportation (Non-Medical): No  Physical Activity: Not on file  Stress: Not on file  Social Connections: Not on file  Intimate Partner Violence: Not on file     OBSERVATIONS/OBJECTIVE:  BP 138/70 (BP  Location: Right Arm, Patient Position: Sitting, Cuff Size: Small)   Pulse 64   Temp 98.4 F (36.9 C) (Oral)   Resp 17   Wt 130 lb 14.4 oz (59.4 kg)   SpO2 100%   BMI 20.81 kg/m   GENERAL: Patient is a well appearing female in no acute distress   LABORATORY DATA:  None for this visit.  DIAGNOSTIC IMAGING:  None for this visit.      ASSESSMENT AND PLAN:  Ms.. Ganaway is a pleasant 58 y.o. female with Stage IA left breast invasive ductal carcinoma, ER+/PR+/HER2-, diagnosed in 10/2021, treated with lumpectomy, adjuvant radiation therapy, and anti-estrogen therapy with Anastrozole  beginning in 03/2022.  Assessment & Plan  Chronic idiopathic urticaria, improved on Xolair  therapy Chronic idiopathic urticaria improved with Xolair , no hives since June 18th. Step-down from medications initiated by allergist. - Continue Xolair  injections. - Step down off medications as per allergist's guidance.  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 Under active surveillance with alternating mammogram and MRI screenings. - Continue alternating mammogram and MRI screenings. - Ensure mammogram is scheduled for August.  Breast cancer, on anastrozole  therapy Breast cancer managed with ongoing anastrozole  therapy since 2024. - Continue anastrozole  therapy.  Low-normal white blood cell count, under observation White blood cell count low-normal but stable. Previous concerns resolved. - Repeat CBC in six months.  Mildly elevated total protein, under observation Total protein levels mildly elevated but stable since 2023. No immediate intervention required. - Monitor total protein  levels.  Low-normal iron stores, considering supplementation Iron stores low-normal, considering supplementation to optimize levels. - Recommend taking iron supplement twice a week.  RTC as recommended in 6 months Time spent: 30 min  *Total Encounter Time as defined by the Centers for Medicare and Medicaid Services includes, in addition to the face-to-face time of a patient visit (documented in the note above) non-face-to-face time: obtaining and reviewing outside history, ordering and reviewing medications, tests or procedures, care coordination (communications with other health care professionals or caregivers) and documentation in the medical record.

## 2023-11-21 ENCOUNTER — Encounter: Payer: Self-pay | Admitting: Hematology and Oncology

## 2023-11-27 ENCOUNTER — Ambulatory Visit (INDEPENDENT_AMBULATORY_CARE_PROVIDER_SITE_OTHER)

## 2023-11-27 DIAGNOSIS — L508 Other urticaria: Secondary | ICD-10-CM

## 2023-12-25 ENCOUNTER — Ambulatory Visit

## 2024-01-01 ENCOUNTER — Ambulatory Visit (INDEPENDENT_AMBULATORY_CARE_PROVIDER_SITE_OTHER)

## 2024-01-01 DIAGNOSIS — L501 Idiopathic urticaria: Secondary | ICD-10-CM

## 2024-01-29 ENCOUNTER — Ambulatory Visit (INDEPENDENT_AMBULATORY_CARE_PROVIDER_SITE_OTHER)

## 2024-01-29 DIAGNOSIS — L501 Idiopathic urticaria: Secondary | ICD-10-CM

## 2024-02-10 ENCOUNTER — Other Ambulatory Visit: Payer: Self-pay | Admitting: Hematology and Oncology

## 2024-02-26 ENCOUNTER — Ambulatory Visit

## 2024-02-26 DIAGNOSIS — L501 Idiopathic urticaria: Secondary | ICD-10-CM | POA: Diagnosis not present

## 2024-03-08 ENCOUNTER — Telehealth: Payer: Self-pay | Admitting: Hematology and Oncology

## 2024-03-08 NOTE — Telephone Encounter (Signed)
 Left patient a message regarding 04/12/24 appt being rescheduled to 04/14/24. Patient is aware of new appt date and time.

## 2024-03-29 ENCOUNTER — Encounter: Payer: Self-pay | Admitting: Hematology and Oncology

## 2024-03-29 ENCOUNTER — Ambulatory Visit

## 2024-03-29 DIAGNOSIS — L501 Idiopathic urticaria: Secondary | ICD-10-CM | POA: Diagnosis not present

## 2024-04-05 ENCOUNTER — Ambulatory Visit: Attending: General Surgery

## 2024-04-05 VITALS — Wt 128.1 lb

## 2024-04-05 DIAGNOSIS — Z483 Aftercare following surgery for neoplasm: Secondary | ICD-10-CM | POA: Insufficient documentation

## 2024-04-05 NOTE — Therapy (Signed)
 " OUTPATIENT PHYSICAL THERAPY SOZO SCREENING NOTE   Patient Name: Kari Torres MRN: [id number] DOB:01-11-1966, 59 y.o., female Today's Date: 04/05/2024  PCP: Leonel Cole, MD REFERRING PROVIDER: Aron Shoulders, MD   PT End of Session - 04/05/24 1614     Visit Number 7   # unchanged due to screen only   PT Start Time 1613    PT Stop Time 1617    PT Time Calculation (min) 4 min    Activity Tolerance Patient tolerated treatment well    Behavior During Therapy [facility] for tasks assessed/performed          Past Medical History:  Diagnosis Date   Allergic rhinitis due to pollen    Bloody discharge from left nipple 01/29/2017   Breast cancer (HCC)    Diabetes mellitus without complication (HCC)    Family history of breast cancer 11/21/2021   Heart murmur    Hypercholesterolemia    Hypertension    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   Urticaria    Past Surgical History:  Procedure Laterality Date   BREAST LUMPECTOMY Left 01/29/2017   Procedure: LEFT BREAST LUMPECTOMY SUBAREOLAR DISSECTION ERAS PATHWAY;  Surgeon: Gail Favorite, MD;  Location: [facility];  Service: General;  Laterality: Left;  LMA   BREAST LUMPECTOMY WITH RADIOACTIVE SEED AND SENTINEL LYMPH NODE BIOPSY Left 12/06/2021   Procedure: LEFT BREAST LUMPECTOMY WITH RADIOACTIVE SEED AND SENTINEL LYMPH NODE BIOPSY;  Surgeon: Aron Shoulders, MD;  Location: [facility];  Service: General;  Laterality: Left;   BREAST LUMPECTOMY WITH RADIOACTIVE SEED LOCALIZATION Left 09/15/2015   Procedure: LEFT BREAST LUMPECTOMY WITH RADIOACTIVE SEED LOCALIZATION;  Surgeon: Favorite Gail, MD;  Location: [facility];  Service: General;  Laterality: Left;   CESAREAN SECTION     ROBOTIC ASSISTED SALPINGO OOPHERECTOMY Bilateral 07/03/2022   Procedure: XI ROBOTIC ASSISTED SALPINGO OOPHORECTOMY;  Surgeon: Viktoria Comer SAUNDERS, MD;  Location: [facility];  Service: Gynecology;  Laterality: Bilateral;   WISDOM  TOOTH EXTRACTION     Patient Active Problem List   Diagnosis Date Noted   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   Genetic testing 11/30/2021   Family history of breast cancer 11/21/2021   Family history of brain cancer 11/21/2021   Family history of bladder cancer 11/21/2021   Malignant neoplasm of upper-outer quadrant of left breast in female, estrogen receptor positive (HCC) 11/19/2021   Hypertensive disorder 07/12/2021   Diabetes mellitus (HCC) 07/12/2021   Bloody discharge from left nipple 01/29/2017   Uncontrolled 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 It measures 1.6 cm and is located in the upper outer quadrant. It is ER/PR positive and HER2 negative with a Ki67 of 5%. She is s/p left lumpectomy with SLNB on 12/06/2021 with 0/3 LN. She  does not require chemo, but will have radiation.She also has diabetes and hypertension I ddi well with my ROM initially, and lost some when the hematoma happened. I have been doing the exercises.   PRECAUTIONS: left UE Lymphedema risk, None  SUBJECTIVE: Pt returns for her first 6 month L-Dex screen.   PAIN:  Are you having pain?  No  SOZO SCREENING: Patient was assessed today using the SOZO machine to determine the lymphedema index score. This was compared to her baseline score. It was determined that she is within the recommended range when compared to her baseline and no further action is needed at this time. She will continue SOZO screenings. These are done every 3 months for 2 years post operatively followed by every 6 months for 2 years, and  then annually.   L-DEX FLOWSHEETS - 04/05/24 1600       L-DEX LYMPHEDEMA SCREENING   Measurement Type Unilateral    L-DEX MEASUREMENT EXTREMITY Upper Extremity    POSITION  Standing    DOMINANT SIDE Right    At Risk Side Left    BASELINE SCORE (UNILATERAL) 0.6    L-DEX SCORE (UNILATERAL) -2    VALUE CHANGE (UNILAT) -2.6         P: Cont every 6 month screens until 07/2025.    Aden Berwyn Caldron, PTA 04/05/2024, 4:16 PM    "

## 2024-04-12 ENCOUNTER — Other Ambulatory Visit

## 2024-04-12 ENCOUNTER — Ambulatory Visit: Admitting: Hematology and Oncology

## 2024-04-14 ENCOUNTER — Inpatient Hospital Stay: Attending: Hematology and Oncology

## 2024-04-14 ENCOUNTER — Inpatient Hospital Stay: Admitting: Hematology and Oncology

## 2024-04-14 VITALS — BP 142/75 | HR 74 | Temp 97.3°F | Resp 16 | Wt 129.4 lb

## 2024-04-14 DIAGNOSIS — Z1509 Genetic susceptibility to other malignant neoplasm: Secondary | ICD-10-CM | POA: Diagnosis not present

## 2024-04-14 DIAGNOSIS — Z1501 Genetic susceptibility to malignant neoplasm of breast: Secondary | ICD-10-CM

## 2024-04-14 DIAGNOSIS — Z170 Estrogen receptor positive status [ER+]: Secondary | ICD-10-CM | POA: Diagnosis not present

## 2024-04-14 DIAGNOSIS — D709 Neutropenia, unspecified: Secondary | ICD-10-CM | POA: Diagnosis not present

## 2024-04-14 DIAGNOSIS — C50412 Malignant neoplasm of upper-outer quadrant of left female breast: Secondary | ICD-10-CM | POA: Diagnosis not present

## 2024-04-14 DIAGNOSIS — Z1589 Genetic susceptibility to other disease: Secondary | ICD-10-CM

## 2024-04-14 LAB — CBC WITH DIFFERENTIAL/PLATELET
Abs Immature Granulocytes: 0.01 K/uL (ref 0.00–0.07)
Basophils Absolute: 0.1 K/uL (ref 0.0–0.1)
Basophils Relative: 2 %
Eosinophils Absolute: 0.2 K/uL (ref 0.0–0.5)
Eosinophils Relative: 4 %
HCT: 40.8 % (ref 36.0–46.0)
Hemoglobin: 13.6 g/dL (ref 12.0–15.0)
Immature Granulocytes: 0 %
Lymphocytes Relative: 32 %
Lymphs Abs: 1.7 K/uL (ref 0.7–4.0)
MCH: 29.9 pg (ref 26.0–34.0)
MCHC: 33.3 g/dL (ref 30.0–36.0)
MCV: 89.7 fL (ref 80.0–100.0)
Monocytes Absolute: 0.6 K/uL (ref 0.1–1.0)
Monocytes Relative: 10 %
Neutro Abs: 2.8 K/uL (ref 1.7–7.7)
Neutrophils Relative %: 52 %
Platelets: 324 K/uL (ref 150–400)
RBC: 4.55 MIL/uL (ref 3.87–5.11)
RDW: 12.5 % (ref 11.5–15.5)
WBC: 5.4 K/uL (ref 4.0–10.5)
nRBC: 0 % (ref 0.0–0.2)

## 2024-04-14 LAB — TSH: TSH: 1.26 u[IU]/mL (ref 0.350–4.500)

## 2024-04-14 NOTE — Progress Notes (Signed)
 BRIEF ONCOLOGIC HISTORY:  Oncology History  Malignant neoplasm of upper-outer quadrant of left breast in female, estrogen receptor positive (HCC)  11/06/2021 Mammogram   Bilateral screening mammogram shows left breast asymmetry which is indeterminate.  Additional views with possible ultrasound are recommended.  Left breast unilateral diagnostic mammogram confirmed a 1.6 x 1.1 cm upper outer aspect middle depth asymmetry with no other significant masses or calcifications.   11/12/2021 Pathology Results   Left breast needle core biopsy at 2:00 shows invasive ductal carcinoma, grade 2 prognostics ER 100% positive strong staining PR 50% positive moderate to strong staining Ki-67 of 5% and group 4 HER2 negative   11/19/2021 Initial Diagnosis   Malignant neoplasm of upper-outer quadrant of left breast in female, estrogen receptor positive (HCC)   11/20/2021 Cancer Staging   Staging form: Breast, AJCC 8th Edition - Clinical stage from 11/20/2021: Stage IA (cT1c, cN0, cM0, G2, ER+, PR+, HER2-) - Signed by Lanell Donald Stagger, PA-C on 11/20/2021 Method of lymph node assessment: Clinical Histologic grading system: 3 grade system   11/30/2021 Genetic Testing   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  [facility] BRCAP[facility]us report date is 11/30/2021.  [facility] CancerNext-Expanded +RNAinsight report date is 12/06/2021.   The CancerNext-Expanded gene panel offered by [facility] and includes sequencing, rearrangement, and RNA analysis for the following 77 genes: AIP, ALK, APC, ATM, AXIN2, BAP1, BARD1, BLM, BMPR1A, BRCA1, BRCA2, BRIP1, CDC73, CDH1, CDK4, CDKN1B, CDKN2A, CHEK2, CTNNA1, DICER1, FANCC, FH, FLCN, GALNT12, KIF1B, LZTR1, MAX, MEN1, MET, MLH1, MSH2, MSH3, MSH6, MUTYH, NBN, NF1, NF2, NTHL1, PALB2, PHOX2B, PMS2, POT1, PRKAR1A, PTCH1, PTEN, RAD51C, RAD51D, RB1, RECQL, RET, SDHA, SDHAF2, SDHB, SDHC, SDHD, SMAD4, SMARCA4, SMARCB1, SMARCE1, STK11, SUFU, TMEM127, TP53, TSC1, TSC2, VHL and XRCC2  (sequencing and deletion/duplication); EGFR, EGLN1, HOXB13, KIT, MITF, PDGFRA, POLD1, and POLE (sequencing only); EPCAM and GREM1 (deletion/duplication only).    12/06/2021 Surgery   Left breast lumpectomy: IDC, 1.5cm, g2, margins negative, 3 SLN negative, T1cN0   12/06/2021 Oncotype testing   25/12%   02/05/2022 - 03/06/2022 Radiation Therapy   Site Technique Total Dose (Gy) Dose per Fx (Gy) Completed Fx Beam Energies  Breast, Left: Breast_L 3D 42.56/42.56 2.66 16/16 6XFFF  Breast, Left: Breast_L_Bst 3D 8/8 2 4/4 6X, 10X     03/2022 -  Anti-estrogen oral therapy   Anastrozole    07/03/2022 Surgery   BSO: benign     INTERVAL HISTORY:  History of Present Illness    History of Present Illness  Kari Torres is a 59 year old female with hormone receptor-positive left breast cancer, status post surgery, currently on adjuvant anastrozole , presenting for routine oncology follow-up.  She is two years into adjuvant anastrozole  therapy and tolerates the medication well, with only mild, intermittent myalgias and arthralgias, which she attributes to age and menopause. These symptoms are transient and do not interfere with daily activities. She has not experienced significant adverse effects from anastrozole .  Surveillance imaging is current, with the most recent mammogram reported as normal. She alternates breast MRIs and mammograms for surveillance and has an MRI scheduled for February 6th. Recent laboratory studies demonstrate normal white blood cell count and hemoglobin. She is not taking iron supplements and has not required them recently.  She describes intermittent episodes of a pulling sensation and pressure in the left arm with reaching, localized between two surgical incisions on the left breast. The sensation resolves with cessation of movement and is not associated with chronic pain. Monthly lymphatic massage provides symptomatic relief. She  has not noted new breast masses or nipple  discharge.  She has not experienced recent illnesses, including influenza, despite exposure, and has received the seasonal influenza vaccine. No other acute symptoms or complications were reported.   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   Urticaria    Past Surgical History:  Procedure Laterality Date   BREAST LUMPECTOMY Left 01/29/2017   Procedure: LEFT BREAST LUMPECTOMY SUBAREOLAR DISSECTION ERAS PATHWAY;  Surgeon: Gail Favorite, MD;  Location: [facility];  Service: General;  Laterality: Left;  LMA   BREAST LUMPECTOMY WITH RADIOACTIVE SEED AND SENTINEL LYMPH NODE BIOPSY Left 12/06/2021   Procedure: LEFT BREAST LUMPECTOMY WITH RADIOACTIVE SEED AND SENTINEL LYMPH NODE BIOPSY;  Surgeon: Aron Shoulders, MD;  Location: [facility];  Service: General;  Laterality: Left;   BREAST LUMPECTOMY WITH RADIOACTIVE SEED LOCALIZATION Left 09/15/2015   Procedure: LEFT BREAST LUMPECTOMY WITH RADIOACTIVE SEED LOCALIZATION;  Surgeon: Favorite Gail, MD;  Location: [facility];  Service: General;  Laterality: Left;   CESAREAN SECTION     ROBOTIC ASSISTED SALPINGO OOPHERECTOMY Bilateral 07/03/2022   Procedure: XI ROBOTIC ASSISTED SALPINGO OOPHORECTOMY;  Surgeon: Viktoria Comer SAUNDERS, MD;  Location: [facility];  Service: Gynecology;  Laterality: Bilateral;   WISDOM TOOTH EXTRACTION       ALLERGIES:  Allergies  Allergen Reactions   Shellfish Allergy  Hives   Peanut  (Diagnostic) Hives and Itching     CURRENT MEDICATIONS:  Outpatient Encounter Medications as of 04/14/2024  Medication Sig Note   anastrozole  (ARIMIDEX ) 1 MG tablet TAKE 1  TABLET BY MOUTH EVERY DAY    Apple Cider Vinegar 500 MG TABS Take 500 mg by mouth daily. 2 gummys, orally once a day.    aspirin (ASPIRIN 81) 81 MG chewable tablet Chew 81 mg by mouth as needed.    atenolol (TENORMIN) 100 MG tablet Take 100 mg by mouth daily.    atorvastatin (LIPITOR) 20 MG tablet Take 20 mg by mouth daily.    Cholecalciferol (VITAMIN D) 50 MCG (2000 UT) CAPS Take 2,000 Units by mouth daily. 2 gummies orally once a day.    Collagen-Vitamin C (COLLAGEN PLUS VITAMIN C PO) Take by mouth.    Collagen-Vitamin C 740-125 MG CAPS Take 125 mg by mouth 6 (six) times daily.    conjugated estrogens  (PREMARIN ) vaginal cream Place 1 Applicatorful vaginally 3 (three) times a week. Throw out applicator. Place small amount of cream on your finger tip and insert into the vagina 3 times a week at night.    EPINEPHrine  0.3 mg/0.3 mL IJ SOAJ injection Inject 0.3 mg into the muscle as needed for anaphylaxis.    famotidine  (PEPCID ) 20 MG tablet Take 1 tablet (20 mg total) by mouth 2 (two) times daily.    FARXIGA 10 MG TABS tablet Take 10 mg by mouth daily.    fexofenadine (ALLEGRA ALLERGY ) 180 MG tablet 1 tablet Swallow whole with water ; do not take with fruit juices. Orally Once a day    hydrochlorothiazide  (HYDRODIURIL ) 12.5 MG tablet Take 1 tablet (12.5 mg total) by mouth daily. (Patient not taking: Reported on 11/11/2023)  hydrochlorothiazide  (HYDRODIURIL ) 25 MG tablet Take 25 mg by mouth daily.    hydrOXYzine  (ATARAX ) 25 MG tablet Take 1 tablet (25 mg total) by mouth 3 (three) times daily as needed for itching. (Patient not taking: Reported on 11/11/2023)    LUTEIN PO Take 20 mg by mouth daily.    metFORMIN (GLUCOPHAGE) 1000 MG tablet Take 1,000 mg by mouth 2 (two) times daily.    montelukast  (SINGULAIR ) 10 MG tablet Take 1 tablet (10 mg total) by mouth at bedtime.    Multiple Vitamins-Minerals (CENTRUM ADULTS PO) Take 1 tablet by mouth daily.    omalizumab  (XOLAIR ) 300 MG/2  ML prefilled syringe  Inject 300 mg into the skin every 28 (twenty-eight) days.    Probiotic Product (PROBIOTIC DAILY PO) Take 1 capsule by mouth daily.    TIADYLT ER 240 MG 24 hr capsule Take 240 mg by mouth daily. 06/17/2022: Diltiazem    Vaginal Lubricant (REVAREE) SUPP Place 1 suppository vaginally 2 (two) times a week.    Facility-Administered Encounter Medications as of 04/14/2024  Medication   omalizumab  (XOLAIR ) prefilled syringe 300 mg     ONCOLOGIC FAMILY HISTORY:  Family History  Problem Relation Age of Onset   Hypertension Mother    Breast cancer Mother 65       met d. 45   Hypertension Father    Diabetes Father    CAD Father    Brain cancer Maternal Aunt        dx 27s; d. 44s   Cancer Maternal Uncle        unknown type; ? lung;  dx 60s   Hypertension Maternal Grandmother    Diabetes Maternal Grandmother    Congestive Heart Failure Maternal Grandmother    Diabetes Paternal Grandmother    Diabetes Paternal Grandfather    Brain cancer Cousin        mat female cousin; dx 94s; d. 85s   Asthma Half-Brother    Bladder Cancer Half-Brother        dx 22s; paternal half brother   Breast cancer Other        MGM's sister with breast cancer or other primary     SOCIAL HISTORY:  Social History   Socioeconomic History   Marital status: Divorced    Spouse name: Not on file   Number of children: Not on file   Years of education: Not on file   Highest education level: Not on file  Occupational History   Occupation: works as an airline pilot  Tobacco Use   Smoking status: Never    Passive exposure: Never   Smokeless tobacco: Never  Vaping Use   Vaping status: Never Used  Substance and Sexual Activity   Alcohol use: Yes    Comment: occasional   Drug use: No   Sexual activity: Not Currently  Other Topics Concern   Not on file  Social History Narrative   Not on file   Social Drivers of Health   Tobacco Use: Low Risk (03/05/2024)   Received from [facility]   Patient History     Smoking Tobacco Use: Never    Smokeless Tobacco Use: Never    Passive Exposure: Not on file  Financial Resource Strain: Low Risk (11/21/2021)   Overall Financial Resource Strain (CARDIA)    Difficulty of Paying Living Expenses: Not very hard  Food Insecurity: No Food Insecurity (11/21/2021)   Hunger Vital Sign    Worried About Running Out of Food in the Last Year: Never true  Ran Out of Food in the Last Year: Never true  Transportation Needs: No Transportation Needs (11/21/2021)   PRAPARE - Administrator, Civil Service (Medical): No    Lack of Transportation (Non-Medical): No  Physical Activity: Not on file  Stress: Not on file  Social Connections: Not on file  Intimate Partner Violence: Not on file  Depression (EYV7-0): Not on file  Alcohol Screen: Not on file  Housing: Low Risk (11/21/2021)   Housing    Last Housing Risk Score: 0  Utilities: Not on file  Health Literacy: Not on file     OBSERVATIONS/OBJECTIVE:  BP (!) 142/75 (BP Location: Left Arm, Patient Position: Sitting)   Pulse 74   Temp (!) 97.3 F (36.3 C) (Temporal)   Resp 16   Wt 129 lb 6.4 oz (58.7 kg)   SpO2 100%   BMI 20.57 kg/m   GENERAL: Patient is a well appearing female in no acute distress Bilateral breasts examined. Post treatment changes noted. NO palpable adenopathy CTA bilaterally RRR No LE edema.  LABORATORY DATA:  None for this visit.  DIAGNOSTIC IMAGING:  None for this visit.      ASSESSMENT AND PLAN:  Ms.. Chamberlin is a pleasant 59 y.o. female with Stage IA left breast invasive ductal carcinoma, ER+/PR+/HER2-, diagnosed in 10/2021, treated with lumpectomy, adjuvant radiation therapy, and anti-estrogen therapy with Anastrozole  beginning in 03/2022.  Assessment & Plan  PALB2 mutation with increased breast cancer risk, under active surveillance PALB2 mutation increases breast cancer risk. Under active surveillance with alternating mammogram and MRI screenings. - Continue  alternating mammogram and MRI screenings.  Breast cancer of the left breast, status post surgery, on hormonal therapy Two years post-surgery, on anastrozole  with mild musculoskeletal aches.  - Continued anastrozole  therapy. - Reviewed recent normal mammogram and blood counts. - Scheduled next oncology follow-up in six months.  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 - Provided reassurance regarding musculoskeletal nature of symptoms. - Encouraged continuation of monthly lymphatic massage and staying active.  RTC as recommended in 6 months Time spent: 30 min  *Total Encounter Time as defined by the Centers for Medicare and Medicaid Services includes, in addition to the face-to-face time of a patient visit (documented in the note above) non-face-to-face time: obtaining and reviewing outside history, ordering and reviewing medications, tests or procedures, care coordination (communications with other health care professionals or caregivers) and documentation in the medical record.

## 2024-04-26 ENCOUNTER — Ambulatory Visit

## 2024-05-03 ENCOUNTER — Ambulatory Visit

## 2024-05-12 ENCOUNTER — Ambulatory Visit: Admitting: Allergy

## 2024-10-04 ENCOUNTER — Ambulatory Visit: Attending: General Surgery

## 2024-10-12 ENCOUNTER — Inpatient Hospital Stay: Admitting: Hematology and Oncology
